# Patient Record
Sex: Female | Born: 1945
Health system: Southern US, Community
[De-identification: ages and names within clinical notes are randomized; demographics above are authoritative.]

## PROBLEM LIST (undated history)

## (undated) DIAGNOSIS — E119 Type 2 diabetes mellitus without complications: Secondary | ICD-10-CM

## (undated) DIAGNOSIS — C801 Malignant (primary) neoplasm, unspecified: Secondary | ICD-10-CM

## (undated) DIAGNOSIS — I10 Essential (primary) hypertension: Secondary | ICD-10-CM

## (undated) HISTORY — DX: Essential (primary) hypertension: I10

## (undated) HISTORY — PX: ABDOMINAL HYSTERECTOMY: SHX81

## (undated) HISTORY — PX: OOPHORECTOMY: SHX86

## (undated) HISTORY — DX: Type 2 diabetes mellitus without complications: E11.9

---

## 2001-06-07 ENCOUNTER — Ambulatory Visit (HOSPITAL_COMMUNITY): Admission: RE | Admit: 2001-06-07 | Discharge: 2001-06-07 | Payer: Self-pay | Admitting: Family Medicine

## 2001-06-07 ENCOUNTER — Encounter: Payer: Self-pay | Admitting: Family Medicine

## 2002-03-15 ENCOUNTER — Ambulatory Visit (HOSPITAL_COMMUNITY): Admission: RE | Admit: 2002-03-15 | Discharge: 2002-03-15 | Payer: Self-pay | Admitting: General Surgery

## 2003-01-21 ENCOUNTER — Ambulatory Visit (HOSPITAL_COMMUNITY): Admission: RE | Admit: 2003-01-21 | Discharge: 2003-01-21 | Payer: Self-pay | Admitting: Family Medicine

## 2003-01-21 ENCOUNTER — Encounter: Payer: Self-pay | Admitting: Family Medicine

## 2006-02-22 ENCOUNTER — Emergency Department (HOSPITAL_COMMUNITY): Admission: EM | Admit: 2006-02-22 | Discharge: 2006-02-23 | Payer: Self-pay | Admitting: Emergency Medicine

## 2006-11-02 ENCOUNTER — Ambulatory Visit (HOSPITAL_COMMUNITY): Admission: RE | Admit: 2006-11-02 | Discharge: 2006-11-02 | Payer: Self-pay | Admitting: Family Medicine

## 2006-11-10 ENCOUNTER — Ambulatory Visit (HOSPITAL_COMMUNITY): Admission: RE | Admit: 2006-11-10 | Discharge: 2006-11-10 | Payer: Self-pay | Admitting: Family Medicine

## 2006-12-07 ENCOUNTER — Ambulatory Visit (HOSPITAL_COMMUNITY): Admission: RE | Admit: 2006-12-07 | Discharge: 2006-12-07 | Payer: Self-pay | Admitting: General Surgery

## 2010-04-08 ENCOUNTER — Ambulatory Visit (HOSPITAL_COMMUNITY): Admission: RE | Admit: 2010-04-08 | Discharge: 2010-04-08 | Payer: Self-pay | Admitting: Family Medicine

## 2010-08-17 ENCOUNTER — Other Ambulatory Visit (HOSPITAL_COMMUNITY): Payer: Self-pay | Admitting: Nephrology

## 2010-08-17 DIAGNOSIS — N289 Disorder of kidney and ureter, unspecified: Secondary | ICD-10-CM

## 2010-09-02 ENCOUNTER — Ambulatory Visit (HOSPITAL_COMMUNITY)
Admission: RE | Admit: 2010-09-02 | Discharge: 2010-09-02 | Disposition: A | Discharge status: Home / Self Care | Payer: BC Managed Care – PPO | Source: Ambulatory Visit | Attending: Nephrology | Admitting: Nephrology

## 2010-09-02 DIAGNOSIS — N289 Disorder of kidney and ureter, unspecified: Secondary | ICD-10-CM | POA: Insufficient documentation

## 2010-10-05 NOTE — H&P (Signed)
[name], [name]                 ACCOUNT NO.:  [id number]   MEDICAL RECORD NO.:  [id number]          PATIENT TYPE:  AMB   LOCATION:                                FACILITY:  [facility]   PHYSICIAN:  Jamesetta So, M.D.  DATE OF BIRTH:  1945-12-16   DATE OF ADMISSION:  12/07/2006  DATE OF DISCHARGE:  LH                              HISTORY & PHYSICAL   CHIEF COMPLAINT:  Family history of colon carcinoma.   HISTORY OF PRESENT ILLNESS:  The patient is a 66 year old white female  who is referred for endoscopic evaluation.  She needs a colonoscopy due  to family history of colon carcinoma in her mother.  Her mother has  since passed away from other causes.  No abdominal pain, weight loss,  nausea, vomiting, diarrhea, constipation, melena, hematochezia have been  noted.  She has never had a colonoscopy.   PAST MEDICAL HISTORY:  1. Hypertension.  2. Non-insulin dependent diabetes mellitus.  3. Hypothyroidism.   PAST SURGICAL HISTORY:  1. Excision of lipoma, coccyx.  2. Hysterectomy with bilateral salpingo-oophorectomy.   CURRENT MEDICATIONS:  Levoxyl, Ziac, Avandia, Glucovance, bisoprolol,  Diovan.   ALLERGIES:  No known drug allergies.   REVIEW OF SYSTEMS:  Noncontributory.   PHYSICAL EXAMINATION:  GENERAL:  The patient is a well-developed, well-  nourished white female in no acute distress.  LUNGS:  Clear to auscultation with equal breath sounds bilaterally.  HEART:  Examination reveals regular rate and rhythm without S3, S4, or  murmurs.  ABDOMEN:  Soft, nontender, nondistended.  No hepatosplenomegaly or  masses noted.  RECTAL:  Examination was deferred to procedure.   IMPRESSION:  Family history of colon carcinoma.   PLAN:  The patient is scheduled for colonoscopy on July, 17, 2008.  The  risks and benefits of the procedure including bleeding and perforation  were fully explained to the patient, gave informed consent.      Jamesetta So, M.D.  Electronically  Signed     MAJ/MEDQ  D:  11/14/2006  T:  11/14/2006  Job:  [id number]   cc:   [facility]  Fax: [phone]   Bonne Dolores, M.D.  Fax: [phone]

## 2010-10-08 NOTE — H&P (Signed)
   NAME:  Courtney [name], Courtney [name] NO.:  [id number]   MEDICAL RECORD NO.:  [id number]                  PATIENT TYPE:   LOCATION:                                       FACILITY:   PHYSICIAN:  Jamesetta So, M.D.               DATE OF BIRTH:  23-Jan-1946   DATE OF ADMISSION:  03/15/2002  DATE OF DISCHARGE:                                HISTORY & PHYSICAL   CHIEF COMPLAINT:  Recurrent lower back mass.   HISTORY OF PRESENT ILLNESS:  The patient is a 65 year old white female who  is referred for evaluation and treatment of a recurrent mass along the lower  portion of her back.  This is over the coccyx region.  She has had a lipoma  removed in the remote past, but the mass has recurred and is larger.  Occasional discomfort is noted.  No drainage has been noted.   PAST MEDICAL HISTORY:  Hypertension and non-insulin-dependent diabetes  mellitus.   PAST SURGICAL HISTORY:  Lipoma excision in the early 1990s, total abdominal  hysterectomy with bilateral salpingo-oophorectomy in the past.   CURRENT MEDICATIONS:  1. Ziac 5/6.25 mg p.o. q.d.  2. Diovan 325 mg p.o. q.d.  3. Glucovance 5/500 two tablets p.o. q.d.  4. Lipitor one tablet p.o. q.d.   ALLERGIES:  No known drug allergies.   REVIEW OF SYSTEMS:  The patient denies drinking or smoking.  She denies any  recent cardiopulmonary difficulties or bleeding disorders.   PHYSICAL EXAMINATION:  GENERAL:  The patient is a morbidly obese white  female in no acute distress.  VITAL SIGNS:  She is afebrile; vital signs are stable.  LUNGS:  Clear to auscultation with equal breath sounds bilaterally.  HEART:  Reveals a regular rate and rhythm without S3, S4, or murmurs.  BACK:  Reveals a large, ovoid, subcutaneous mass with several smaller masses  along the coccyx region.  The large mass is just superior and to the right  of the coccyx.   IMPRESSION:  Recurrent lipoma, back and coccyx region, enlarging.    PLAN:  The  patient is scheduled for excision of the mass, lower back, on  March 15, 2002.  The risks and benefits of the procedure including  bleeding, infection, recurrence of the mass were fully explained to the  patient, who gave informed consent.                                               Jamesetta So, M.D.    MAJ/MEDQ  D:  03/05/2002  T:  03/05/2002  Job:  [id number]   cc:   Leonides Grills, M.D.  [address] [place] [address]  Fax: [phone]

## 2010-10-08 NOTE — Op Note (Signed)
   NAME:  Courtney [name], Courtney [name]                           ACCOUNT NO.:  [id number]   MEDICAL RECORD NO.:  [id number]                   PATIENT TYPE:  AMB   LOCATION:  DAY                                  FACILITY:  [facility]   PHYSICIAN:  Jamesetta So, M.D.               DATE OF BIRTH:  [birth year]   DATE OF PROCEDURE:  03/15/2002  DATE OF DISCHARGE:                                 OPERATIVE REPORT   PREOPERATIVE DIAGNOSIS:  Recurrent lipoma, coccyx, enlarging.   POSTOPERATIVE DIAGNOSIS:  Recurrent lipoma, coccyx, enlarging.   PROCEDURE:  Excision of 8 cm recurrent lipoma, coccyx.   SURGEON:  Jamesetta So, M.D.   ANESTHESIA:  General endotracheal   INDICATIONS:  The patient is a 65 year old white female who is referred for  evaluation and treatment of a recurrent lipoma along the lower portion of  her back.  It appears to be septated in nature.  The patient now presents  for removal as the mass is enlarging.  The risks and benefits of the  procedure including bleeding, infection, and recurrent of the mass were  fully explained to the patient, who gave informed consent.   DESCRIPTION OF PROCEDURE:  The patient was placed in the right lateral  decubitus position after induction of general endotracheal anesthesia.  The  coccyx area was prepped and draped using the usual sterile technique with  Betadine.  Surgical site confirmation was performed.   An elliptical incision was made from the upper portion of the buttock crease  and over the coccyx region.  The extent was approximately 8 cm in size.  Multiple areas of lipomatous tissue were then excised without difficulty.  Any bleeding was controlled using Bovie electrocautery.  The wound was  irrigated with normal saline.  The subcutaneous layer was reapproximated  using a 3-0 Vicryl interrupted suture.  The skin was closed using 3-0 nylon  vertical mattress sutures.  Sensorcaine 0.5% was instilled into the  surrounding wound.  Betadine  ointment and dry sterile dressings were  applied.  All tape and needle counts were correct at the end of the  procedure.  The patient was extubated in the operating room and went back to  recovery room in awake and stable condition.   COMPLICATIONS:  None    SPECIMEN:  Lipoma, coccyx   BLOOD LOSS:  Minimal                                                Jamesetta So, M.D.    MAJ/MEDQ  D:  03/15/2002  T:  03/15/2002  Job:  [id number]   cc:   Leonides Grills, M.D.  [address] [place] [address]  Fax: [phone]

## 2011-04-19 ENCOUNTER — Other Ambulatory Visit (HOSPITAL_COMMUNITY): Payer: Self-pay | Admitting: Internal Medicine

## 2011-04-19 DIAGNOSIS — Z139 Encounter for screening, unspecified: Secondary | ICD-10-CM

## 2011-04-19 DIAGNOSIS — E119 Type 2 diabetes mellitus without complications: Secondary | ICD-10-CM

## 2011-04-22 ENCOUNTER — Ambulatory Visit (HOSPITAL_COMMUNITY)
Admission: RE | Admit: 2011-04-22 | Discharge: 2011-04-22 | Disposition: A | Discharge status: Home / Self Care | Payer: BC Managed Care – PPO | Source: Ambulatory Visit | Attending: Internal Medicine | Admitting: Internal Medicine

## 2011-04-22 DIAGNOSIS — E119 Type 2 diabetes mellitus without complications: Secondary | ICD-10-CM | POA: Insufficient documentation

## 2011-04-22 DIAGNOSIS — Z13820 Encounter for screening for osteoporosis: Secondary | ICD-10-CM | POA: Insufficient documentation

## 2011-04-22 DIAGNOSIS — Z139 Encounter for screening, unspecified: Secondary | ICD-10-CM

## 2011-04-22 DIAGNOSIS — Z780 Asymptomatic menopausal state: Secondary | ICD-10-CM | POA: Insufficient documentation

## 2011-05-10 ENCOUNTER — Other Ambulatory Visit (HOSPITAL_COMMUNITY): Payer: Self-pay | Admitting: Internal Medicine

## 2011-05-10 DIAGNOSIS — Z139 Encounter for screening, unspecified: Secondary | ICD-10-CM

## 2011-05-13 ENCOUNTER — Ambulatory Visit (HOSPITAL_COMMUNITY)
Admission: RE | Admit: 2011-05-13 | Discharge: 2011-05-13 | Disposition: A | Discharge status: Home / Self Care | Payer: BC Managed Care – PPO | Source: Ambulatory Visit | Attending: Internal Medicine | Admitting: Internal Medicine

## 2011-05-13 DIAGNOSIS — Z139 Encounter for screening, unspecified: Secondary | ICD-10-CM

## 2011-05-13 DIAGNOSIS — Z1231 Encounter for screening mammogram for malignant neoplasm of breast: Secondary | ICD-10-CM | POA: Insufficient documentation

## 2013-01-10 ENCOUNTER — Other Ambulatory Visit (HOSPITAL_COMMUNITY): Payer: Self-pay | Admitting: Family Medicine

## 2013-01-10 DIAGNOSIS — Z139 Encounter for screening, unspecified: Secondary | ICD-10-CM

## 2013-01-11 ENCOUNTER — Ambulatory Visit (HOSPITAL_COMMUNITY)
Admission: RE | Admit: 2013-01-11 | Discharge: 2013-01-11 | Disposition: A | Discharge status: Home / Self Care | Payer: Managed Care, Other (non HMO) | Source: Ambulatory Visit | Attending: Family Medicine | Admitting: Family Medicine

## 2013-01-11 DIAGNOSIS — Z1231 Encounter for screening mammogram for malignant neoplasm of breast: Secondary | ICD-10-CM | POA: Insufficient documentation

## 2013-01-11 DIAGNOSIS — Z139 Encounter for screening, unspecified: Secondary | ICD-10-CM

## 2013-01-23 ENCOUNTER — Ambulatory Visit (INDEPENDENT_AMBULATORY_CARE_PROVIDER_SITE_OTHER): Payer: Managed Care, Other (non HMO)

## 2013-01-23 ENCOUNTER — Ambulatory Visit (INDEPENDENT_AMBULATORY_CARE_PROVIDER_SITE_OTHER): Payer: Managed Care, Other (non HMO) | Admitting: Orthopedic Surgery

## 2013-01-23 ENCOUNTER — Encounter: Payer: Self-pay | Admitting: Orthopedic Surgery

## 2013-01-23 VITALS — BP 139/78 | Ht 61.0 in | Wt 212.0 lb

## 2013-01-23 DIAGNOSIS — M25562 Pain in left knee: Secondary | ICD-10-CM

## 2013-01-23 DIAGNOSIS — M1710 Unilateral primary osteoarthritis, unspecified knee: Secondary | ICD-10-CM

## 2013-01-23 DIAGNOSIS — M25569 Pain in unspecified knee: Secondary | ICD-10-CM

## 2013-01-23 DIAGNOSIS — M1712 Unilateral primary osteoarthritis, left knee: Secondary | ICD-10-CM | POA: Insufficient documentation

## 2013-01-23 MED ORDER — DICLOFENAC POTASSIUM 50 MG PO TABS: 50.0000 mg | ORAL_TABLET | Freq: Two times a day (BID) | ORAL | Status: DC

## 2013-01-23 MED ORDER — ACETAMINOPHEN-CODEINE #3 300-30 MG PO TABS: 1.0000 | ORAL_TABLET | Freq: Four times a day (QID) | ORAL | Status: DC | PRN

## 2013-01-23 NOTE — Progress Notes (Signed)
Patient ID: Courtney [name], female   DOB: [birth year], 67 y.o.   MRN: [id number] Chief Complaint  Patient presents with  . Knee Pain    Left knee pain   HISTORY: This is a 67 year old female who had a knee arthroscopy in the 90s on her left knee now complains of ongoing left knee pain for the last 14 years. She complains of dull throbbing 3-4/10 constant pain somewhat improved with Aleve. She has difficulty walking and difficulty going up and down the stairs the knees with swelling times. She notices popping catching anterior and posterior joint pain with decreased walking speed and difficulty getting out of a chair. Her review of systems seasonal allergies all other systems were reviewed were negative.  Past Medical History  Diagnosis Date  . Diabetes   . Hypertension    Past Surgical History  Procedure Laterality Date  . Abdominal hysterectomy    . Oophorectomy     The past, family history and social history have been reviewed and are recorded in the corresponding sections of epic   VS BP 139/78  Ht 5\' 1"  (1.549 m)  Wt 212 lb (96.163 kg)  BMI 40.08 kg/m2  General and hygiene are normal. Development and nutrition are normal. Body habitus obesity Mood Affect are normal The patient is alert and oriented x3 Ambulatory status no assistive devices but she does have an antalgic gait  RUE (include skin) Inspection reveals no tenderness or swelling,  range of motion is normal. The joints are located without subluxation or laxity. Motor exam reveals grade 5 strength and the skin is without rash lesion or ulcer  LUE Inspection reveals no tenderness or swelling,  range of motion is normal. The joints are located without subluxation or laxity. Motor exam reveals grade 5 strength;  skin is without rash lesion or ulcer  RLE Inspection reveals mild lateral joint line tenderness with continued functional range of motion. No subluxation or laxity strength normal skin without rash she does have  some lower extremity tenderness and ankle swelling   LLE Inspection reveals medial and lateral joint line tenderness no effusion functional range of motion no laxity skin normal without rash although there is some tenderness, strength normal   CDV peripheral pulses are intact without swelling or varicose veins  LYMPH are normal in all 4 extremities with no palpable nodes  DTR are equal and symmetric Balance  is normal  X-ray of the left knee show severe arthritis medial compartment  Osteoarthritis left knee  Inject left knee Start arthritis medication Use Tylenol with codeine for pain Patient education  Return 6 weeks  Knee  Injection Procedure Note  Pre-operative Diagnosis: left knee oa  Post-operative Diagnosis: same  Indications: pain  Anesthesia: ethyl chloride   Procedure Details   Verbal consent was obtained for the procedure. Time out was completed.The joint was prepped with alcohol, followed by  Ethyl chloride spray and A 20 gauge needle was inserted into the knee via lateral approach; 62ml 1% lidocaine and 1 ml of depomedrol  was then injected into the joint . The needle was removed and the area cleansed and dressed.  Complications:  None; patient tolerated the procedure well.

## 2013-01-23 NOTE — Patient Instructions (Addendum)
You have received a steroid shot. 15% of patients experience increased pain at the injection site with in the next 24 hours. This is best treated with ice and tylenol extra strength 2 tabs every 8 hours. If you are still having pain please call the office.   Wear and Tear Disorders of the Knee (Arthritis, Osteoarthritis) Everyone will experience wear and tear injuries (arthritis, osteoarthritis) of the knee. These are the changes we all get as we age. They come from the joint stress of daily living. The amount of cartilage damage in your knee and your symptoms determine if you need surgery. Mild problems require approximately two months recovery time. More severe problems take several months to recover. With mild problems, your surgeon may find worn and rough cartilage surfaces. With severe changes, your surgeon may find cartilage that has completely worn away and exposed the bone. Loose bodies of bone and cartilage, bone spurs (excess bone growth), and injuries to the menisci (cushions between the large bones of your leg) are also common. All of these problems can cause pain. For a mild wear and tear problem, rough cartilage may simply need to be shaved and smoothed. For more severe problems with areas of exposed bone, your surgeon may use an instrument for roughing up the bone surfaces to stimulate new cartilage growth. Loose bodies are usually removed. Torn menisci may be trimmed or repaired. ABOUT THE ARTHROSCOPIC PROCEDURE Arthroscopy is a surgical technique. It allows your orthopedic surgeon to diagnose and treat your knee injury with accuracy. The surgeon looks into your knee through a small scope. The scope is like a small (pencil-sized) telescope. Arthroscopy is less invasive than open knee surgery. You can expect a more rapid recovery. After the procedure, you will be moved to a recovery area until most of the effects of the medication have worn off. Your caregiver will discuss the test results with  you. RECOVERY The severity of the arthritis and the type of procedure performed will determine recovery time. Other important factors include age, physical condition, medical conditions, and the type of rehabilitation program. Strengthening your muscles after arthroscopy helps guarantee a better recovery. Follow your caregiver's instructions. Use crutches, rest, elevate, ice, and do knee exercises as instructed. Your caregivers will help you and instruct you with exercises and other physical therapy required to regain your mobility, muscle strength, and functioning following surgery. Only take over-the-counter or prescription medicines for pain, discomfort, or fever as directed by your caregiver.  SEEK MEDICAL CARE IF:   There is increased bleeding (more than a small spot) from the wound.  You notice redness, swelling, or increasing pain in the wound.  Pus is coming from wound.  You develop an unexplained oral temperature above 102 F (38.9 C) , or as your caregiver suggests.  You notice a foul smell coming from the wound or dressing.  You have severe pain with motion of the knee. SEEK IMMEDIATE MEDICAL CARE IF:   You develop a rash.  You have difficulty breathing.  You have any allergic problems. MAKE SURE YOU:   Understand these instructions.  Will watch your condition.  Will get help right away if you are not doing well or get worse. Document Released: 05/06/2000 Document Revised: 08/01/2011 Document Reviewed: 10/03/2007 [facility] Patient Information 2014 [place], [place].

## 2013-03-07 ENCOUNTER — Ambulatory Visit: Payer: Managed Care, Other (non HMO) | Admitting: Orthopedic Surgery

## 2013-03-14 ENCOUNTER — Ambulatory Visit (INDEPENDENT_AMBULATORY_CARE_PROVIDER_SITE_OTHER): Payer: Managed Care, Other (non HMO) | Admitting: Orthopedic Surgery

## 2013-03-14 ENCOUNTER — Encounter: Payer: Self-pay | Admitting: Orthopedic Surgery

## 2013-03-14 VITALS — BP 130/77 | Ht 61.0 in | Wt 212.0 lb

## 2013-03-14 DIAGNOSIS — M1710 Unilateral primary osteoarthritis, unspecified knee: Secondary | ICD-10-CM

## 2013-03-14 DIAGNOSIS — M25569 Pain in unspecified knee: Secondary | ICD-10-CM

## 2013-03-14 DIAGNOSIS — M25562 Pain in left knee: Secondary | ICD-10-CM

## 2013-03-14 DIAGNOSIS — M1712 Unilateral primary osteoarthritis, left knee: Secondary | ICD-10-CM

## 2013-03-14 MED ORDER — ACETAMINOPHEN-CODEINE #3 300-30 MG PO TABS: 1.0000 | ORAL_TABLET | Freq: Four times a day (QID) | ORAL | Status: DC | PRN

## 2013-03-14 NOTE — Progress Notes (Signed)
Patient ID: Courtney [name], female   DOB: 1946/04/03, 67 y.o.   MRN: [id number]  Chief Complaint  Patient presents with  . Follow-up    6 week recheck left knee s/p injection    This patient had an injection in her left knee she reports significant improvement although not complete resolution of pain.  Review of systems is negative for any knee related complaints  Blood pressure 130/77, height 5\' 1"  (1.549 m), weight 212 lb (96.163 kg). General appearance is normal, the patient is alert and oriented x3 with normal mood and affect. The left knee is nontender today her range of motion is approximately 115, her thigh circumference blocks flexion. Her knee is stable.  The distal swelling is still present but without redness  She will continue on diclofenac and Tylenol #3 followup as needed for injections as needed

## 2013-05-23 DIAGNOSIS — F0280 Dementia in other diseases classified elsewhere without behavioral disturbance: Secondary | ICD-10-CM

## 2013-05-23 HISTORY — DX: Dementia in other diseases classified elsewhere, unspecified severity, without behavioral disturbance, psychotic disturbance, mood disturbance, and anxiety: F02.80

## 2013-06-24 ENCOUNTER — Other Ambulatory Visit: Payer: Self-pay | Admitting: *Deleted

## 2013-06-24 DIAGNOSIS — M25562 Pain in left knee: Secondary | ICD-10-CM

## 2013-06-24 MED ORDER — DICLOFENAC POTASSIUM 50 MG PO TABS: 50.0000 mg | ORAL_TABLET | Freq: Two times a day (BID) | ORAL | Status: DC

## 2014-04-24 ENCOUNTER — Emergency Department (HOSPITAL_COMMUNITY)
Admission: EM | Admit: 2014-04-24 | Discharge: 2014-04-24 | Disposition: A | Discharge status: Home / Self Care | Payer: Managed Care, Other (non HMO) | Attending: Emergency Medicine | Admitting: Emergency Medicine

## 2014-04-24 ENCOUNTER — Emergency Department (HOSPITAL_COMMUNITY): Payer: Managed Care, Other (non HMO)

## 2014-04-24 ENCOUNTER — Encounter (HOSPITAL_COMMUNITY): Payer: Self-pay | Admitting: Cardiology

## 2014-04-24 DIAGNOSIS — R4182 Altered mental status, unspecified: Secondary | ICD-10-CM | POA: Diagnosis present

## 2014-04-24 DIAGNOSIS — Z791 Long term (current) use of non-steroidal anti-inflammatories (NSAID): Secondary | ICD-10-CM | POA: Insufficient documentation

## 2014-04-24 DIAGNOSIS — Z79899 Other long term (current) drug therapy: Secondary | ICD-10-CM | POA: Insufficient documentation

## 2014-04-24 DIAGNOSIS — E119 Type 2 diabetes mellitus without complications: Secondary | ICD-10-CM | POA: Diagnosis not present

## 2014-04-24 DIAGNOSIS — R413 Other amnesia: Secondary | ICD-10-CM | POA: Diagnosis not present

## 2014-04-24 DIAGNOSIS — Z794 Long term (current) use of insulin: Secondary | ICD-10-CM | POA: Insufficient documentation

## 2014-04-24 DIAGNOSIS — I10 Essential (primary) hypertension: Secondary | ICD-10-CM | POA: Diagnosis not present

## 2014-04-24 DIAGNOSIS — D649 Anemia, unspecified: Secondary | ICD-10-CM | POA: Diagnosis not present

## 2014-04-24 LAB — CBC WITH DIFFERENTIAL/PLATELET
Basophils Absolute: 0.1 10*3/uL (ref 0.0–0.1)
Basophils Relative: 1 % (ref 0–1)
EOS PCT: 2 % (ref 0–5)
Eosinophils Absolute: 0.2 10*3/uL (ref 0.0–0.7)
HEMATOCRIT: 30.3 % — AB (ref 36.0–46.0)
Hemoglobin: 9.6 g/dL — ABNORMAL LOW (ref 12.0–15.0)
LYMPHS ABS: 1.7 10*3/uL (ref 0.7–4.0)
LYMPHS PCT: 20 % (ref 12–46)
MCH: 26.2 pg (ref 26.0–34.0)
MCHC: 31.7 g/dL (ref 30.0–36.0)
MCV: 82.8 fL (ref 78.0–100.0)
MONO ABS: 0.4 10*3/uL (ref 0.1–1.0)
Monocytes Relative: 5 % (ref 3–12)
Neutro Abs: 6.1 10*3/uL (ref 1.7–7.7)
Neutrophils Relative %: 72 % (ref 43–77)
PLATELETS: 267 10*3/uL (ref 150–400)
RBC: 3.66 MIL/uL — AB (ref 3.87–5.11)
RDW: 15.7 % — ABNORMAL HIGH (ref 11.5–15.5)
WBC: 8.5 10*3/uL (ref 4.0–10.5)

## 2014-04-24 LAB — COMPREHENSIVE METABOLIC PANEL
ALT: 19 U/L (ref 0–35)
ANION GAP: 12 (ref 5–15)
AST: 24 U/L (ref 0–37)
Albumin: 3.8 g/dL (ref 3.5–5.2)
Alkaline Phosphatase: 62 U/L (ref 39–117)
BILIRUBIN TOTAL: 0.2 mg/dL — AB (ref 0.3–1.2)
BUN: 25 mg/dL — ABNORMAL HIGH (ref 6–23)
CALCIUM: 8.9 mg/dL (ref 8.4–10.5)
CHLORIDE: 102 meq/L (ref 96–112)
CO2: 25 meq/L (ref 19–32)
Creatinine, Ser: 1.18 mg/dL — ABNORMAL HIGH (ref 0.50–1.10)
GFR, EST AFRICAN AMERICAN: 54 mL/min — AB (ref >90)
GFR, EST NON AFRICAN AMERICAN: 46 mL/min — AB (ref >90)
GLUCOSE: 179 mg/dL — AB (ref 70–99)
Potassium: 4.7 mEq/L (ref 3.7–5.3)
SODIUM: 139 meq/L (ref 137–147)
Total Protein: 7.2 g/dL (ref 6.0–8.3)

## 2014-04-24 LAB — AMMONIA: AMMONIA: 28 umol/L (ref 11–60)

## 2014-04-24 LAB — URINE MICROSCOPIC-ADD ON

## 2014-04-24 LAB — URINALYSIS, ROUTINE W REFLEX MICROSCOPIC
Bilirubin Urine: NEGATIVE
GLUCOSE, UA: NEGATIVE mg/dL
Ketones, ur: NEGATIVE mg/dL
NITRITE: NEGATIVE
PH: 7 (ref 5.0–8.0)
Protein, ur: NEGATIVE mg/dL
SPECIFIC GRAVITY, URINE: 1.02 (ref 1.005–1.030)
Urobilinogen, UA: 0.2 mg/dL (ref 0.0–1.0)

## 2014-04-24 LAB — CBG MONITORING, ED: Glucose-Capillary: 188 mg/dL — ABNORMAL HIGH (ref 70–99)

## 2014-04-24 LAB — TROPONIN I

## 2014-04-24 NOTE — ED Notes (Signed)
Neuro exam normal.  Alert and oriented.   When speaking with pt ,  She says random things.

## 2014-04-24 NOTE — Discharge Instructions (Signed)
Please call your doctor for a followup appointment within 24-48 hours. When you talk to your doctor please let them know that you were seen in the emergency department and have them acquire all of your records so that they can discuss the findings with you and formulate a treatment plan to fully care for your new and ongoing problems. ° °

## 2014-04-24 NOTE — ED Notes (Addendum)
Pt has been disoriented since last night.   Seen at [facility] Tuesday for a regular check up and was told her thyroid function was really high.   Received flu and pneumonia  Vaccine  Tuesday.

## 2014-04-24 NOTE — ED Provider Notes (Signed)
CSN: [id number]     Arrival date & time 04/24/14  1223 History  This chart was scribed for Courtney Acosta, MD by Courtney [name], ED Scribe. This patient was seen in room APA09/APA09 and the patient's care was started at 1:05 PM.    Chief Complaint  Patient presents with  . Altered Mental Status   The history is provided by the patient, the spouse and a relative. No language interpreter was used.     HPI Comments: Courtney [name] is a 68 y.o. female who presents to the Emergency Department complaining of altered mental status; confusion and memory loss, seemingly worsening over the past few days. Patient reports tingling and itching in her hands.  Ambulating without issue. She denies fevers, chills, nausea, vomiting, rashes.   Relative explains that patient had prior thyroid complications and was placed on meds for this issue 1 year PTA. Her memory loss and confusion has increased over the past year; PCP upped thyroid med dosage recently. Her memory and confusion has worsened acutely over the past few days. Husband reports an incident of abnormal behavior this morning: the patient was urinating in the floor and around the toilet, looking at her husband as though she didn't recognize him, refused to dress, unable to recognize the christmas tree or other normal household items.   She is currently taking Synthroid 125mg ; she does not take this every day. Last filled mid November.   PCP is Courtney Morel, MD  Past Medical History  Diagnosis Date  . Diabetes   . Hypertension    Past Surgical History  Procedure Laterality Date  . Abdominal hysterectomy    . Oophorectomy     History reviewed. No pertinent family history. History  Substance Use Topics  . Smoking status: Unknown If Ever Smoked  . Smokeless tobacco: Not on file  . Alcohol Use: No   OB History    No data available     Review of Systems  Neurological:       Memory loss and abnormal behavior   All other systems  reviewed and are negative.   Allergies  Review of patient's allergies indicates no known allergies.  Home Medications   Prior to Admission medications   Medication Sig Start Date End Date Taking? Authorizing Provider  amLODipine (NORVASC) 10 MG tablet Take 5 mg by mouth daily.    Yes Historical Provider, MD  Aspirin-Salicylamide-Caffeine (BC HEADACHE POWDER PO) Take 2 Packages by mouth daily as needed (headache).   Yes Historical Provider, MD  baclofen (LIORESAL) 20 MG tablet Take 20 mg by mouth at bedtime as needed for muscle spasms.   Yes Historical Provider, MD  DiphenhydrAMINE HCl [facility] RELIEF EX) Apply 1 application topically daily as needed (itching).   Yes Historical Provider, MD  furosemide (LASIX) 20 MG tablet Take 20 mg by mouth.   Yes Historical Provider, MD  insulin glargine (LANTUS) 100 UNIT/ML injection Inject 60 Units into the skin at bedtime.    Yes Historical Provider, MD  levothyroxine (SYNTHROID, LEVOTHROID) 125 MCG tablet Take 125 mcg by mouth daily before breakfast.   Yes Historical Provider, MD  naproxen sodium (ANAPROX) 220 MG tablet Take 440 mg by mouth daily as needed (headache).   Yes Historical Provider, MD  simvastatin (ZOCOR) 10 MG tablet Take 10 mg by mouth at bedtime.   Yes Historical Provider, MD  sitaGLIPtin-metformin (JANUMET) 50-1000 MG per tablet Take 1 tablet by mouth 2 (two) times daily with a meal.  Yes Historical Provider, MD  valsartan (DIOVAN) 320 MG tablet Take 320 mg by mouth daily.   Yes Historical Provider, MD  acetaminophen-codeine (TYLENOL #3) 300-30 MG per tablet Take 1 tablet by mouth every 6 (six) hours as needed for pain. Patient not taking: Reported on 04/24/2014 03/14/13   Carole Civil, MD  bisoprolol-hydrochlorothiazide [facility]) 2.5-6.25 MG per tablet Take 1 tablet by mouth daily.    Historical Provider, MD  diclofenac (CATAFLAM) 50 MG tablet Take 1 tablet (50 mg total) by mouth 2 (two) times daily. Patient not taking: Reported on  04/24/2014 06/24/13   Carole Civil, MD   BP 149/73 mmHg  Pulse 90  Temp(Src) 98.6 F (37 C) (Oral)  Resp 20  Ht 5\' 2"  (1.575 m)  Wt 230 lb (104.327 kg)  BMI 42.06 kg/m2  SpO2 97% Physical Exam  Constitutional: She is oriented to person, place, and time. She appears well-developed and well-nourished. No distress.  Tearful  HENT:  Head: Normocephalic and atraumatic.  Mouth/Throat: Oropharynx is clear and moist. No oropharyngeal exudate.  Eyes: Conjunctivae and EOM are normal. Pupils are equal, round, and reactive to light. Right eye exhibits no discharge. Left eye exhibits no discharge. No scleral icterus.  Neck: Normal range of motion. Neck supple. No JVD present. No tracheal deviation present. No thyromegaly present.  Cardiovascular: Normal rate, regular rhythm, normal heart sounds and intact distal pulses.  Exam reveals no gallop and no friction rub.   No murmur heard. Pulmonary/Chest: Effort normal and breath sounds normal. No respiratory distress. She has no wheezes. She has no rales.  Abdominal: Soft. Bowel sounds are normal. She exhibits no distension and no mass. There is no tenderness.  Musculoskeletal: Normal range of motion. She exhibits no edema or tenderness.  Lymphadenopathy:    She has no cervical adenopathy.  Neurological: She is alert and oriented to person, place, and time. Coordination normal.  Has difficulty answering basic questions but follows commands perfectly.  She has normal coordination, she has normal CN 3-12 and normal strength in all 4 extremities without tremor - she is able to tell me her name, the month and location and birthday.  Skin: Skin is warm and dry. No rash noted. No erythema.  Psychiatric: She has a normal mood and affect. Her behavior is normal.  Nursing note and vitals reviewed.   ED Course  Procedures   DIAGNOSTIC STUDIES: Oxygen Saturation is 97% on RA, adequate by my interpretation.    COORDINATION OF CARE: 1:15 PM Discussed  treatment plan with pt at bedside and pt agreed to plan.   Labs Review Labs Reviewed  CBC WITH DIFFERENTIAL - Abnormal; Notable for the following:    RBC 3.66 (*)    Hemoglobin 9.6 (*)    HCT 30.3 (*)    RDW 15.7 (*)    All other components within normal limits  COMPREHENSIVE METABOLIC PANEL - Abnormal; Notable for the following:    Glucose, Bld 179 (*)    BUN 25 (*)    Creatinine, Ser 1.18 (*)    Total Bilirubin 0.2 (*)    GFR calc non Af Amer 46 (*)    GFR calc Af Amer 54 (*)    All other components within normal limits  URINALYSIS, ROUTINE W REFLEX MICROSCOPIC - Abnormal; Notable for the following:    Hgb urine dipstick TRACE (*)    Leukocytes, UA TRACE (*)    All other components within normal limits  URINE MICROSCOPIC-ADD ON - Abnormal; Notable  for the following:    Squamous Epithelial / LPF FEW (*)    All other components within normal limits  CBG MONITORING, ED - Abnormal; Notable for the following:    Glucose-Capillary 188 (*)    All other components within normal limits  AMMONIA  TROPONIN I  TSH  CBG MONITORING, ED    Imaging Review Ct Head Wo Contrast  04/24/2014   CLINICAL DATA:  Altered mental status since last night.  EXAM: CT HEAD WITHOUT CONTRAST  TECHNIQUE: Contiguous axial images were obtained from the base of the skull through the vertex without intravenous contrast.  COMPARISON:  None.  FINDINGS: No mass lesion. No midline shift. No acute hemorrhage or hematoma. No extra-axial fluid collections. No evidence of acute infarction. Brain parenchyma is normal. Osseous structures are normal.  IMPRESSION: Normal exam.   Electronically Signed   By: Rozetta Nunnery M.D.   On: 04/24/2014 14:21     EKG Interpretation   Date/Time:  Thursday April 24 2014 13:24:29 EST Ventricular Rate:  83 PR Interval:  137 QRS Duration: 78 QT Interval:  386 QTC Calculation: 453 R Axis:   69 Text Interpretation:  Sinus rhythm Low voltage, precordial leads ECG  OTHERWISE WITHIN  NORMAL LIMITS No old tracing to compare Confirmed by  Hammad Finkler  MD, Earnie Rockhold ([id number]) on 04/24/2014 2:04:46 PM      MDM   Final diagnoses:  Memory loss  Anemia, unspecified anemia type    Labs have been very reassuring, she has a normal head CT and ECG without acute findings - she has had a stable exam while here without any findings to suggest need for emergent w/u or admission - I have discussed the pt's care with her PMD - Dr. Ethlyn Gallery, Kassie Mends, MD Who has agreed to make a neurology referral and to follow up with her in the office - she reports the TSH at 9 which was drawn this week.  Her condition is not related to her thyroid.  familiy and pt agreeable to plan.  Meds given in ED:  I personally performed the services described in this documentation, which was scribed in my presence. The recorded information has been reviewed and is accurate.        Courtney Acosta, MD 04/24/14 [phone]

## 2014-04-25 LAB — TSH: TSH: 6.24 u[IU]/mL — ABNORMAL HIGH (ref 0.350–4.500)

## 2014-04-29 ENCOUNTER — Encounter: Payer: Self-pay | Admitting: Neurology

## 2014-04-29 ENCOUNTER — Ambulatory Visit (INDEPENDENT_AMBULATORY_CARE_PROVIDER_SITE_OTHER): Payer: Managed Care, Other (non HMO) | Admitting: Neurology

## 2014-04-29 VITALS — BP 160/96 | HR 84 | Temp 98.4°F | Resp 20 | Ht 60.0 in | Wt 226.1 lb

## 2014-04-29 DIAGNOSIS — R4189 Other symptoms and signs involving cognitive functions and awareness: Secondary | ICD-10-CM

## 2014-04-29 DIAGNOSIS — F482 Pseudobulbar affect: Secondary | ICD-10-CM

## 2014-04-29 DIAGNOSIS — R4182 Altered mental status, unspecified: Secondary | ICD-10-CM

## 2014-04-29 LAB — VITAMIN B12: VITAMIN B 12: 334 pg/mL (ref 211–911)

## 2014-04-29 NOTE — Patient Instructions (Addendum)
1.  We will check an MRI of the brain [place]  9:10 am  2.  We will check B12 and methylmalonic acid level 3.  We will check EEG 4.  If tests unremarkable, will likely get neuropsychological testing 5.  Follow up after neuropsychological testing or in 2 months if we don't pursue testing.

## 2014-04-29 NOTE — Progress Notes (Signed)
NEUROLOGY CONSULTATION NOTE  Courtney [name] MRN: [id number] DOB: [birth year]  Referring provider: Dr. Ethlyn Gallery Primary care provider: Dr. Ethlyn Gallery  Reason for consult:  Cognitive dysfunction  HISTORY OF PRESENT ILLNESS: Courtney [name] is a 68 year old right-handed woman with hypothyroidism, diabetes and hypertension who presents for cognitive impairment.  Records, labs and CT were reviewed.  She is accompanied by her husband who also provides some history.  She presented to the ED at [facility] on 04/24/14 for bizarre behavior.  She woke up that morning, later than usual.  She and her husband sleep in different bedrooms.  She went to her husband's bathroom to use the toilet, which was unusual.  The night before, they but up the Christmas tree and left the paper which wrapped the ornaments on the floor.  That morning, she asked him where the paper came from.  After she went back to her room, her husband later found her on the toilet but noticed she had urinated on the bedroom floor.  When he asked her what was wrong, she looked up at him confused and didn't know what he was taking about.   She does recall the events of this morning, but it is a little fuzzy.  She did not bite her tongue and there is no indication that she experienced convulsions.  Labs, including CBC, CMP, UA, troponins, and ammonia were unremarkable.  TSH was 6.240.  CT of the head was normal.    She has had a change in behavior over the past year.  She started feeling cold all the time.  This past spring, she ran out of Synthroid and didn't have it refilled.  She was found to have a very elevated TSH and her Synthroid was restarted with dose adjustment.  She also easily will start crying at any little thing.  She doesn't feel profoundly sad when it occurs.  She can't help it from happening.  Her husband was undergoing daily radiation treatments for prostate cancer.  One day, when he came home from a routine treatment, she asked him  where he was.  However, she maintains that she asked him only because it seemed like he got home later from the treatment than usual.  She does not have trouble recognizing faces but she sometimes has trouble recalling names.  She does not have hallucinations or delusions.  She is able to perform all of her ADLs.  She does report some depression, related to her son who is an alcoholic, but this is a longstanding problem.  She hasn't driven for several years due to anxiety.  Her granddaughter is irritated with her because she is unwilling to drive and pick up the great-granddaughter from daycare due to her anxiety.  She is currently at baseline.  She has no history of seizure or head injury.  She has no known family history of dementia.    PAST MEDICAL HISTORY: Past Medical History  Diagnosis Date  . Diabetes   . Hypertension     PAST SURGICAL HISTORY: Past Surgical History  Procedure Laterality Date  . Abdominal hysterectomy    . Oophorectomy      MEDICATIONS: Current Outpatient Prescriptions on File Prior to Visit  Medication Sig Dispense Refill  . acetaminophen-codeine (TYLENOL #3) 300-30 MG per tablet Take 1 tablet by mouth every 6 (six) hours as needed for pain. 60 tablet 0  . amLODipine (NORVASC) 10 MG tablet Take 5 mg by mouth daily.     . Aspirin-Salicylamide-Caffeine (  BC HEADACHE POWDER PO) Take 2 Packages by mouth daily as needed (headache).    . baclofen (LIORESAL) 20 MG tablet Take 20 mg by mouth at bedtime as needed for muscle spasms.    . bisoprolol-hydrochlorothiazide (ZIAC) 2.5-6.25 MG per tablet Take 1 tablet by mouth daily.    . diclofenac (CATAFLAM) 50 MG tablet Take 1 tablet (50 mg total) by mouth 2 (two) times daily. 90 tablet 5  . DiphenhydrAMINE HCl [facility] RELIEF EX) Apply 1 application topically daily as needed (itching).    . furosemide (LASIX) 20 MG tablet Take 20 mg by mouth.    . insulin glargine (LANTUS) 100 UNIT/ML injection Inject 60 Units into the skin at  bedtime.     Marland Kitchen levothyroxine (SYNTHROID, LEVOTHROID) 125 MCG tablet Take 125 mcg by mouth daily before breakfast.    . naproxen sodium (ANAPROX) 220 MG tablet Take 440 mg by mouth daily as needed (headache).    . simvastatin (ZOCOR) 10 MG tablet Take 10 mg by mouth at bedtime.    . sitaGLIPtin-metformin (JANUMET) 50-1000 MG per tablet Take 1 tablet by mouth 2 (two) times daily with a meal.    . valsartan (DIOVAN) 320 MG tablet Take 320 mg by mouth daily.     No current facility-administered medications on file prior to visit.    ALLERGIES: No Known Allergies  FAMILY HISTORY: Family History  Problem Relation Age of Onset  . Rheum arthritis Mother   . Heart failure Mother   . Hypertension Mother   . Stroke Father   . Diabetes Brother   . Diabetes Sister   . Hypertension Maternal Grandmother     SOCIAL HISTORY: History   Social History  . Marital Status: Married    Spouse Name: N/A    Number of Children: N/A  . Years of Education: N/A   Occupational History  . Not on file.   Social History Main Topics  . Smoking status: Former Research scientist (life sciences)  . Smokeless tobacco: Never Used  . Alcohol Use: 0.0 oz/week    0 Not specified per week     Comment: social  . Drug Use: No  . Sexual Activity: No   Other Topics Concern  . Not on file   Social History Narrative    REVIEW OF SYSTEMS: Constitutional: No fevers, chills, or sweats, no generalized fatigue, change in appetite Eyes: No visual changes, double vision, eye pain Ear, nose and throat: No hearing loss, ear pain, nasal congestion, sore throat Cardiovascular: No chest pain, palpitations Respiratory:  No shortness of breath at rest or with exertion, wheezes GastrointestinaI: No nausea, vomiting, diarrhea, abdominal pain, fecal incontinence Genitourinary:  No dysuria, urinary retention or frequency Musculoskeletal:  No neck pain, back pain Integumentary: No rash, pruritus, skin lesions Neurological: as above Psychiatric: No  depression, insomnia, anxiety Endocrine: No palpitations, fatigue, diaphoresis, mood swings, change in appetite, change in weight, increased thirst Hematologic/Lymphatic:  No anemia, purpura, petechiae. Allergic/Immunologic: no itchy/runny eyes, nasal congestion, recent allergic reactions, rashes  PHYSICAL EXAM: Filed Vitals:   04/29/14 0758  BP: 160/96  Pulse: 84  Temp: 98.4 F (36.9 C)  Resp: 20   General: No acute distress Head:  Normocephalic/atraumatic Eyes:  fundi unremarkable, without vessel changes, exudates, hemorrhages or papilledema. Neck: supple, no paraspinal tenderness, full range of motion Back: No paraspinal tenderness Heart: regular rate and rhythm Lungs: Clear to auscultation bilaterally. Vascular: No carotid bruits. Neurological Exam: Mental status: alert and oriented to person, place, and time, delayed recall poor,  remote memory intact, fund of knowledge intact, attention and concentration intact but could not perform serial 7 subtraction, unable to perform trail making test or copy a cube, able to draw a clock, speech fluent and not dysarthric, language intact although did not repeat exactly word-for-word. Montreal Cognitive Assessment  04/29/2014  Visuospatial/ Executive (0/5) 2  Naming (0/3) 3  Attention: Read list of digits (0/2) 2  Attention: Read list of letters (0/1) 1  Attention: Serial 7 subtraction starting at 100 (0/3) 1  Language: Repeat phrase (0/2) 0  Language : Fluency (0/1) 0  Abstraction (0/2) 1  Delayed Recall (0/5) 0  Orientation (0/6) 6  Total 16  Adjusted Score (based on education) 17   Cranial nerves: CN I: not tested CN II: pupils equal, round and reactive to light, visual fields intact, fundi unremarkable, without vessel changes, exudates, hemorrhages or papilledema. CN III, IV, VI:  full range of motion, no nystagmus, no ptosis CN V: facial sensation intact CN VII: upper and lower face symmetric CN VIII: hearing intact CN IX, X:  gag intact, uvula midline CN XI: sternocleidomastoid and trapezius muscles intact CN XII: tongue midline Bulk & Tone: normal, no fasciculations. Motor:  5/5 throughout Sensation:  Reduced temperature sensation in the feet.  Mildly reduced vibration sensation in the toes. Deep Tendon Reflexes:  2+ throughout except absent in the ankles.  Toes downgoing. Finger to nose testing:  No dysmetria Gait:  Normal station and stride.  Able to turn and walk in tandem although a little unsteady. Romberg negative.  IMPRESSION: Transient altered mental status.   Cognitive impairment Pseudobulbar affect  PLAN: 1.  MRI of brain  2.  B12 3.  EEG 4.  Will likely refer for formal neuropsychological testing following above tests  Thank you for allowing me to take part in the care of this patient.  Metta Clines, DO  CC: Micheline Rough, MD

## 2014-05-01 ENCOUNTER — Ambulatory Visit
Admission: RE | Admit: 2014-05-01 | Discharge: 2014-05-01 | Disposition: A | Discharge status: Home / Self Care | Payer: Medicare Other | Source: Ambulatory Visit | Attending: Neurology | Admitting: Neurology

## 2014-05-01 DIAGNOSIS — R4182 Altered mental status, unspecified: Secondary | ICD-10-CM

## 2014-05-01 DIAGNOSIS — R4189 Other symptoms and signs involving cognitive functions and awareness: Secondary | ICD-10-CM

## 2014-05-01 DIAGNOSIS — F482 Pseudobulbar affect: Secondary | ICD-10-CM

## 2014-05-02 LAB — METHYLMALONIC ACID, SERUM: Methylmalonic Acid, Quant: 204 nmol/L (ref 87–318)

## 2014-05-08 ENCOUNTER — Ambulatory Visit (INDEPENDENT_AMBULATORY_CARE_PROVIDER_SITE_OTHER): Payer: Managed Care, Other (non HMO) | Admitting: Neurology

## 2014-05-08 ENCOUNTER — Telehealth: Payer: Self-pay | Admitting: *Deleted

## 2014-05-08 DIAGNOSIS — F482 Pseudobulbar affect: Secondary | ICD-10-CM

## 2014-05-08 DIAGNOSIS — R4182 Altered mental status, unspecified: Secondary | ICD-10-CM

## 2014-05-08 DIAGNOSIS — R4189 Other symptoms and signs involving cognitive functions and awareness: Secondary | ICD-10-CM

## 2014-05-08 NOTE — Telephone Encounter (Signed)
Patient is aware of  normal EEG  she does not wish to do any futher testing at this time

## 2014-05-08 NOTE — Procedures (Signed)
ELECTROENCEPHALOGRAM REPORT  Date of Study: 05/07/2014  Patient's Name: Courtney [name] MRN: [id number] Date of Birth: 11/09/1945  Indication: Altered mental status.  Cognitive impairment.  Medications: Tylenol #3, baclofen, diclofenac, levothyroxine, Janumet, Diovan, Lasix, Ziac, Zocor  Technical Summary: This is a multichannel digital EEG recording, using the international 10-20 placement system.  Spike detection software was employed.  Description: The EEG background is symmetric, with a well-developed posterior dominant rhythm of 9 Hz, which is reactive to eye opening and closing.  Diffuse beta activity is seen, with a bilateral frontal preponderance.  No focal or generalized abnormalities are seen.  No focal or generalized epileptiform discharges are seen.  Stage II sleep is not seen.  Hyperventilation and photic stimulation were performed, and produced no abnormalities.  ECG revealed normal cardiac rate and rhythm.  Impression: This is a normal routine EEG of the awake and drowsy states, with activating procedures.  A normal study does not rule out the possibility of a seizure disorder in this patient.  Houda Brau R. Tomi Likens, DO

## 2014-05-08 NOTE — Telephone Encounter (Signed)
-----   Message from Dudley Major, DO sent at 05/08/2014  1:15 PM EST ----- The EEG is normal.  Since the tests are normal, as discussed, the next step would be neuropsychological testing.   ----- Message -----    From: Dudley Major, DO    Sent: 05/08/2014   1:10 PM      To: Dudley Major, DO

## 2015-02-17 ENCOUNTER — Encounter (INDEPENDENT_AMBULATORY_CARE_PROVIDER_SITE_OTHER): Payer: Self-pay | Admitting: *Deleted

## 2015-03-05 ENCOUNTER — Other Ambulatory Visit (INDEPENDENT_AMBULATORY_CARE_PROVIDER_SITE_OTHER): Payer: Self-pay | Admitting: *Deleted

## 2015-03-05 ENCOUNTER — Encounter (INDEPENDENT_AMBULATORY_CARE_PROVIDER_SITE_OTHER): Payer: Self-pay | Admitting: *Deleted

## 2015-03-05 DIAGNOSIS — Z1211 Encounter for screening for malignant neoplasm of colon: Secondary | ICD-10-CM

## 2015-03-24 ENCOUNTER — Telehealth (INDEPENDENT_AMBULATORY_CARE_PROVIDER_SITE_OTHER): Payer: Self-pay | Admitting: *Deleted

## 2015-03-24 DIAGNOSIS — Z1211 Encounter for screening for malignant neoplasm of colon: Secondary | ICD-10-CM

## 2015-03-24 NOTE — Telephone Encounter (Signed)
Patient needs suprep 

## 2015-03-30 MED ORDER — SUPREP BOWEL PREP KIT 17.5-3.13-1.6 GM/177ML PO SOLN: 1.0000 | Freq: Once | ORAL | Status: DC

## 2015-04-22 ENCOUNTER — Telehealth (INDEPENDENT_AMBULATORY_CARE_PROVIDER_SITE_OTHER): Payer: Self-pay | Admitting: *Deleted

## 2015-04-22 NOTE — Telephone Encounter (Signed)
Referring MD/PCP: koberlein   Procedure: tcs  Reason/Indication:  screening  Has patient had this procedure before?  no  If so, when, by whom and where?    Is there a family history of colon cancer?  no  Who?  What age when diagnosed?    Is patient diabetic?   yes      Does patient have prosthetic heart valve or mechanical valve?  no  Do you have a pacemaker?  no  Has patient ever had endocarditis? no  Has patient had joint replacement within last 12 months?  no  Does patient tend to be constipated or take laxatives? no  Does patient have a history of alcohol/drug use?  no  Is patient on Coumadin, Plavix and/or Aspirin? no  Medications: see epic  Allergies: nkda  Medication Adjustment: hold jardiance day before, decrease lantus to 40 units 2 days before and 30 units day before  Procedure date & time: 05/14/15 at 730

## 2015-04-27 NOTE — Telephone Encounter (Signed)
agree

## 2015-05-14 ENCOUNTER — Encounter (HOSPITAL_COMMUNITY): Payer: Self-pay | Admitting: *Deleted

## 2015-05-14 ENCOUNTER — Encounter (HOSPITAL_COMMUNITY)
Admission: RE | Disposition: A | Discharge status: Home / Self Care | Payer: Self-pay | Source: Ambulatory Visit | Attending: Internal Medicine

## 2015-05-14 ENCOUNTER — Ambulatory Visit (HOSPITAL_COMMUNITY)
Admission: RE | Admit: 2015-05-14 | Discharge: 2015-05-14 | Disposition: A | Discharge status: Home / Self Care | Payer: PPO | Source: Ambulatory Visit | Attending: Internal Medicine | Admitting: Internal Medicine

## 2015-05-14 DIAGNOSIS — I10 Essential (primary) hypertension: Secondary | ICD-10-CM | POA: Diagnosis not present

## 2015-05-14 DIAGNOSIS — Z1211 Encounter for screening for malignant neoplasm of colon: Secondary | ICD-10-CM

## 2015-05-14 DIAGNOSIS — E119 Type 2 diabetes mellitus without complications: Secondary | ICD-10-CM | POA: Insufficient documentation

## 2015-05-14 DIAGNOSIS — Z794 Long term (current) use of insulin: Secondary | ICD-10-CM | POA: Diagnosis not present

## 2015-05-14 DIAGNOSIS — Z79899 Other long term (current) drug therapy: Secondary | ICD-10-CM | POA: Insufficient documentation

## 2015-05-14 DIAGNOSIS — K644 Residual hemorrhoidal skin tags: Secondary | ICD-10-CM | POA: Diagnosis not present

## 2015-05-14 DIAGNOSIS — D120 Benign neoplasm of cecum: Secondary | ICD-10-CM | POA: Diagnosis not present

## 2015-05-14 DIAGNOSIS — D1779 Benign lipomatous neoplasm of other sites: Secondary | ICD-10-CM | POA: Insufficient documentation

## 2015-05-14 DIAGNOSIS — K5521 Angiodysplasia of colon with hemorrhage: Secondary | ICD-10-CM | POA: Diagnosis not present

## 2015-05-14 DIAGNOSIS — D175 Benign lipomatous neoplasm of intra-abdominal organs: Secondary | ICD-10-CM | POA: Diagnosis not present

## 2015-05-14 DIAGNOSIS — K5730 Diverticulosis of large intestine without perforation or abscess without bleeding: Secondary | ICD-10-CM | POA: Insufficient documentation

## 2015-05-14 HISTORY — PX: COLONOSCOPY: SHX5424

## 2015-05-14 LAB — GLUCOSE, CAPILLARY: GLUCOSE-CAPILLARY: 178 mg/dL — AB (ref 65–99)

## 2015-05-14 SURGERY — COLONOSCOPY
Anesthesia: Moderate Sedation

## 2015-05-14 MED ORDER — MEPERIDINE HCL 50 MG/ML IJ SOLN
INTRAMUSCULAR | Status: DC | PRN
  Administered 2015-05-14 (×2): 25 mg via INTRAVENOUS

## 2015-05-14 MED ORDER — MIDAZOLAM HCL 5 MG/5ML IJ SOLN
INTRAMUSCULAR | Status: DC | PRN
  Administered 2015-05-14 (×3): 2 mg via INTRAVENOUS

## 2015-05-14 MED ORDER — SODIUM CHLORIDE 0.9 % IV SOLN
INTRAVENOUS | Status: DC
  Administered 2015-05-14: 1000 mL via INTRAVENOUS

## 2015-05-14 MED ORDER — MEPERIDINE HCL 50 MG/ML IJ SOLN
INTRAMUSCULAR | Status: AC
  Filled 2015-05-14: qty 1

## 2015-05-14 MED ORDER — STERILE WATER FOR IRRIGATION IR SOLN
Status: DC | PRN
  Administered 2015-05-14: 08:00:00

## 2015-05-14 MED ORDER — MIDAZOLAM HCL 5 MG/5ML IJ SOLN
INTRAMUSCULAR | Status: AC
  Filled 2015-05-14: qty 10

## 2015-05-14 NOTE — Op Note (Signed)
COLONOSCOPY PROCEDURE REPORT  PATIENT:  Courtney [name]  MR#:  [id number] Birthdate:  09-25-1945, 69 y.o., female Endoscopist:  Dr. Rogene Houston, MD Referred By:  Dr. Rocky Morel, MD Procedure Date: 05/14/2015  Procedure:   Colonoscopy  Indications:  Average risk screening colonoscopy.  Informed Consent:  The procedure and risks were reviewed with the patient and informed consent was obtained.  Medications:  Demerol 50 mg IV Versed 6 mg IV  Description of procedure:  After a digital rectal exam was performed, that colonoscope was advanced from the anus through the rectum and colon to the area of the cecum, ileocecal valve and appendiceal orifice. The cecum was deeply intubated. These structures were well-seen and photographed for the record. From the level of the cecum and ileocecal valve, the scope was slowly and cautiously withdrawn. The mucosal surfaces were carefully surveyed utilizing scope tip to flexion to facilitate fold flattening as needed. The scope was pulled down into the rectum where a thorough exam including retroflexion was performed.  Findings:   Prep excellent. 12 mm slender worm like polyp at cecum. Polyp was biopsied for histology and rest of the polyp was: Ablated with argon plasma coagulator. Small cecal AV malformation. 15 mm submucosal lipoma at proximal transverse colon. Single small diverticulum at sigmoid colon. Normal rectal mucosa. I'll hemorrhoids below the dentate line.   Therapeutic/Diagnostic Maneuvers Performed:  See above  Complications:  None  EBL: Minimal  Cecal Withdrawal Time:  14 minutes  Impression:  Examination performed to cecum. 12 mm slender wormlike polyp at cecum. This polyp was biopsied for histology and rest of the polyp coagulated with argon plasma coagulator. Small cecal AV malformation. Submucosal lipoma at proximal transverse colon. Single diverticulum at sigmoid colon and small external  hemorrhoids.    Recommendations:  Standard instructions given. No aspirin or NSAIDs for 1 week. I will contact patient with biopsy results and further recommendations.  Markeia Harkless U  05/14/2015 8:15 AM  CC: Dr. Ethlyn Gallery, Kassie Mends, MD & Dr. Rayne Du ref. provider found

## 2015-05-14 NOTE — Discharge Instructions (Addendum)
No aspirin or NSAIDs for 1 week. Can take Tylenol up to 2 g per day as needed. Resume other medications as before. Resume usual diet. No driving for 24 hours. Physician will call with biopsy results.  Colon Polyps Polyps are lumps of extra tissue growing inside the body. Polyps can grow in the large intestine (colon). Most colon polyps are noncancerous (benign). However, some colon polyps can become cancerous over time. Polyps that are larger than a pea may be harmful. To be safe, caregivers remove and test all polyps. CAUSES  Polyps form when mutations in the genes cause your cells to grow and divide even though no more tissue is needed. RISK FACTORS There are a number of risk factors that can increase your chances of getting colon polyps. They include:  Being older than 50 years.  Family history of colon polyps or colon cancer.  Long-term colon diseases, such as colitis or Crohn disease.  Being overweight.  Smoking.  Being inactive.  Drinking too much alcohol. SYMPTOMS  Most small polyps do not cause symptoms. If symptoms are present, they may include:  Blood in the stool. The stool may look dark red or black.  Constipation or diarrhea that lasts longer than 1 week. DIAGNOSIS People often do not know they have polyps until their caregiver finds them during a regular checkup. Your caregiver can use 4 tests to check for polyps:  Digital rectal exam. The caregiver wears gloves and feels inside the rectum. This test would find polyps only in the rectum.  Barium enema. The caregiver puts a liquid called barium into your rectum before taking X-rays of your colon. Barium makes your colon look white. Polyps are dark, so they are easy to see in the X-ray pictures.  Sigmoidoscopy. A thin, flexible tube (sigmoidoscope) is placed into your rectum. The sigmoidoscope has a light and tiny camera in it. The caregiver uses the sigmoidoscope to look at the last third of your  colon.  Colonoscopy. This test is like sigmoidoscopy, but the caregiver looks at the entire colon. This is the most common method for finding and removing polyps. TREATMENT  Any polyps will be removed during a sigmoidoscopy or colonoscopy. The polyps are then tested for cancer. PREVENTION  To help lower your risk of getting more colon polyps:  Eat plenty of fruits and vegetables. Avoid eating fatty foods.  Do not smoke.  Avoid drinking alcohol.  Exercise every day.  Lose weight if recommended by your caregiver.  Eat plenty of calcium and folate. Foods that are rich in calcium include milk, cheese, and broccoli. Foods that are rich in folate include chickpeas, kidney beans, and spinach. HOME CARE INSTRUCTIONS Keep all follow-up appointments as directed by your caregiver. You may need periodic exams to check for polyps. SEEK MEDICAL CARE IF: You notice bleeding during a bowel movement.   This information is not intended to replace advice given to you by your health care provider. Make sure you discuss any questions you have with your health care provider.   Document Released: 02/03/2004 Document Revised: 05/30/2014 Document Reviewed: 07/19/2011 Elsevier Interactive Patient Education 2016 Elsevier Inc. Colonoscopy, Care After Refer to this sheet in the next few weeks. These instructions provide you with information on caring for yourself after your procedure. Your health care provider may also give you more specific instructions. Your treatment has been planned according to current medical practices, but problems sometimes occur. Call your health care provider if you have any problems or questions after your  procedure. WHAT TO EXPECT AFTER THE PROCEDURE  After your procedure, it is typical to have the following:  A small amount of blood in your stool.  Moderate amounts of gas and mild abdominal cramping or bloating. HOME CARE INSTRUCTIONS  Do not drive, operate machinery, or sign  important documents for 24 hours.  You may shower and resume your regular physical activities, but move at a slower pace for the first 24 hours.  Take frequent rest periods for the first 24 hours.  Walk around or put a warm pack on your abdomen to help reduce abdominal cramping and bloating.  Drink enough fluids to keep your urine clear or pale yellow.  You may resume your normal diet as instructed by your health care provider. Avoid heavy or fried foods that are hard to digest.  Avoid drinking alcohol for 24 hours or as instructed by your health care provider.  Only take over-the-counter or prescription medicines as directed by your health care provider.  If a tissue sample (biopsy) was taken during your procedure:  Do not take aspirin or blood thinners for 7 days, or as instructed by your health care provider.  Do not drink alcohol for 7 days, or as instructed by your health care provider.  Eat soft foods for the first 24 hours. SEEK MEDICAL CARE IF: You have persistent spotting of blood in your stool 2-3 days after the procedure. SEEK IMMEDIATE MEDICAL CARE IF:  You have more than a small spotting of blood in your stool.  You pass large blood clots in your stool.  Your abdomen is swollen (distended).  You have nausea or vomiting.  You have a fever.  You have increasing abdominal pain that is not relieved with medicine.   This information is not intended to replace advice given to you by your health care provider. Make sure you discuss any questions you have with your health care provider.   Document Released: 12/22/2003 Document Revised: 02/27/2013 Document Reviewed: 01/14/2013 Elsevier Interactive Patient Education [employer].

## 2015-05-14 NOTE — H&P (Signed)
Courtney [name] is an 69 y.o. female.   Chief Complaint: Patient is here for colonoscopy. [name] is 69 year old Caucasian female who is in for screening colonoscopy. She denies abdominal pain change in bowel habits or rectal bleeding. Last exam was more than 10 years ago. Family history is negative for CRC.  Past Medical History  Diagnosis Date  . Diabetes ([place])   . Hypertension     Past Surgical History  Procedure Laterality Date  . Abdominal hysterectomy    . Oophorectomy      Family History  Problem Relation Age of Onset  . Rheum arthritis Mother   . Heart failure Mother   . Hypertension Mother   . Stroke Father   . Diabetes Brother   . Diabetes Sister   . Hypertension Maternal Grandmother    Social History:  reports that she quit smoking about 10 years ago. Her smoking use included Cigarettes. She has a 5 pack-year smoking history. She has never used smokeless tobacco. She reports that she drinks alcohol. She reports that she does not use illicit drugs.  Allergies: No Known Allergies  Medications Prior to Admission  Medication Sig Dispense Refill  . amLODipine (NORVASC) 10 MG tablet Take 5 mg by mouth daily.     . Aspirin-Salicylamide-Caffeine (BC HEADACHE POWDER PO) Take 2 Packages by mouth daily as needed (headache).    . bisoprolol-hydrochlorothiazide (ZIAC) 2.5-6.25 MG per tablet Take 1 tablet by mouth daily.    . diclofenac (CATAFLAM) 50 MG tablet Take 1 tablet (50 mg total) by mouth 2 (two) times daily. 90 tablet 5  . furosemide (LASIX) 20 MG tablet Take 20 mg by mouth.    . insulin glargine (LANTUS) 100 UNIT/ML injection Inject 60 Units into the skin at bedtime.     Marland Kitchen levothyroxine (SYNTHROID, LEVOTHROID) 125 MCG tablet Take 125 mcg by mouth daily before breakfast.    . naproxen sodium (ANAPROX) 220 MG tablet Take 440 mg by mouth daily as needed (headache).    . simvastatin (ZOCOR) 10 MG tablet Take 10 mg by mouth at bedtime.    . sitaGLIPtin-metformin (JANUMET)  50-1000 MG per tablet Take 1 tablet by mouth 2 (two) times daily with a meal.    . SUPREP BOWEL PREP SOLN Take 1 kit by mouth once. 1 Bottle 0  . valsartan (DIOVAN) 320 MG tablet Take 320 mg by mouth daily.    Marland Kitchen acetaminophen-codeine (TYLENOL #3) 300-30 MG per tablet Take 1 tablet by mouth every 6 (six) hours as needed for pain. 60 tablet 0  . baclofen (LIORESAL) 20 MG tablet Take 20 mg by mouth at bedtime as needed for muscle spasms.    . DiphenhydrAMINE HCl [facility] RELIEF EX) Apply 1 application topically daily as needed (itching).      Results for orders placed or performed during the hospital encounter of 05/14/15 (from the past 48 hour(s))  Glucose, capillary     Status: Abnormal   Collection Time: 05/14/15  7:03 AM  Result Value Ref Range   Glucose-Capillary 178 (H) 65 - 99 mg/dL   No results found.  ROS  Blood pressure 145/74, pulse 74, temperature 98 F (36.7 C), temperature source Oral, resp. rate 19, height _0  (1.549 m), weight 225 lb (102.059 kg), SpO2 100 %. Physical Exam  Constitutional: She appears well-developed and well-nourished.  HENT:  Mouth/Throat: Oropharynx is clear and moist.  Eyes: Conjunctivae are normal.  Neck: No thyromegaly present.  Cardiovascular: Normal rate, regular rhythm and normal  heart sounds.   No murmur heard. Respiratory: Effort normal and breath sounds normal.  GI: Soft. She exhibits no distension and no mass. There is no tenderness.  Musculoskeletal: She exhibits no edema.  Lymphadenopathy:    She has no cervical adenopathy.  Neurological: She is alert.  Skin: Skin is warm and dry.     Assessment/Plan Average risk screening colonoscopy.  Courtney [name] U 05/14/2015, 7:34 AM

## 2015-05-27 ENCOUNTER — Encounter (HOSPITAL_COMMUNITY): Payer: Self-pay | Admitting: Internal Medicine

## 2015-06-05 DIAGNOSIS — I10 Essential (primary) hypertension: Secondary | ICD-10-CM | POA: Diagnosis not present

## 2015-06-05 DIAGNOSIS — D649 Anemia, unspecified: Secondary | ICD-10-CM | POA: Diagnosis not present

## 2015-06-05 DIAGNOSIS — E039 Hypothyroidism, unspecified: Secondary | ICD-10-CM | POA: Diagnosis not present

## 2015-06-05 DIAGNOSIS — M2550 Pain in unspecified joint: Secondary | ICD-10-CM | POA: Diagnosis not present

## 2015-06-05 DIAGNOSIS — Z1389 Encounter for screening for other disorder: Secondary | ICD-10-CM | POA: Diagnosis not present

## 2015-06-05 DIAGNOSIS — Z6841 Body Mass Index (BMI) 40.0 and over, adult: Secondary | ICD-10-CM | POA: Diagnosis not present

## 2015-06-05 DIAGNOSIS — M2540 Effusion, unspecified joint: Secondary | ICD-10-CM | POA: Diagnosis not present

## 2015-06-05 DIAGNOSIS — E1129 Type 2 diabetes mellitus with other diabetic kidney complication: Secondary | ICD-10-CM | POA: Diagnosis not present

## 2015-06-09 DIAGNOSIS — Z1389 Encounter for screening for other disorder: Secondary | ICD-10-CM | POA: Diagnosis not present

## 2015-06-09 DIAGNOSIS — E1129 Type 2 diabetes mellitus with other diabetic kidney complication: Secondary | ICD-10-CM | POA: Diagnosis not present

## 2015-06-09 DIAGNOSIS — E039 Hypothyroidism, unspecified: Secondary | ICD-10-CM | POA: Diagnosis not present

## 2015-06-09 DIAGNOSIS — Z6841 Body Mass Index (BMI) 40.0 and over, adult: Secondary | ICD-10-CM | POA: Diagnosis not present

## 2015-06-09 DIAGNOSIS — F329 Major depressive disorder, single episode, unspecified: Secondary | ICD-10-CM | POA: Diagnosis not present

## 2015-09-22 ENCOUNTER — Other Ambulatory Visit: Payer: Self-pay

## 2015-09-22 DIAGNOSIS — Z1389 Encounter for screening for other disorder: Secondary | ICD-10-CM | POA: Diagnosis not present

## 2015-09-22 DIAGNOSIS — I10 Essential (primary) hypertension: Secondary | ICD-10-CM | POA: Diagnosis not present

## 2015-09-22 DIAGNOSIS — Z1231 Encounter for screening mammogram for malignant neoplasm of breast: Secondary | ICD-10-CM

## 2015-09-22 DIAGNOSIS — E782 Mixed hyperlipidemia: Secondary | ICD-10-CM | POA: Diagnosis not present

## 2015-09-22 DIAGNOSIS — E1165 Type 2 diabetes mellitus with hyperglycemia: Secondary | ICD-10-CM | POA: Diagnosis not present

## 2015-09-22 DIAGNOSIS — Z23 Encounter for immunization: Secondary | ICD-10-CM | POA: Diagnosis not present

## 2015-09-22 DIAGNOSIS — Z6841 Body Mass Index (BMI) 40.0 and over, adult: Secondary | ICD-10-CM | POA: Diagnosis not present

## 2015-10-21 ENCOUNTER — Other Ambulatory Visit (HOSPITAL_COMMUNITY): Payer: Self-pay | Admitting: Physician Assistant

## 2015-10-21 ENCOUNTER — Ambulatory Visit (HOSPITAL_COMMUNITY)
Admission: RE | Admit: 2015-10-21 | Discharge: 2015-10-21 | Disposition: A | Discharge status: Home / Self Care | Payer: PPO | Source: Ambulatory Visit | Attending: Physician Assistant | Admitting: Physician Assistant

## 2015-10-21 ENCOUNTER — Ambulatory Visit (HOSPITAL_COMMUNITY): Payer: PPO

## 2015-10-21 DIAGNOSIS — Z1231 Encounter for screening mammogram for malignant neoplasm of breast: Secondary | ICD-10-CM

## 2015-10-23 DIAGNOSIS — M25561 Pain in right knee: Secondary | ICD-10-CM | POA: Diagnosis not present

## 2015-10-23 DIAGNOSIS — M25562 Pain in left knee: Secondary | ICD-10-CM | POA: Diagnosis not present

## 2016-07-13 DIAGNOSIS — M1991 Primary osteoarthritis, unspecified site: Secondary | ICD-10-CM | POA: Diagnosis not present

## 2016-07-13 DIAGNOSIS — Z1389 Encounter for screening for other disorder: Secondary | ICD-10-CM | POA: Diagnosis not present

## 2016-07-13 DIAGNOSIS — Z6841 Body Mass Index (BMI) 40.0 and over, adult: Secondary | ICD-10-CM | POA: Diagnosis not present

## 2016-07-13 DIAGNOSIS — E782 Mixed hyperlipidemia: Secondary | ICD-10-CM | POA: Diagnosis not present

## 2016-07-13 DIAGNOSIS — Z23 Encounter for immunization: Secondary | ICD-10-CM | POA: Diagnosis not present

## 2016-07-13 DIAGNOSIS — E063 Autoimmune thyroiditis: Secondary | ICD-10-CM | POA: Diagnosis not present

## 2016-07-13 DIAGNOSIS — I10 Essential (primary) hypertension: Secondary | ICD-10-CM | POA: Diagnosis not present

## 2016-07-13 DIAGNOSIS — E1165 Type 2 diabetes mellitus with hyperglycemia: Secondary | ICD-10-CM | POA: Diagnosis not present

## 2016-09-01 DIAGNOSIS — M25561 Pain in right knee: Secondary | ICD-10-CM | POA: Diagnosis not present

## 2016-09-01 DIAGNOSIS — M170 Bilateral primary osteoarthritis of knee: Secondary | ICD-10-CM | POA: Diagnosis not present

## 2016-09-01 DIAGNOSIS — M25462 Effusion, left knee: Secondary | ICD-10-CM | POA: Diagnosis not present

## 2016-09-01 DIAGNOSIS — M25562 Pain in left knee: Secondary | ICD-10-CM | POA: Diagnosis not present

## 2016-09-01 DIAGNOSIS — M1712 Unilateral primary osteoarthritis, left knee: Secondary | ICD-10-CM | POA: Diagnosis not present

## 2016-09-08 DIAGNOSIS — M25562 Pain in left knee: Secondary | ICD-10-CM | POA: Diagnosis not present

## 2016-09-08 DIAGNOSIS — M1712 Unilateral primary osteoarthritis, left knee: Secondary | ICD-10-CM | POA: Diagnosis not present

## 2016-09-14 DIAGNOSIS — M25562 Pain in left knee: Secondary | ICD-10-CM | POA: Diagnosis not present

## 2016-09-14 DIAGNOSIS — M1712 Unilateral primary osteoarthritis, left knee: Secondary | ICD-10-CM | POA: Diagnosis not present

## 2016-09-21 DIAGNOSIS — M25562 Pain in left knee: Secondary | ICD-10-CM | POA: Diagnosis not present

## 2016-09-21 DIAGNOSIS — M1712 Unilateral primary osteoarthritis, left knee: Secondary | ICD-10-CM | POA: Diagnosis not present

## 2016-09-22 DIAGNOSIS — Z0001 Encounter for general adult medical examination with abnormal findings: Secondary | ICD-10-CM | POA: Diagnosis not present

## 2016-09-22 DIAGNOSIS — F330 Major depressive disorder, recurrent, mild: Secondary | ICD-10-CM | POA: Diagnosis not present

## 2016-09-22 DIAGNOSIS — Z1389 Encounter for screening for other disorder: Secondary | ICD-10-CM | POA: Diagnosis not present

## 2016-09-22 DIAGNOSIS — E1129 Type 2 diabetes mellitus with other diabetic kidney complication: Secondary | ICD-10-CM | POA: Diagnosis not present

## 2016-09-22 DIAGNOSIS — I10 Essential (primary) hypertension: Secondary | ICD-10-CM | POA: Diagnosis not present

## 2016-09-22 DIAGNOSIS — N183 Chronic kidney disease, stage 3 (moderate): Secondary | ICD-10-CM | POA: Diagnosis not present

## 2016-09-22 DIAGNOSIS — E782 Mixed hyperlipidemia: Secondary | ICD-10-CM | POA: Diagnosis not present

## 2016-09-22 DIAGNOSIS — Z6841 Body Mass Index (BMI) 40.0 and over, adult: Secondary | ICD-10-CM | POA: Diagnosis not present

## 2016-10-04 DIAGNOSIS — M25562 Pain in left knee: Secondary | ICD-10-CM | POA: Diagnosis not present

## 2016-10-04 DIAGNOSIS — M1712 Unilateral primary osteoarthritis, left knee: Secondary | ICD-10-CM | POA: Diagnosis not present

## 2016-10-12 DIAGNOSIS — E11319 Type 2 diabetes mellitus with unspecified diabetic retinopathy without macular edema: Secondary | ICD-10-CM | POA: Diagnosis not present

## 2016-11-29 ENCOUNTER — Other Ambulatory Visit (HOSPITAL_COMMUNITY): Payer: Self-pay | Admitting: Family Medicine

## 2016-11-29 DIAGNOSIS — E2839 Other primary ovarian failure: Secondary | ICD-10-CM

## 2016-12-14 ENCOUNTER — Ambulatory Visit (HOSPITAL_COMMUNITY)
Admission: RE | Admit: 2016-12-14 | Discharge: 2016-12-14 | Disposition: A | Discharge status: Home / Self Care | Payer: PPO | Source: Ambulatory Visit | Attending: Family Medicine | Admitting: Family Medicine

## 2016-12-14 DIAGNOSIS — Z13820 Encounter for screening for osteoporosis: Secondary | ICD-10-CM | POA: Diagnosis not present

## 2016-12-14 DIAGNOSIS — E2839 Other primary ovarian failure: Secondary | ICD-10-CM | POA: Insufficient documentation

## 2016-12-14 DIAGNOSIS — Z780 Asymptomatic menopausal state: Secondary | ICD-10-CM | POA: Diagnosis not present

## 2017-01-19 DIAGNOSIS — E782 Mixed hyperlipidemia: Secondary | ICD-10-CM | POA: Diagnosis not present

## 2017-01-19 DIAGNOSIS — F0390 Unspecified dementia without behavioral disturbance: Secondary | ICD-10-CM | POA: Diagnosis not present

## 2017-01-19 DIAGNOSIS — Z6839 Body mass index (BMI) 39.0-39.9, adult: Secondary | ICD-10-CM | POA: Diagnosis not present

## 2017-01-19 DIAGNOSIS — I10 Essential (primary) hypertension: Secondary | ICD-10-CM | POA: Diagnosis not present

## 2017-01-19 DIAGNOSIS — Z1389 Encounter for screening for other disorder: Secondary | ICD-10-CM | POA: Diagnosis not present

## 2017-01-19 DIAGNOSIS — E1165 Type 2 diabetes mellitus with hyperglycemia: Secondary | ICD-10-CM | POA: Diagnosis not present

## 2017-04-28 ENCOUNTER — Ambulatory Visit (INDEPENDENT_AMBULATORY_CARE_PROVIDER_SITE_OTHER): Payer: PPO | Admitting: Neurology

## 2017-04-28 ENCOUNTER — Encounter: Payer: Self-pay | Admitting: Neurology

## 2017-04-28 VITALS — BP 130/86 | HR 87 | Wt 225.0 lb

## 2017-04-28 DIAGNOSIS — G309 Alzheimer's disease, unspecified: Secondary | ICD-10-CM

## 2017-04-28 DIAGNOSIS — F329 Major depressive disorder, single episode, unspecified: Secondary | ICD-10-CM

## 2017-04-28 DIAGNOSIS — F0280 Dementia in other diseases classified elsewhere without behavioral disturbance: Secondary | ICD-10-CM

## 2017-04-28 DIAGNOSIS — F32A Depression, unspecified: Secondary | ICD-10-CM

## 2017-04-28 DIAGNOSIS — M170 Bilateral primary osteoarthritis of knee: Secondary | ICD-10-CM | POA: Diagnosis not present

## 2017-04-28 NOTE — Patient Instructions (Signed)
1.  Continue Aricept (donepezil) 10mg  at bedtime. 2.  Continue sertraline. 3.  We will refer you to [facility] for counseling 4.  We will also schedule you for neurocognitive testing 5.  Please review information provided on Alzheimer's disease 6.  Follow up in 6 months.

## 2017-04-28 NOTE — Progress Notes (Signed)
NEUROLOGY FOLLOW UP OFFICE NOTE  ULIANA BRINKER [id number]  HISTORY OF PRESENT ILLNESS: Sarinity Dicicco is a 71 year old right-handed woman with hypothyroidism, diabetes and hypertension returns today for worsening memory.  She is accompanied by her daughter who supplements history.  UPDATE: I saw Ms. Juliana in December 2015 for altered mental status and cognitive impairment.  MoCA was 17/30.  B12 from 04/29/14 was 334 with a methylmalonic acid level of 204.  MRI of brain without contrast was personally reviewed and demonstrated chronic small vessel ischemic changes.  EEG from 05/08/14 was normal.  Neuropsychological testing was recommended but she declined.  She has had continued decline in memory.  She frequently repeats herself.  Her father-in-law had passed away in 2022/10/26 and when there was a recent memorial, she asked if it was open casket.  Her husband has to manage her medications.  She has not driven for several years.  Her depression has gotten worse.  Her son committed suicide in Oct 26, 2015.  She frequently fidgets.  She does not enjoy going out or getting dress.  She is taking donepezil 10mg  at bedtime.   HISTORY: She has had a change in behavior in 2015.  She started feeling cold all the time.  She once ran out of Synthroid and didn't have it refilled.  She was found to have a very elevated TSH and her Synthroid was restarted with dose adjustment.  She also easily will start crying at any little thing.  She doesn't feel profoundly sad when it occurs.  She can't help it from happening.  Her husband was undergoing daily radiation treatments for prostate cancer.  One day, when he came home from a routine treatment, she asked him where he was.  However, she maintains that she asked him only because it seemed like he got home later from the treatment than usual.  She does not have trouble recognizing faces but she sometimes has trouble recalling names.  She does not have hallucinations or delusions.   She is able to perform all of her ADLs.  She does report some depression, related to her son who is an alcoholic, but this is a longstanding problem.  She hasn't driven for several years due to anxiety.  Her granddaughter is irritated with her because she is unwilling to drive and pick up the great-granddaughter from daycare due to her anxiety.  She presented to the ED at [facility] on 04/24/14 for bizarre behavior.  She woke up that morning, later than usual.  She and her husband sleep in different bedrooms.  She went to her husband's bathroom to use the toilet, which was unusual.  The night before, they but up the Christmas tree and left the paper which wrapped the ornaments on the floor.  That morning, she asked him where the paper came from.  After she went back to her room, her husband later found her on the toilet but noticed she had urinated on the bedroom floor.  When he asked her what was wrong, she looked up at him confused and didn't know what he was taking about.   She does recall the events of this morning, but it is a little fuzzy.  She did not bite her tongue and there is no indication that she experienced convulsions.  Labs, including CBC, CMP, UA, troponins, and ammonia were unremarkable.  TSH was 6.240.  CT of the head was normal.     She has no history of seizure or  head injury.  She has no known family history of dementia.    PAST MEDICAL HISTORY: Past Medical History:  Diagnosis Date  . Diabetes ([place])   . Hypertension     MEDICATIONS: Current Outpatient Medications on File Prior to Visit  Medication Sig Dispense Refill  . cyanocobalamin 1000 MCG tablet Take 1,000 mcg by mouth daily.    . Omega 3 1000 MG CAPS Take 1 capsule by mouth daily.    Marland Kitchen acetaminophen-codeine (TYLENOL #3) 300-30 MG per tablet Take 1 tablet by mouth every 6 (six) hours as needed for pain. (Patient not taking: Reported on 04/28/2017) 60 tablet 0  . amLODipine (NORVASC) 10 MG tablet Take 5 mg by mouth daily.      . baclofen (LIORESAL) 20 MG tablet Take 20 mg by mouth at bedtime as needed for muscle spasms.    . DiphenhydrAMINE HCl [facility] RELIEF EX) Apply 1 application topically daily as needed (itching).    Marland Kitchen donepezil (ARICEPT) 10 MG tablet Take 10 mg by mouth daily.    . furosemide (LASIX) 20 MG tablet Take 20 mg by mouth.    . levothyroxine (SYNTHROID, LEVOTHROID) 125 MCG tablet Take 150 mcg by mouth daily before breakfast.     . sertraline (ZOLOFT) 100 MG tablet Take 100 mg by mouth daily.    . simvastatin (ZOCOR) 10 MG tablet Take 10 mg by mouth at bedtime.    Willeen Niece 100-33 UNT-MCG/ML SOPN Inject 50 Units into the skin as directed.    . valsartan (DIOVAN) 320 MG tablet Take 320 mg by mouth daily.     No current facility-administered medications on file prior to visit.     ALLERGIES: No Known Allergies  FAMILY HISTORY: Family History  Problem Relation Age of Onset  . Rheum arthritis Mother   . Heart failure Mother   . Hypertension Mother   . Stroke Father   . Diabetes Brother   . Diabetes Sister   . Hypertension Maternal Grandmother     SOCIAL HISTORY: Social History   Socioeconomic History  . Marital status: Married    Spouse name: Not on file  . Number of children: Not on file  . Years of education: Not on file  . Highest education level: Not on file  Social Needs  . Financial resource strain: Not on file  . Food insecurity - worry: Not on file  . Food insecurity - inability: Not on file  . Transportation needs - medical: Not on file  . Transportation needs - non-medical: Not on file  Occupational History  . Not on file  Tobacco Use  . Smoking status: Former Smoker    Packs/day: 0.50    Years: 10.00    Pack years: 5.00    Types: Cigarettes    Last attempt to quit: 05/22/2004    Years since quitting: 12.9  . Smokeless tobacco: Never Used  Substance and Sexual Activity  . Alcohol use: Yes    Alcohol/week: 0.0 oz    Comment: social  . Drug use: No  . Sexual  activity: No  Other Topics Concern  . Not on file  Social History Narrative  . Not on file    REVIEW OF SYSTEMS: Constitutional: No fevers, chills, or sweats, no generalized fatigue, change in appetite Eyes: No visual changes, double vision, eye pain Ear, nose and throat: No hearing loss, ear pain, nasal congestion, sore throat Cardiovascular: No chest pain, palpitations Respiratory:  No shortness of breath at rest or with  exertion, wheezes GastrointestinaI: No nausea, vomiting, diarrhea, abdominal pain, fecal incontinence Genitourinary:  No dysuria, urinary retention or frequency Musculoskeletal:  No neck pain, back pain Integumentary: No rash, pruritus, skin lesions Neurological: as above Psychiatric: depression, anxiety Endocrine: No palpitations, fatigue, diaphoresis, mood swings, change in appetite, change in weight, increased thirst Hematologic/Lymphatic:  No purpura, petechiae. Allergic/Immunologic: no itchy/runny eyes, nasal congestion, recent allergic reactions, rashes  PHYSICAL EXAM: Vitals:   04/28/17 0928  BP: 130/86  Pulse: 87  SpO2: 96%   General: No acute distress.  Patient appears well-groomed.   Head:  Normocephalic/atraumatic Eyes:  Fundi examined but not visualized Neck: supple, no paraspinal tenderness, full range of motion Heart:  Regular rate and rhythm Lungs:  Clear to auscultation bilaterally Back: No paraspinal tenderness Neurological Exam: alert and oriented to person and place but not time. Attention span and concentration impaired, recent memory poor, remote memory intact, fund of knowledge intact.  Speech fluent and not dysarthric, language intact.   MMSE - Mini Mental State Exam 04/28/2017  Orientation to time 0  Orientation to Place 3  Registration 3  Attention/ Calculation 0  Recall 1  Language- name 2 objects 2  Language- repeat 1  Language- follow 3 step command 3  Language- read & follow direction 1  Write a sentence 1  Copy design 1    Total score 16  CN II-XII intact. Bulk and tone normal, muscle strength 5/5 throughout.  Sensation to light touch  intact.  Deep tendon reflexes 2+ throughout.  Finger to nose testing intact.  Gait normal, Romberg negative.  IMPRESSION: Alzheimer's disease Depression  PLAN: 1.  Continue Aricept (donepezil) 10mg  at bedtime. 2.  Continue sertraline. 3.  We will refer to [facility] for counseling 4.  We will also schedule for neurocognitive testing 5.  Provided information provided on Alzheimer's disease 6.  Follow up in 6 months.  28 minutes spent face to face with patient, over 50% spent discussing diagnosis and management.  Metta Clines, DO  CC:  Collene Mares, PA-C

## 2017-06-07 ENCOUNTER — Telehealth: Payer: Self-pay | Admitting: Neurology

## 2017-06-07 NOTE — Telephone Encounter (Signed)
LM for [name] to rtrn my call

## 2017-06-07 NOTE — Telephone Encounter (Signed)
Patient's daughter [name] called regarding the referral to [facility] where Dr. Tomi Likens had recommended she go. She took her there today and they told her that she needed a neuropsychologist. She is scheduled to see Dr. Si Raider in April. She also took her to [place] and they were full. She then went to [place] to [facility] and spoke with a Counselor at that office. They mentioned that her depression meds may need to be increased. They also said that to treat the depression will not help her memory. Her daughter is very confused as to what to do. Please Call. Thanks

## 2017-06-09 NOTE — Telephone Encounter (Signed)
Called and LM for [name] to rtrn my call

## 2017-06-14 NOTE — Telephone Encounter (Signed)
[name] rtrnd my call. Basically, no one wants to start her in counseling, it is felt her dementia will prevent her remembering from session to session. Pt has become increasingly aggitated and angry, her husband is overwhelmed and is considering leaving the situation due to Pt being so mean and nasty to him and the family. [name] wants to know if there is any additional medicines they can try until she has appt w/Dr Si Raider.

## 2017-06-15 NOTE — Telephone Encounter (Signed)
In addition to donepezil, we can start memantine: 5mg  at bedtime for 1 week, then 5mg  twice daily for 1 week, then 10mg  twice daily.  It may help slow down progression of memory loss but also may treat behavioral changes  She is already on sertraline for depression.  I recommend  that she see a psychiatrist who is specialized to prescribe the most appropriate medication to treat her depression and behavioral changes  If her  husband is feeling overwhelmed, I think they need to look into getting hiring a caregiver who is experienced in working with people who have Alzheimer's.  We provided information that should include resources that they may contact

## 2017-06-15 NOTE — Telephone Encounter (Signed)
Called and spoke with [name], advsd her of addition of memanatine and how to titrate. Reminded her of the information booklet we provided them concerning Alzheimers, she will look into possibly getting someone to come into the home to help. Called [facility], spoke with [employer]. He suggested 10mg  tabs and take 1/2 until titrate's to the 10 mg BID, gave gave verbal with 3 refills.

## 2017-07-14 DIAGNOSIS — I10 Essential (primary) hypertension: Secondary | ICD-10-CM | POA: Diagnosis not present

## 2017-07-14 DIAGNOSIS — F329 Major depressive disorder, single episode, unspecified: Secondary | ICD-10-CM | POA: Diagnosis not present

## 2017-07-14 DIAGNOSIS — N183 Chronic kidney disease, stage 3 (moderate): Secondary | ICD-10-CM | POA: Diagnosis not present

## 2017-07-14 DIAGNOSIS — Z6839 Body mass index (BMI) 39.0-39.9, adult: Secondary | ICD-10-CM | POA: Diagnosis not present

## 2017-07-14 DIAGNOSIS — E039 Hypothyroidism, unspecified: Secondary | ICD-10-CM | POA: Diagnosis not present

## 2017-07-14 DIAGNOSIS — E782 Mixed hyperlipidemia: Secondary | ICD-10-CM | POA: Diagnosis not present

## 2017-07-14 DIAGNOSIS — E1129 Type 2 diabetes mellitus with other diabetic kidney complication: Secondary | ICD-10-CM | POA: Diagnosis not present

## 2017-07-14 DIAGNOSIS — D485 Neoplasm of uncertain behavior of skin: Secondary | ICD-10-CM | POA: Diagnosis not present

## 2017-07-21 DIAGNOSIS — N183 Chronic kidney disease, stage 3 (moderate): Secondary | ICD-10-CM | POA: Diagnosis not present

## 2017-07-21 DIAGNOSIS — E782 Mixed hyperlipidemia: Secondary | ICD-10-CM | POA: Diagnosis not present

## 2017-07-21 DIAGNOSIS — Z6839 Body mass index (BMI) 39.0-39.9, adult: Secondary | ICD-10-CM | POA: Diagnosis not present

## 2017-07-21 DIAGNOSIS — E063 Autoimmune thyroiditis: Secondary | ICD-10-CM | POA: Diagnosis not present

## 2017-07-21 DIAGNOSIS — E1129 Type 2 diabetes mellitus with other diabetic kidney complication: Secondary | ICD-10-CM | POA: Diagnosis not present

## 2017-07-21 DIAGNOSIS — I10 Essential (primary) hypertension: Secondary | ICD-10-CM | POA: Diagnosis not present

## 2017-07-21 DIAGNOSIS — F329 Major depressive disorder, single episode, unspecified: Secondary | ICD-10-CM | POA: Diagnosis not present

## 2017-07-21 DIAGNOSIS — R269 Unspecified abnormalities of gait and mobility: Secondary | ICD-10-CM | POA: Diagnosis not present

## 2017-07-31 DIAGNOSIS — E1129 Type 2 diabetes mellitus with other diabetic kidney complication: Secondary | ICD-10-CM | POA: Diagnosis not present

## 2017-08-11 DIAGNOSIS — E1129 Type 2 diabetes mellitus with other diabetic kidney complication: Secondary | ICD-10-CM | POA: Diagnosis not present

## 2017-08-17 DIAGNOSIS — E1129 Type 2 diabetes mellitus with other diabetic kidney complication: Secondary | ICD-10-CM | POA: Diagnosis not present

## 2017-08-21 DIAGNOSIS — R600 Localized edema: Secondary | ICD-10-CM | POA: Diagnosis not present

## 2017-08-21 DIAGNOSIS — R05 Cough: Secondary | ICD-10-CM | POA: Diagnosis not present

## 2017-08-21 DIAGNOSIS — Z6841 Body Mass Index (BMI) 40.0 and over, adult: Secondary | ICD-10-CM | POA: Diagnosis not present

## 2017-08-21 DIAGNOSIS — R0602 Shortness of breath: Secondary | ICD-10-CM | POA: Diagnosis not present

## 2017-08-21 DIAGNOSIS — D649 Anemia, unspecified: Secondary | ICD-10-CM | POA: Diagnosis not present

## 2017-08-23 ENCOUNTER — Other Ambulatory Visit (HOSPITAL_COMMUNITY): Payer: Self-pay | Admitting: Physician Assistant

## 2017-08-23 ENCOUNTER — Ambulatory Visit (HOSPITAL_COMMUNITY)
Admission: RE | Admit: 2017-08-23 | Discharge: 2017-08-23 | Disposition: A | Discharge status: Home / Self Care | Payer: PPO | Source: Ambulatory Visit | Attending: Physician Assistant | Admitting: Physician Assistant

## 2017-08-23 DIAGNOSIS — J479 Bronchiectasis, uncomplicated: Secondary | ICD-10-CM | POA: Diagnosis not present

## 2017-08-23 DIAGNOSIS — R05 Cough: Secondary | ICD-10-CM | POA: Diagnosis not present

## 2017-08-23 DIAGNOSIS — R059 Cough, unspecified: Secondary | ICD-10-CM

## 2017-08-23 DIAGNOSIS — R0602 Shortness of breath: Secondary | ICD-10-CM | POA: Diagnosis not present

## 2017-08-23 DIAGNOSIS — R918 Other nonspecific abnormal finding of lung field: Secondary | ICD-10-CM | POA: Insufficient documentation

## 2017-08-23 DIAGNOSIS — R9389 Abnormal findings on diagnostic imaging of other specified body structures: Secondary | ICD-10-CM

## 2017-08-24 ENCOUNTER — Ambulatory Visit (HOSPITAL_COMMUNITY): Payer: PPO

## 2017-08-24 ENCOUNTER — Ambulatory Visit: Payer: PPO | Admitting: Psychology

## 2017-08-24 ENCOUNTER — Encounter: Payer: Self-pay | Admitting: Psychology

## 2017-08-24 DIAGNOSIS — F0391 Unspecified dementia with behavioral disturbance: Secondary | ICD-10-CM

## 2017-08-24 NOTE — Progress Notes (Addendum)
   Neuropsychology Note  Courtney [name] completed 60 minutes of neuropsychological testing with technician, Milana Kidney, BS, under the supervision of Dr. Macarthur Critchley, Licensed Psychologist. The patient did not appear overtly distressed by the testing session, per behavioral observation or via self-report to the technician. Rest breaks were offered.   Clinical Decision Making: In considering the patient's current level of functioning, level of presumed impairment, nature of symptoms, emotional and behavioral responses during the interview, level of literacy, and observed level of motivation/effort, a battery of tests was selected and communicated to the psychometrician.  Communication between the psychologist and technician was ongoing throughout the testing session and changes were made as deemed necessary based on patient performance on testing, technician observations and additional pertinent factors such as those listed above.  Courtney [name] will return within approximately 2 weeks for an interactive feedback session with Dr. Si Raider at which time her test performances, clinical impressions and treatment recommendations will be reviewed in detail. The patient understands she can contact our office should she require our assistance before this time.  15 minutes spent performing neuropsychological evaluation services/clinical decision making (psychologist). [CPT 32761] 60 minutes spent face-to-face with patient administering standardized tests, 30 minutes spent scoring (technician). [CPT [id number], 47092]  Full report to follow.

## 2017-08-24 NOTE — Progress Notes (Signed)
NEUROBEHAVIORAL STATUS EXAM   Name: Courtney [name] Date of Birth: May 13, 1946 Date of Interview: 08/24/2017  Reason for Referral:  Courtney [name] is a 72 y.o. female who is referred for neuropsychological evaluation by Dr. Metta Clines of [facility] due to concerns about Alzheimer's dementia. This patient is accompanied in the office by her daughter, Courtney [name], who supplements the history.  History of Presenting Problem:  [name] was seen for initial neurologic consultation by Dr. Tomi Likens in December 2015 after she had an episode of behavioral change in which she woke up in the middle of the night, took off all her clothes and urinated on the bedroom floor. She was taken to the ED. Labs including UA were essentially normal, and head CT was normal. When she saw Dr. Tomi Likens on 04/29/2014, MoCA was 17/30. Vitamin B12 was 334. Brain MRI was performed on 05/01/2014 and reportedly showed no acute intracranial abnormality, with slightly advanced and scattered subcortical T2 hyperintensities bilaterally most likely representing chronic microvascular ischemia. Routine EEG on 05/08/2014 was normal. She declined any further testing including neurocognitive evaluation. She returned to our clinic in December 2018 to see Dr. Tomi Likens. Family reported worsening memory. MoCA was 16/30. She was also experiencing depression related to her son's suicide in 2017. She was already on sertraline, memantine and donepezil. She was referred for counseling but therapist did not feel she could benefit from sessions due to significant memory impairment.   At today's appointment (08/24/2017) the patient's daughter relates a history of gradual memory/cognitive decline which seemed exacerbated by her son's suicide in May 2017. Per her daughter, the patient "seems stuck in that time" and "it's almost like she stopped that day, too". At the present time, the patient is demonstrating significant memory impairment for recent  conversations/events and disorientation to time and place. She is not able to manage any instrumental ADLs. She always did only very little driving locally but she stopped driving altogether several years ago. Her husband "has always done everything for her" per her daughter so it is difficult to tell how much is a decline, but the patient does not manage her medications, appointments, finances or housekeeping. Her husband has always done the grocery shopping as well. The patient's self hygiene has declined. She used to take 1-2 baths a day and now her family has to remind her to bathe and sometimes almost force her. She stays in her housecoat. She doesn't do any cooking. She is much more agitated and this has been a major issue lately. She gets very irritated and belligerent when she thinks something happened but it really didn't. For example, she has it in her mind that her husband is still working, so when he gets home she will ask him where he was, and she won't believe him that he wasn't working. She will get very mad at him and cuss at him if she doesn't believe him. She also is more distrustful of her stepchildren and seems to think they are conspiring with her husband to take her money. She did allow her daughter to be financial POA and they recently got this paperwork completed. She is "obsessed" with making sure she has her money, and she keeps several hundred dollars in her purse.   She is also demonstrating reduced attention. She will be watching television or be in a group conversation and then just look away and appear to "zone out" "like she is somewhere else". She is sleeping much more, and will sleep  in until 11 am or noon. Her husband has to get her out of bed. She also dozes off during the day. She is not having any incontinence on a regular basis. She has not had any hallucinations.   She was wheezing and having some difficulty breathing recently. They thought she may have a cold but it  wasn't getting better. They saw her doctor this week and she was told she has congestive heart failure. She was given fluid pills and has already lost 11 lbs of fluid this week. She had an ultrasound of the heart and was sent for a CT of the heart which will take place tomorrow (08/25/2017).   She has some difficulty walking due to knee problems and being so swollen with fluid retention. She has not had any falls.   There is no known family history of dementia.   She never had any mental health treatment until after her son committed suicide. She was put on sertraline and memantine at that time, per her daughter. Now she will cry if she thinks of or talks about her son. Upon direct questioning, the patient denies any significant or persistent sadness, anxiety or stress. She also denies having any concerns about her memory or cognitive functioning. She feels any forgetfulness she is experiencing is probably due to aging.   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 She has been with her current husband for about 35 years. She had two children from previous relationship/marriage; her daughter Courtney [name] lives a few miles from her and is very supportive as are [name]'s two daughters. The patient's son had a history of addiction and mental illness. He committed suicide in May 2017. Alcohol: No alcohol use. Tobacco: Former smoker, quit 30 years ago. SA: Denied.    Medical History: Past Medical History:  Diagnosis Date  . Diabetes ([place])   . Hypertension   Congestive heart failure Dementia   Current Medications:  Outpatient Encounter Medications as of 08/24/2017  Medication Sig  . acetaminophen-codeine (TYLENOL #3) 300-30 MG per tablet Take 1 tablet by mouth every 6 (six) hours as needed for pain.  (Patient not taking: Reported on 04/28/2017)  . amLODipine (NORVASC) 10 MG tablet Take 5 mg by mouth daily.   . baclofen (LIORESAL) 20 MG tablet Take 20 mg by mouth at bedtime as needed for muscle spasms.  . cyanocobalamin 1000 MCG tablet Take 1,000 mcg by mouth daily.  . DiphenhydrAMINE HCl [facility] RELIEF EX) Apply 1 application topically daily as needed (itching).  Marland Kitchen donepezil (ARICEPT) 10 MG tablet Take 10 mg by mouth daily.  . furosemide (LASIX) 20 MG tablet Take 20 mg by mouth.  . levothyroxine (SYNTHROID, LEVOTHROID) 125 MCG tablet Take 150 mcg by mouth daily before breakfast.   . Omega 3 1000 MG CAPS Take 1 capsule by mouth daily.  . sertraline (ZOLOFT) 100 MG tablet Take 100 mg by mouth daily.  . simvastatin (ZOCOR) 10 MG tablet Take 10 mg by mouth at bedtime.  Willeen Niece 100-33 UNT-MCG/ML SOPN Inject 50 Units into the skin as directed.  . valsartan (DIOVAN) 320 MG tablet Take 320 mg by mouth daily.   No facility-administered encounter medications on file as of 08/24/2017.      Behavioral Observations:   Appearance: Neatly, casually and appropriately dressed and groomed Gait:  Ambulated independently, no gross abnormalities observed Speech: Fluent; normal rate, rhythm and volume. She does repeat herself (told me on at least 4 different occasions that the woman with her is her daughter). Thought process: Appears linear Affect: Full, euthymic Interpersonal: Pleasant, appropriate   60 minutes spent face-to-face with patient completing neurobehavioral status exam. 35 minutes spent integrating medical records/clinical data and completing this report. CPT codes: [id number] unit; [id number] unit.   TESTING: There is medical necessity to proceed with neuropsychological assessment as the results will be used to aid in differential diagnosis and clinical decision-making and to inform specific treatment recommendations. Per the patient's daughter and medical records reviewed, there has been a change  in cognitive functioning and a reasonable suspicion of dementia (rule out AD).  Clinical Decision Making: In considering the patient's current level of functioning, level of presumed impairment, nature of symptoms, emotional and behavioral responses during the interview, level of literacy, and observed level of motivation, a battery of tests was selected and communicated to the psychometrician.   Following the clinical interview/neurobehavioral status exam, the patient completed this full battery of neuropsychological testing with my psychometrician under my supervision (see separate note).   PLAN: The patient's daughter will return to see me for a follow-up session at which time her test performances and my impressions and treatment recommendations will be reviewed in detail.  Evaluation ongoing; full report to follow.

## 2017-08-25 ENCOUNTER — Encounter (HOSPITAL_COMMUNITY): Payer: Self-pay

## 2017-08-25 ENCOUNTER — Ambulatory Visit (HOSPITAL_COMMUNITY)
Admission: RE | Admit: 2017-08-25 | Discharge: 2017-08-25 | Disposition: A | Discharge status: Home / Self Care | Payer: PPO | Source: Ambulatory Visit | Attending: Physician Assistant | Admitting: Physician Assistant

## 2017-08-25 DIAGNOSIS — I2510 Atherosclerotic heart disease of native coronary artery without angina pectoris: Secondary | ICD-10-CM | POA: Insufficient documentation

## 2017-08-25 DIAGNOSIS — J90 Pleural effusion, not elsewhere classified: Secondary | ICD-10-CM | POA: Insufficient documentation

## 2017-08-25 DIAGNOSIS — J9811 Atelectasis: Secondary | ICD-10-CM | POA: Diagnosis not present

## 2017-08-25 DIAGNOSIS — R9389 Abnormal findings on diagnostic imaging of other specified body structures: Secondary | ICD-10-CM | POA: Insufficient documentation

## 2017-08-25 DIAGNOSIS — I700 Atherosclerosis of aorta: Secondary | ICD-10-CM | POA: Insufficient documentation

## 2017-08-25 HISTORY — DX: Malignant (primary) neoplasm, unspecified: C80.1

## 2017-08-25 LAB — POCT I-STAT CREATININE: CREATININE: 1.1 mg/dL — AB (ref 0.44–1.00)

## 2017-08-25 MED ORDER — IOPAMIDOL (ISOVUE-300) INJECTION 61%
100.0000 mL | Freq: Once | INTRAVENOUS | Status: AC | PRN
  Administered 2017-08-25: 75 mL via INTRAVENOUS

## 2017-09-05 DIAGNOSIS — N183 Chronic kidney disease, stage 3 (moderate): Secondary | ICD-10-CM | POA: Diagnosis not present

## 2017-09-05 DIAGNOSIS — I129 Hypertensive chronic kidney disease with stage 1 through stage 4 chronic kidney disease, or unspecified chronic kidney disease: Secondary | ICD-10-CM | POA: Diagnosis not present

## 2017-09-06 ENCOUNTER — Other Ambulatory Visit: Payer: Self-pay | Admitting: Nephrology

## 2017-09-06 DIAGNOSIS — N183 Chronic kidney disease, stage 3 unspecified: Secondary | ICD-10-CM

## 2017-09-06 DIAGNOSIS — I129 Hypertensive chronic kidney disease with stage 1 through stage 4 chronic kidney disease, or unspecified chronic kidney disease: Secondary | ICD-10-CM

## 2017-09-15 ENCOUNTER — Encounter: Payer: Self-pay | Admitting: Neurology

## 2017-09-18 ENCOUNTER — Ambulatory Visit
Admission: RE | Admit: 2017-09-18 | Discharge: 2017-09-18 | Disposition: A | Discharge status: Home / Self Care | Payer: PPO | Source: Ambulatory Visit | Attending: Nephrology | Admitting: Nephrology

## 2017-09-18 DIAGNOSIS — N189 Chronic kidney disease, unspecified: Secondary | ICD-10-CM | POA: Diagnosis not present

## 2017-09-18 DIAGNOSIS — N183 Chronic kidney disease, stage 3 unspecified: Secondary | ICD-10-CM

## 2017-09-18 DIAGNOSIS — I129 Hypertensive chronic kidney disease with stage 1 through stage 4 chronic kidney disease, or unspecified chronic kidney disease: Secondary | ICD-10-CM

## 2017-09-19 NOTE — Progress Notes (Signed)
NEUROPSYCHOLOGICAL EVALUATION   Name:    Courtney [name]  Date of Birth:   1945-07-29 Date of Interview:  08/24/2017 Date of Testing:  08/24/2017   Date of Feedback:  09/21/2017       Background Information:  Reason for Referral:  Courtney [name] is a 72 y.o. female referred by Dr. Metta Clines to assess her current level of cognitive functioning and assist in differential diagnosis. The current evaluation consisted of a review of available medical records, an interview with the patient and her daughter, [name], and the completion of a neuropsychological testing battery. Informed consent was obtained.  History of Presenting Problem:  Ms. Courtney [name] was seen for initial neurologic consultation by Dr. Tomi Likens in December 2015 after she had an episode of behavioral change in which she woke up in the middle of the night, took off all her clothes and urinated on the bedroom floor. She was taken to the ED. Labs including UA were essentially normal, and head CT was normal. When she saw Dr. Tomi Likens on 04/29/2014, MoCA was 17/30. Vitamin B12 was 334. Brain MRI was performed on 05/01/2014 and reportedly showed no acute intracranial abnormality, with slightly advanced and scattered subcortical T2 hyperintensities bilaterally most likely representing chronic microvascular ischemia. Routine EEG on 05/08/2014 was normal. She declined any further testing including neurocognitive evaluation. She returned to our clinic in December 2018 to see Dr. Tomi Likens. Family reported worsening memory. MoCA was 16/30. She was also experiencing depression related to her son's suicide in 2017. She was already on sertraline, memantine and donepezil. She was referred for counseling but therapist did not feel she could benefit from sessions due to significant memory impairment.   At today's appointment (08/24/2017) the patient's daughter relates a history of gradual memory/cognitive decline which seemed exacerbated by her son's suicide in May 2017. Per  her daughter, the patient "seems stuck in that time" and "it's almost like she stopped that day, too". At the present time, the patient is demonstrating significant memory impairment for recent conversations/events and disorientation to time and place. She is not able to manage any instrumental ADLs. She always did only very little driving locally but she stopped driving altogether several years ago. Her husband "has always done everything for her" per her daughter so it is difficult to tell how much is a decline, but the patient does not manage her medications, appointments, finances or housekeeping. Her husband has always done the grocery shopping as well. The patient's self hygiene has declined. She used to take 1-2 baths a day and now her family has to remind her to bathe and sometimes almost force her. She stays in her housecoat. She doesn't do any cooking. She is much more agitated and this has been a major issue lately. She gets very irritated and belligerent when she thinks something happened but it really didn't. For example, she has it in her mind that her husband is still working, so when he gets home she will ask him where he was, and she won't believe him that he wasn't working. She will get very mad at him and cuss at him if she doesn't believe him. She also is more distrustful of her stepchildren and seems to think they are conspiring with her husband to take her money. She did allow her daughter to be financial POA and they recently got this paperwork completed. She is "obsessed" with making sure she has her money, and she keeps several hundred dollars in her purse.  She is also demonstrating reduced attention. She will be watching television or be in a group conversation and then just look away and appear to "zone out" "like she is somewhere else". She is sleeping much more, and will sleep in until 11 am or noon. Her husband has to get her out of bed. She also dozes off during the day. She is  not having any incontinence on a regular basis. She has not had any hallucinations.   She was wheezing and having some difficulty breathing recently. They thought she may have a cold but it wasn't getting better. They saw her doctor this week and she was told she has congestive heart failure. She was given fluid pills and has already lost 11 lbs of fluid this week. She had an ultrasound of the heart and was sent for a CT of the heart which will take place 08/25/2017.   She has some difficulty walking due to knee problems and being so swollen with fluid retention. She has not had any falls.   There is no known family history of dementia.   She never had any mental health treatment until after her son committed suicide. She was put on sertraline and memantine at that time, per her daughter. Now she will cry if she thinks of or talks about her son. Upon direct questioning, the patient denies any significant or persistent sadness, anxiety or stress. She also denies having any concerns about her memory or cognitive functioning. She feels any forgetfulness she is experiencing is probably due to aging.   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 She has been with her current husband for about 35 years. She had two children from previous relationship/marriage; her daughter [name] lives a few miles from her and is very supportive as are Courtney [name]'s two daughters. The patient's son had a history of addiction and mental illness. He committed suicide in May 2017. Alcohol: No alcohol use. Tobacco: Former smoker, quit 30 years ago. SA: Denied.     Medical History:  Past Medical History:  Diagnosis Date  . Cancer ([place])   . Diabetes ([place])   . Hypertension     Current medications:  Outpatient  Encounter Medications as of 09/21/2017  Medication Sig  . acetaminophen-codeine (TYLENOL #3) 300-30 MG per tablet Take 1 tablet by mouth every 6 (six) hours as needed for pain. (Patient not taking: Reported on 04/28/2017)  . amLODipine (NORVASC) 10 MG tablet Take 5 mg by mouth daily.   . baclofen (LIORESAL) 20 MG tablet Take 20 mg by mouth at bedtime as needed for muscle spasms.  . cyanocobalamin 1000 MCG tablet Take 1,000 mcg by mouth daily.  . DiphenhydrAMINE HCl [facility] RELIEF EX) Apply 1 application topically daily as needed (itching).  Marland Kitchen donepezil (ARICEPT) 10 MG tablet Take 10 mg by mouth daily.  . furosemide (LASIX) 20 MG tablet Take 20 mg by mouth.  . levothyroxine (SYNTHROID, LEVOTHROID) 125 MCG tablet Take 150 mcg by mouth daily before breakfast.   . Omega 3 1000 MG CAPS Take 1 capsule by mouth daily.  . sertraline (ZOLOFT) 100 MG tablet Take 100 mg by mouth daily.  . simvastatin (ZOCOR) 10 MG tablet Take 10 mg by mouth at bedtime.  Willeen Niece 100-33 UNT-MCG/ML SOPN Inject 50 Units into the skin as directed.  Marland Kitchen  valsartan (DIOVAN) 320 MG tablet Take 320 mg by mouth daily.   No facility-administered encounter medications on file as of 09/21/2017.      Current Examination:  Behavioral Observations:  Appearance: Neatly, casually and appropriately dressed and groomed Gait: Ambulated independently, no gross abnormalities observed Speech: Fluent; normal rate, rhythm and volume. She does repeat herself (told me on at least 4 different occasions that the woman with her is her daughter). Thought process: Appears linear Affect: Full, euthymic Interpersonal: Pleasant, appropriate Orientation: Oriented to person and place. Disoriented to month, year, date and day of the week. Unable to name the current President and inaccurately named his predecessor Janeice Robinson").  Tests Administered: . Test of Premorbid Functioning (TOPF) . Dementia Rating Scale - Second Edition (DRS-2) . Wechsler Adult  Intelligence Scale-Fourth Edition (WAIS-IV): Coding subtest . [place] Verbal Learning Test - 2nd Edition (CVLT-2) Short Form . Repeatable Battery for the Assessment of Neuropsychological Status (RBANS) Form A:  Figure Copy and Recall subtests  . Neuropsychological Assessment Battery (NAB) Language Module, Form 1: Naming subtest . Controlled Oral Word Association Test (COWAT) . Trail Making Test A and B . Clock drawing test . Geriatric Depression Scale (GDS) 15 Item . Generalized Anxiety Disorder - 7 item screener (GAD-7)  Test Results: Note: Standardized scores are presented only for use by appropriately trained professionals and to allow for any future test-retest comparison. These scores should not be interpreted without consideration of all the information that is contained in the rest of the report. The most recent standardization samples from the test publisher or other sources were used whenever possible to derive standard scores; scores were corrected for age, gender, ethnicity and education when available.   Test Scores:  Test Name Raw Score Standardized Score Descriptor  TOPF 24/70 SS= 84 Low average  DRS-2     Attention 37 ss= 13 High average  Initiation/Perseveration 31 ss= 6 Low average  Construction 6 ss= 10 Average  Conceptualization 37 ss= 10 Average  Memory 11 ss= 2 Impaired  Total Score 122 ss= 5 Borderline  Total Score - education adjusted AEMSS  ss= 5 Borderline  WAIS-IV Subtests     Coding 21/135 ss= 4 Impaired  RBANS Subtests     Figure Copy 19/20 Z= 0.7 High average  Figure Recall 0/20 Z= -3 Severely impaired  CVLT-II Scores     Trial 1 3/9 Z= -2.5 Impaired  Trial 4 3/9 Z= -2.5 Impaired  Trials 1-4 total 12/36 T= 18 Severely impaired  SD Free Recall 1/9 Z= -2 Impaired  LD Free Recall 0/9 Z= -2 Impaired  LD Cued Recall 1/9 Z= -3 Severely impaired  Recognition Discriminability 8/9 hits, 7 false positives Z= -1.5 Borderline  Forced Choice Recognition 9/9   WNL  NAB Language subtest     Naming 19/31 T= 20 Severely impaired  COWAT-FAS 22 T= 39 Low average  COWAT-Animals 7 T= 28 Impaired  Trail Making Test A  84" 0 errors T= 22 Severely impaired  Trail Making Test B  287" 3 errors T= 26 Impaired  Clock Drawing   WNL  GDS-15 4/15  WNL  GAD-7 2/21  WNL      Description of Test Results:  Premorbid verbal intellectual abilities were estimated to have been within the low average range based on a test of word reading. This is consistent with relatively low level of education. On an omnibus measure of cognitive functioning and dementia screening tool (DRS-2), she performed within the borderline range and her overall  score was indicative of dementia.  Psychomotor processing speed was impaired. Basic auditory attention was high average. Visual-spatial construction was average to high average. Language abilities were impaired. Specifically, confrontation naming was severely impaired, and semantic verbal fluency was impaired. On the memory scale of the DRS-2, she performed in the impaired range. On a more robust measure of verbal memory, encoding and acquisition of non-contextual information (i.e., word list) was severely impaired. After a brief distracter task, free recall was impaired (1/9 items). After a delay, free recall was impaired (0/9 items). Cued recall was severely impaired (1/9 items). Performance on a yes/no recognition task was impaired due to high number of false positive errors. With regard to non-verbal memory, delayed free recall of visual information was severely impaired. Executive functioning was variable. Mental flexibility and set-shifting were impaired on Trails B. Verbal fluency with phonemic search restrictions was low average. Abstract reasoning and conceptualization were average. Performance on tasks measuring initiation and perseveration was low average. Performance on a clock drawing task was normal.   On self-report measures of  mood, the patient's responses were not indicative of clinically significant depression or anxiety at the present time.    Clinical Impressions: Mild dementia most likely secondary to Alzheimer's disease, with behavioral disturbance. Results of cognitive testing indicate significant impairment in multiple domains of cognitive functioning, particularly in temporal orientation, her ability to learn and remember new information, confrontation naming, and psychomotor processing speed. There is also evidence of executive dysfunction. Additionally, there is evidence that the patient's cognitive deficits are interfering with her ability to manage complex ADLs (no longer manages any of these). As such, diagnostic criteria for a dementia syndrome are met.  The patient's cognitive profile and clinical features are most suggestive of Alzheimer's disease. I would characterize this as mild stage. Her behavioral changes and paranoid ideation appear to be secondary to her dementia.   Recommendations/Plan: Based on the findings of the present evaluation, the following recommendations are offered:  1. Her family will benefit from support and education regarding dementia caregiving. I provided information on managing behaviors in dementia, as well as an upcoming lunch and learn on this topic at [employer]. The family may want to attend a dementia caregiver support group as well. 2. The patient may benefit from attending an adult day health program which would also give her family/husband some respite. 3. Pharmacological intervention for behavioral disturbance, paranoid ideation and agitation may need to be considered at this point. She is on Zoloft but may benefit from a low dose of Seroquel, pending physician review and agreement. 4. She appears to be an appropriate candidate for cholinesterase inhibitor therapy. She is already on Aricept.   Feedback to Patient: Lonie Peak Murthy's daughter/PoA and  husband returned for a feedback appointment on 09/21/2017 to review the results of her neuropsychological evaluation with this provider. 55 minutes face-to-face time was spent reviewing her test results, my impressions and my recommendations as detailed above.    Total time spent on this patient's case: 95 minutes for neurobehavioral status exam with psychologist (CPT code [phone], [phone]); 90 minutes of testing/scoring by psychometrician under psychologist's supervision (CPT codes [phone], [phone] units); 170 minutes for integration of patient data, interpretation of standardized test results and clinical data, clinical decision making, treatment planning and preparation of this report, and interactive feedback with review of results to the patient/family by psychologist (CPT codes [phone], [phone] units).      Thank you for your referral of Jenevieve Kirschbaum  Lynnae Sandhoff. Please feel free to contact me if you have any questions or concerns regarding this report.

## 2017-09-20 ENCOUNTER — Ambulatory Visit: Payer: Self-pay | Admitting: "Endocrinology

## 2017-09-21 ENCOUNTER — Encounter: Payer: Self-pay | Admitting: Psychology

## 2017-09-21 ENCOUNTER — Ambulatory Visit: Payer: PPO | Admitting: Psychology

## 2017-09-21 DIAGNOSIS — F0281 Dementia in other diseases classified elsewhere with behavioral disturbance: Secondary | ICD-10-CM

## 2017-09-21 DIAGNOSIS — G301 Alzheimer's disease with late onset: Secondary | ICD-10-CM

## 2017-09-21 DIAGNOSIS — F02818 Dementia in other diseases classified elsewhere, unspecified severity, with other behavioral disturbance: Secondary | ICD-10-CM

## 2017-09-21 NOTE — Patient Instructions (Signed)
Clinical Impressions: Mild dementia most likely secondary to Alzheimer's disease, with behavioral disturbance. Results of cognitive testing indicate significant impairment in multiple domains of cognitive functioning, particularly in temporal orientation, her ability to learn and remember new information, confrontation naming, and psychomotor processing speed. There is also evidence of executive dysfunction. Additionally, there is evidence that the patient's cognitive deficits are interfering with her ability to manage complex ADLs (no longer manages any of these). As such, diagnostic criteria for a dementia syndrome are met.  The patient's cognitive profile and clinical features are most suggestive of Alzheimer's disease. I would characterize this as mild stage. Her behavioral changes and paranoid ideation appear to be secondary to her dementia.   Recommendations/Plan: Based on the findings of the present evaluation, the following recommendations are offered:  1. Her family will benefit from support and education regarding dementia caregiving. I provided information on managing behaviors in dementia, as well as an upcoming lunch and learn on this topic at [employer]. The family may want to attend a dementia caregiver support group as well. 2. The patient may benefit from attending an adult day health program which would also give her family/husband some respite. 3. Pharmacological intervention for behavioral disturbance, paranoid ideation and agitation may need to be considered at this point. She is on Zoloft but may benefit from a low dose of Seroquel, pending physician review and agreement. 4. She appears to be an appropriate candidate for cholinesterase inhibitor therapy. She is already on Aricept.

## 2017-10-19 DIAGNOSIS — N183 Chronic kidney disease, stage 3 (moderate): Secondary | ICD-10-CM | POA: Diagnosis not present

## 2017-10-27 ENCOUNTER — Ambulatory Visit: Payer: PPO | Admitting: Neurology

## 2017-10-27 ENCOUNTER — Other Ambulatory Visit: Payer: Self-pay | Admitting: Neurology

## 2017-10-27 ENCOUNTER — Telehealth: Payer: Self-pay | Admitting: Neurology

## 2017-10-27 MED ORDER — QUETIAPINE FUMARATE 25 MG PO TABS: 25.0000 mg | ORAL_TABLET | Freq: Every day | ORAL | 2 refills | Status: DC

## 2017-10-27 NOTE — Telephone Encounter (Signed)
I was scheduled to see Blaine Hamper today at 9:50.  Her daughter, Danford Bad, came alone.  She said her mother refused to go.  She reports that she has been more agitated.  She accuses her daughter of stealing her money or wanting to put her in a home.  Due to her chronic pain in her legs, she is unable to perform chores such as washing the clothes or cleaning the house.  She just watches TV all day.  She needs coaxing from her husband to take a bath.  When she dresses, she doesn't care if the clothes are stained.  The agitation and combativeness (non-physical) is most problematic.  After discussion, we decided to try Seroquel 25mg  at bedtime.  We can increase dose as needed/tolerated.  We discussed side effects such as the black box warning regarding increased risk of morbidity and mortality.  I strongly recommended referral to a psychiatrist since a lot of her depression is situational (related to the death of her son).

## 2017-11-09 DIAGNOSIS — Z0000 Encounter for general adult medical examination without abnormal findings: Secondary | ICD-10-CM | POA: Diagnosis not present

## 2017-11-09 DIAGNOSIS — Z6841 Body Mass Index (BMI) 40.0 and over, adult: Secondary | ICD-10-CM | POA: Diagnosis not present

## 2017-11-09 DIAGNOSIS — E1129 Type 2 diabetes mellitus with other diabetic kidney complication: Secondary | ICD-10-CM | POA: Diagnosis not present

## 2017-11-09 DIAGNOSIS — Z1389 Encounter for screening for other disorder: Secondary | ICD-10-CM | POA: Diagnosis not present

## 2017-12-11 ENCOUNTER — Other Ambulatory Visit: Payer: Self-pay

## 2017-12-11 NOTE — Patient Outreach (Signed)
[place] [facility] [facility]) Care Management  12/11/2017  Courtney [name] 13-Aug-1945 [id number]   Telephone Screen  Referral Date: 12/11/17 Referral Source: HTA Concierge Referral Reason: " DM , family is requesting assistance in the home with medications, bathing and ADLs, member has cognitive issues" Insurance: HTA   Outreach attempt # 1 to patient. No answer at present. RN CM left HIPAA compliant voicemail message along with contact info.       Plan: RN CM will make outreach attempt to patient/dtr within 3-4 business days.  RN CM will send unsuccessful outreach letter to patient.   Enzo Montgomery, RN,BSN,CCM [place] [facility] Telephonic Care Management Coordinator Direct Phone: [phone] Toll Free: [phone] Fax: [phone]

## 2017-12-13 ENCOUNTER — Other Ambulatory Visit: Payer: Self-pay

## 2017-12-13 NOTE — Patient Outreach (Signed)
[facility] [facility]) Care Management  12/13/2017  Courtney [name] [birth year] [id number]   Telephone Screen  Referral Date:12/11/17 Referral Source:HTA Concierge Referral Reason: " DM , family is requesting assistance in the home with medications, bathing and ADLs, member has cognitive issues" Insurance:HTA    Incoming call from patient's daughter-Courtney [name]. Screening completed.   Social: Patient resides in her home along with her spouse. Daughter voices that patient is dependent and requires assistance with ADLs. Spouse handles all IADLs. Spouse also suffers from several medical issues of his own. Daughter voices no recent falls in the home. She states that patient refuses to use any assistive devices. Spouse or daughter normally takes patient to MD appts. However, daughter voices that lately patient has been refusing to go to MD appts. Daughter voices that she is concerned about patient's personal care/hygiene. She voices that sometimes patient will go a week without letting someone help and bathe her. She states that they are looking for any type of in home services that will be able to assist them in caring for patient. Daughter state that she thinks they make too money to qualify for Medicaid.    Conditions: Per chart review, patient has PMH of chronic leg pain, depression, late onset Alzheimer's disease with behavioral disturbance, HTN and Diabetes. Daughter voices that patient's spouse is "doing everything for patient" including administering her insulin and checking cbgs. She is unsure what cbgs have ben running. Last A1C on file was 9.6(11/09/17). Daughter voices that patient [name] having problems "with her kidneys."  Daughter voices that patient has "huge knees" due to swelling and ongoing issues related to this. She also states that patient has bad depression. She states that it is related to patient's son hanging himself and committing suicide in May 2017. Per family, patient  has not been the same since then and it is like she is "stuck in the period of time." Daughter voices some frustrations in managing and caring for patient. She would like any support and eduction that she can get to help manage patient's medical conditions and to know how to handle patient as her illnesses progress.   Medications: Per daughter she thinks patient is taking about 14 meds. She voices that patient's spouse handles meds and administers them to her. She denies any issues with affording and/or managing meds.   Appointments: Daughter states that patient is followed by PCP at [facility]. She was scheduled to have renal MD appt today but family cancelled as patient refused to go. She also sees neurologist. Daughter voices that neuro MD has recommended psychiatrist eval.   Advance Directives: Daughter reports that she has dual POA. No copy on file.  Consent: [facility] services reviewed and discussed with daughter. Verbal consent for services given. DAUGHTER HAS REQUESTED THAT ALL THN CONTACT BE MADE BY CONTACTING HER AT [phone].  Plan: RN CM will send [facility] SW referral for possible community resources and in home support to assist caregivers. RN CM will send [facility] community nurse referral for further in home eval/assessment of care needs and mgmt of chronic conditions and symptoms.    Enzo Montgomery, RN,BSN,CCM [place] [facility] Telephonic Care Management Coordinator Direct Phone: [phone] Toll Free: [phone] Fax: [phone]

## 2017-12-13 NOTE — Patient Outreach (Signed)
[facility] [facility]) Care Management  12/13/2017  Courtney [name] Sep 02, 1945 [id number]   Telephone Screen  Referral Date: 12/11/17 Referral Source: HTA Concierge Referral Reason: " DM , family is requesting assistance in the home with medications, bathing and ADLs, member has cognitive issues" Insurance: HTA    Outreach attempt #2 to patient. Spouse answered the phone and reported patient was asleep and does not handle phone calls due to her mental status. RN CM briefly explained purpose of call. Spouse shares that patient is becoming more and more resistant to him and his daughter is assisting her and they need help in the home. Spouse voiced that he would prefer for RN CM to complete screening call with daughter-[name]. Advised spouse that RN CM had attempted to contact daughter a few days ago-left message and has not heard anything back form her. He states that she works during the day and the best time to reach her would be early in the mornings or late in the evenings. Advised that RN CM would try to reach daughter again. Encouraged him that he speaks with daughter to let her know RN CM trying to reach her and to return RN CM call. Spouse voiced understanding.      Plan: RN CM will make outreach attempt to daughter within 3-4 business days.   Enzo Montgomery, RN,BSN,CCM [place] [facility] Telephonic Care Management Coordinator Direct Phone: [phone] Toll Free: [phone] Fax: [phone]

## 2017-12-15 ENCOUNTER — Other Ambulatory Visit: Payer: Self-pay | Admitting: *Deleted

## 2017-12-15 NOTE — Patient Outreach (Signed)
Telephone call to patient's daughter [name] to schedule initial home visit, scheduled for next week.  PLAN See pt for initial home visit next week  Jacqlyn Larsen [facility] [place] Coordinator [phone]

## 2017-12-18 ENCOUNTER — Other Ambulatory Visit: Payer: Self-pay

## 2017-12-18 NOTE — Patient Outreach (Signed)
[place] [facility]) Care Management  12/18/2017  MAREESA GATHRIGHT 07-Jan-1946 [id number]   Initial outreach to patient's daughter, [name], regarding social work referral.  BSW unable to leave voicemail because mailbox was full.  Will make another attempt to reach her within four business days. Unsuccessful outreach letter mailed by Audree Camel, on 12/13/17.    [name], BSW Social Worker [phone]

## 2017-12-21 ENCOUNTER — Other Ambulatory Visit: Payer: Self-pay

## 2017-12-21 NOTE — Patient Outreach (Signed)
[facility]) Care Management  12/21/2017  Courtney [name] 15-Sep-1945 [id number]   Second outreach regarding social work referral.  Patient's daughter reported that she was traveling for work and requested to call BSW back later in the day.  BSW will call her again within four business days is no return call.  [name], BSW Social Worker [phone]

## 2017-12-22 ENCOUNTER — Ambulatory Visit: Payer: Self-pay | Admitting: *Deleted

## 2017-12-22 ENCOUNTER — Other Ambulatory Visit: Payer: Self-pay | Admitting: *Deleted

## 2017-12-22 ENCOUNTER — Other Ambulatory Visit: Payer: Self-pay

## 2017-12-22 NOTE — Patient Outreach (Signed)
Message received from [facility] telephonic case manager Enzo Montgomery that patient's daughter [name] called in and cancelled today's home visit due to pt is not having a good day and will not allow a home visit.  RN CM called [name] at [phone] to reschedule and no answer to telephone, left voicemail requesting return phone call, unsuccessful outreach letter mailed to pt home.  PLAN Outreach in 3-4 business days  Jacqlyn Larsen [facility], [place] [phone]

## 2017-12-22 NOTE — Patient Outreach (Signed)
[place] [facility]) Care Management  12/22/2017  ANSHIKA PETHTEL Mar 29, 1946 [id number]   BSW received phone call from patient's daughter, [name].  She reported that she was trying to get in touch with RNCM, Jacqlyn Larsen, to cancel the home visit scheduled for today but did not have her contact information.  Per [name], Mrs. Foskey was originally agreeable to the home visit but is refusing at this point.   BSW informed Jacqlyn Larsen of this and provided [name] with her contact information in order to reschedule if Mrs. Derk changes her mind. BSW talked with [name] about the referral for community resources.  She reported that Mrs. Franzen spouse is her primary caregiver.  BSW and [name] discussed applying for Medicaid but she reported that, based on their income, they do not qualify.  BSW agreed to send caregiver resources in [facility] as well as resources specific to Alzheimer's disease.  BSW will follow up next week to ensure receipt of resources.  Brochures/information on the following were mailed: Research scientist (medical) on Aging Aging, Disability and [place] [facility] [place] Demential Family Support Program [employer] Caregiver Support Groups [facility] [place]  Booklets from the [employer] on Aging for the following were mailed: Understanding Alzheimer's Disease   Caring for a Person with Alzheimer's Disease   Alzheimer's Disease Fact Sheet  Geographical information systems officer, [place] Worker [phone]

## 2017-12-26 ENCOUNTER — Ambulatory Visit: Payer: PPO

## 2017-12-27 ENCOUNTER — Other Ambulatory Visit: Payer: Self-pay | Admitting: *Deleted

## 2017-12-27 NOTE — Patient Outreach (Signed)
Telephone call to pt daughter [name] to reschedule home visit, no answer to telephone and mailbox is full.  PLAN Outreach in 3-4 business days  Jacqlyn Larsen [facility], [place] [phone]

## 2017-12-29 ENCOUNTER — Other Ambulatory Visit: Payer: Self-pay

## 2017-12-29 ENCOUNTER — Ambulatory Visit: Payer: PPO

## 2017-12-29 NOTE — Patient Outreach (Signed)
[place] [facility]) Care Management  12/29/2017  Courtney [name] Jun 19, 1945 [id number]   BSW attempted to contact patient's daughter to ensure receipt of resources mailed but she did not answer and her voicemail box was full.  BSW will attempt to reach her again within four business days.  [name], BSW Social Worker [phone]

## 2018-01-01 ENCOUNTER — Other Ambulatory Visit: Payer: Self-pay | Admitting: *Deleted

## 2018-01-01 NOTE — Patient Outreach (Signed)
Telephone call to patient's daughter [name] (3rd attempt) , no answer to telephone and mail box full.  PLAN Close case in 3-4 business days if no further contact with pt, daughter  Jacqlyn Larsen [facility], [place] Coordinator [phone]

## 2018-01-03 ENCOUNTER — Other Ambulatory Visit: Payer: Self-pay

## 2018-01-03 NOTE — Patient Outreach (Signed)
[place] [facility]) Care Management  01/03/2018  Courtney [name] [birth year] [id number]   Follow up call to patient's daughter, [name], to ensure receipt of resources mailed.  She reported that [name] have been out of town for the past week so she is not certain if the resources arrived.  BSW informed her that RNCM, Jacqlyn Larsen, has been attempting to reach her to reschedule the home visit if desired. [name] reported that she has been dealing with another family situation and that is why she has been hard to reach. She requested that Almyra Free hold off on closing [name] case and agreed to call her on Monday, 01/08/18.   BSW communicated this to [place].  BSW will follow up again next week to ensure receipt of resources mailed.   [name], BSW Social Worker [phone]

## 2018-01-09 ENCOUNTER — Other Ambulatory Visit: Payer: Self-pay | Admitting: *Deleted

## 2018-01-09 NOTE — Patient Outreach (Signed)
Text message received from patient's daughter [name] [phone]) requesting home visit the week after Labor Day in September,  RN CM to see pt for home visit week of 01/29/18.  PLAN See pt for initial home visit week of 01/29/18  Jacqlyn Larsen [facility], [place] Coordinator [phone]

## 2018-01-10 ENCOUNTER — Ambulatory Visit: Payer: Self-pay

## 2018-01-10 ENCOUNTER — Other Ambulatory Visit: Payer: Self-pay

## 2018-01-10 NOTE — Patient Outreach (Signed)
[place] [facility]) Care Management  01/10/2018  Courtney [name] 1946-02-25 [id number]   BSW attempted to contact patient's daughter, Courtney [name], to ensure receipt of resources that were mailed on 12/22/17.  No answer and voicemail box was full.  Will attempt to reach her again within four business days.  [name], BSW Social Worker [phone]

## 2018-01-15 ENCOUNTER — Other Ambulatory Visit: Payer: Self-pay

## 2018-01-15 NOTE — Patient Outreach (Signed)
[place] [facility]) Care Management  01/15/2018  BLINDA TUREK [birth year] [id number]   Second follow up attempt to patient's daughter, [name], to ensure receipt of resources that were mailed on 12/22/17.  No answer and voicemail box was full.  Will make final attempt to reach her again within four business days.  [name], BSW Social Worker [phone]

## 2018-01-16 ENCOUNTER — Telehealth: Payer: Self-pay | Admitting: Neurology

## 2018-01-16 ENCOUNTER — Other Ambulatory Visit: Payer: Self-pay

## 2018-01-16 NOTE — Telephone Encounter (Signed)
Called and spoke with [name]. When Dr Tomi Likens spoke with [name] on 10/27/17, he started Pt on Serequel 25 mg QHS. [name] is asking if can be increased.  She talked with [facility] social worker this afternoon, and a nurse is to come to the home in September to make recommendations.

## 2018-01-16 NOTE — Telephone Encounter (Signed)
Patient daughter [name] states medication seroquel is not seeming to help and wants to know if they should try upping the dose. Patient daughter requesting a call back instead of patient.

## 2018-01-16 NOTE — Telephone Encounter (Signed)
If she is primarily agitated at night, increase Seroquel to 50mg  at bedtime.  However, if she is agitated throughout the day, then increase dose to 25mg  twice daily (1 pull in AM and 1 pill at night)

## 2018-01-16 NOTE — Patient Outreach (Signed)
[place] [name]) Care Management  01/16/2018  Courtney [name] May 05, 1946 [id number]   BSW received return phone call from patient's daughter.  She confirmed receipt of resources mailed and thanked BSW for sending them.  BSW closing at this time due to no other social work needs being identified.    [name], BSW Social Worker [phone]

## 2018-01-17 NOTE — Telephone Encounter (Signed)
[name] called back, advised her of recommendation.

## 2018-01-17 NOTE — Telephone Encounter (Signed)
Called [name], went to VM, VM is full, unable to leave message.

## 2018-01-18 ENCOUNTER — Ambulatory Visit: Payer: Self-pay

## 2018-02-01 ENCOUNTER — Ambulatory Visit: Payer: Self-pay | Admitting: *Deleted

## 2018-02-05 ENCOUNTER — Other Ambulatory Visit: Payer: Self-pay | Admitting: *Deleted

## 2018-02-05 NOTE — Patient Outreach (Signed)
[place] [facility]) Care Management  02/05/2018  Courtney [name] [birth year] [id number]    Telephone Assessment initial outreach unsuccessful and RN unable to leave a HIPAA voice message. Will send an outreach letter and allow pt time to respond to the care. Will also rescheduled another telephone contact for pending services.   Raina Mina, RN Care Management Coordinator [place] Office [phone]

## 2018-02-07 ENCOUNTER — Other Ambulatory Visit: Payer: Self-pay | Admitting: *Deleted

## 2018-02-07 NOTE — Patient Outreach (Signed)
[place] [facility]) Care Management  02/07/2018  Courtney [name] 12-24-1945 [id number]  Telephone Assessment (2nd outreach)  RN 2nd outreach attempt unsuccessful however RN able to leave a HIPAA approved voice message requesting a call back. Will further inquire at that time on pt's possible needs for [facility]. Will schedule one additional outreach call to pt next week. Note instructions to contact the daughter [name] [phone]. Note outreach letter was sent.  Raina Mina, RN Care Management Coordinator [place] Office [phone]

## 2018-02-12 ENCOUNTER — Other Ambulatory Visit: Payer: Self-pay | Admitting: *Deleted

## 2018-02-12 NOTE — Patient Outreach (Signed)
[facility]) Care Management  02/12/2018  Courtney [name] Sep 03, 1945 [id number]    Covering for Courtney Larsen, RN  Third unsuccessful attempt to reach family member daughter [name] [phone]) for this pt. Received a text that the party would call back. If no response will plan to close case on 9/27.  Raina Mina, RN Care Management Coordinator [place] Office [phone]

## 2018-02-16 ENCOUNTER — Other Ambulatory Visit: Payer: Self-pay | Admitting: *Deleted

## 2018-02-16 NOTE — Patient Outreach (Signed)
[place] [facility]) Care Management  02/16/2018  Courtney [name] 12-11-1945 [id number]    Unsuccessful with several attempts and no response to outreach letter sent to this pt. Case will be closed and provider will be notified of pt's disposition with [facility] services.  Raina Mina, RN Care Management Coordinator [place] Office [phone]

## 2018-03-08 DIAGNOSIS — I10 Essential (primary) hypertension: Secondary | ICD-10-CM | POA: Diagnosis not present

## 2018-03-08 DIAGNOSIS — Z6841 Body Mass Index (BMI) 40.0 and over, adult: Secondary | ICD-10-CM | POA: Diagnosis not present

## 2018-03-08 DIAGNOSIS — E1129 Type 2 diabetes mellitus with other diabetic kidney complication: Secondary | ICD-10-CM | POA: Diagnosis not present

## 2018-05-14 DIAGNOSIS — R3129 Other microscopic hematuria: Secondary | ICD-10-CM | POA: Diagnosis not present

## 2018-05-14 DIAGNOSIS — J209 Acute bronchitis, unspecified: Secondary | ICD-10-CM | POA: Diagnosis not present

## 2018-05-14 DIAGNOSIS — Z6839 Body mass index (BMI) 39.0-39.9, adult: Secondary | ICD-10-CM | POA: Diagnosis not present

## 2018-05-15 ENCOUNTER — Other Ambulatory Visit: Payer: Self-pay | Admitting: *Deleted

## 2018-05-15 NOTE — Patient Outreach (Signed)
[place] [facility]) Care Management  05/15/2018  Courtney [name] 1945-11-20 [id number]   Telephone Screen  Referral Date:05/15/18 Referral Source: Referral from Courtney Mina, RN- [facility] RN CM community  Referral Reason: This pt was assigned to me via coverage for Courtney [name] several months ago however unsuccessful contacts and case was closed. RN received a voice message yesterday requesting services. Note I believe the pt lives in [place]. Please refer to area case manager to intervene. The came from the daughter Courtney [name] [phone] and indicated is the POA. Insurance: HTA   Outreach attempt # 1 Patient's daughter, Courtney [name]  is able to verify HIPAA Reviewed and addressed referral to [facility] with Courtney [name], POA  Courtney [name] confirms Courtney [name] was active with [facility] previously but the patient and husband did not approve initially of the referral to [facility] for assistance and when they finally became comfortable and agreed to the referral other issue occurred to prevent completion of home visit  Courtney [name] confirms a recent primary MD appointment was completed In that appointment Courtney [name] was evaluated for cold and blood in her urine related to a hx of kidney stones in which is being monitored. There fore she is doing okay medically Courtney [name] concern today is that Courtney [name]'s alzheimer's dementia is progressing faster rate over the last 3 months. Courtney [name] provided an example of increase incontinence of stool and urine. Courtney [name] voices that Courtney [name] is recognizing the incontinence and is able to independently clean herself up but is noted to be more forgetful and making bad choices. Courtney [name] reports Courtney [name] is noted to not want to bathe and not being able to recall recent people and events. Courtney [name] states it took 14 days for the family to convince Courtney [name] to bathe and to go to the beauty salon. Courtney [name] reports Courtney [name] is able to recall events before the death of her son about 2 1/2 years ago but not be able to recognize  Courtney [name]'s new 5 month granddaughter  Courtney [name] reports Courtney [name] gets confused to who the 91 month old belongs to.   Courtney [name] is aware that "they make too much money to get medicaid" at this time but is working with a Chief Executive Officer and Courtney [name] to try assist with this and is requesting assist Today Courtney [name] is requesting assistance for home care services, possible home health RN, aide, PT to help work with legs, bathing 'that her insurance will pay for"   [facility] RN CM discussed the out of pocket expense for personal care services without medicaid. Cm unable to get clarity from Courtney [name] on whether or not an application has been submitted for medicaid via DSS already Courtney [name] reports she is aware of the council on aging   Courtney [name] confirms Courtney [name] has been seen by a neurologist, Courtney [name] and Courtney [name] is "on pills and has and appointment to see a neuropsychologist on January twenty first" Courtney [name] reports her "profession is affordable housing" and she is also POA for one of her uncles who is a wheel chair bound veteran and she is aware that an out of pocket for personal care can start at $12/hour. Courtney [name] reports they are trying to sell the Courtney [name]'s "home to down size and has talked to an attorney to get items out of their name"  Social: Courtney [name] lives at home with her 68 year old husband who has been attempting to assist Courtney [name] with ADLs, iADLs and transportation. They have one living daughter, Courtney [name] (pt's POA who works for affordable  housing services) and grandchildren who attempt to assist as much as possible.    Conditions: DM, HTN, osteoarthritis , alzheimer's dementia (on Aricept, Zoloft), depression, HDL, former smoker    Medications: denies concerns with taking medications as prescribed, affording medications, side effects of medications and questions about medications    Appointments: To be seen by a neuropsychiatrist 06/12/2018 Courtney [name], neurologist last seen 04/28/18  Primary MD, Courtney [name] [facility]   Advance Directives: Courtney [name] is POA    Consent: [facility] RN CM reviewed [facility] services with patient. Patient gave verbal consent for services for [facility] .  Plan: [facility] RN CM will refer Courtney Osier to [facility] for community resources to assist Courtney [name] in the home and community related to increased s/s of alzheimer's dementia- to answer questions and/or guidance in getting personal care services, medicare guidelines for personal care services, etc [facility] RN CM assisted by calling primary MD to request assistance with orders to home health services. No answer at the office [facility] RN CM left a message at the [place] office referral prompt to include information about the referral, pt name, daughter's name/contact number and CM return contact number  Routed note to primary MD and neurologist   Joelene Millin L. Lavina Hamman, RN, BSN, [place] Coordinator Office number [phone] Mobile number [phone]  Main [facility] number [phone] Fax number [phone]

## 2018-05-22 ENCOUNTER — Other Ambulatory Visit: Payer: Self-pay

## 2018-05-22 ENCOUNTER — Encounter (INDEPENDENT_AMBULATORY_CARE_PROVIDER_SITE_OTHER): Payer: Self-pay | Admitting: *Deleted

## 2018-05-22 NOTE — Patient Outreach (Signed)
[place] [facility]) Care Management  05/22/2018  Courtney [name] 1946-05-08 [id number]   Unsuccessful outreach attempt to patient's daughter, [name], regarding social work referral for "in-home assistance and dementia resources.   Voicemail box was full so BSW was unable to leave a message.  Will attempt to reach again within four business days.    [name], BSW Social Worker [phone]

## 2018-05-25 ENCOUNTER — Other Ambulatory Visit: Payer: Self-pay

## 2018-05-25 ENCOUNTER — Ambulatory Visit: Payer: Self-pay

## 2018-05-25 NOTE — Patient Outreach (Signed)
[facility]) Care Management  05/25/2018  DOSHA BROSHEARS 11-16-1945 [id number]   Second unsuccessful outreach attempt to patient's daughter, Yaniah Thiemann, regarding social work referral for "in-home assistance and dementia resources."  Voicemail box was full so BSW was unable to leave a message.  Will attempt to reach again within four business days.    [name], BSW Social Worker [phone]

## 2018-05-30 ENCOUNTER — Other Ambulatory Visit: Payer: Self-pay

## 2018-05-30 ENCOUNTER — Ambulatory Visit: Payer: Self-pay

## 2018-05-30 NOTE — Patient Outreach (Signed)
[facility]) Care Management  05/30/2018  Courtney [name] 1946/05/07 [id number]   Third unsuccessful outreach attempt to patient's daughter, [name], regarding social work referral for "in-home assistance and dementia resources." Voicemail box was full so BSW was unable to leave a message. Will close case if no return call by 06/05/18.  [name], BSW Social Worker [phone]

## 2018-06-05 ENCOUNTER — Other Ambulatory Visit: Payer: Self-pay

## 2018-06-05 NOTE — Patient Outreach (Signed)
[place] [facility]) Care Management  06/05/2018  Courtney [name] 1946/03/16 [id number]   [facility] BSW is closing case due to inability to contact.   [name], BSW Social Worker [phone]

## 2018-06-12 DIAGNOSIS — F0280 Dementia in other diseases classified elsewhere without behavioral disturbance: Secondary | ICD-10-CM | POA: Diagnosis not present

## 2018-07-26 NOTE — Progress Notes (Deleted)
NEUROLOGY FOLLOW UP OFFICE NOTE  SHERREE SHANKMAN [id number]  HISTORY OF PRESENT ILLNESS: Kerby Hockley is a 73 year old right-handed woman with hypothyroidism, diabetes and hypertension who follows up for Alzheimer's disease.  She is accompanied by her daughter who supplements history.  UPDATE: Patient was last seen in December 2018.  She underwent neuropsychological testing on 08/24/17, which findings were consistent with mild Alzheimer's disease.  She was supposed to follow up in June but she refused to go to the appointment.  She was exhibiting paranoid delusions such as accusing her daughter of stealing her money or wanting to put her in a nursing home.  She has had progressive difficulty in performing ADLs.   She needs coaxing to take a bath.  When getting dressed, she may wear dirty clothes.  She has been more agitated and combative, but not physical. She was started on Seroquel and currently is taking ***  HISTORY: She has had a change in behavior in 2015.  She started feeling cold all the time.  She once ran out of Synthroid and didn't have it refilled.  She was found to have a very elevated TSH and her Synthroid was restarted with dose adjustment.  She also easily will start crying at any little thing.  She doesn't feel profoundly sad when it occurs.  She can't help it from happening.  Her husband was undergoing daily radiation treatments for prostate cancer.  One day, when he came home from a routine treatment, she asked him where he was.  However, she maintains that she asked him only because it seemed like he got home later from the treatment than usual.  She does not have trouble recognizing faces but she sometimes has trouble recalling names.  She does not have hallucinations or delusions.  She is able to perform all of her ADLs.  She does report some depression, related to her son who is an alcoholic, but this is a longstanding problem.  She hasn't driven for several years due to anxiety.   Her granddaughter is irritated with her because she is unwilling to drive and pick up the great-granddaughter from daycare due to her anxiety.  She presented to the ED at [facility] on 04/24/14 for bizarre behavior.  She woke up that morning, later than usual.  She and her husband sleep in different bedrooms.  She went to her husband's bathroom to use the toilet, which was unusual.  The night before, they but up the Christmas tree and left the paper which wrapped the ornaments on the floor.  That morning, she asked him where the paper came from.  After she went back to her room, her husband later found her on the toilet but noticed she had urinated on the bedroom floor.  When he asked her what was wrong, she looked up at him confused and didn't know what he was taking about.   She does recall the events of this morning, but it is a little fuzzy.  She did not bite her tongue and there is no indication that she experienced convulsions.  Labs, including CBC, CMP, UA, troponins, and ammonia were unremarkable.  TSH was 6.240.  CT of the head was normal.  B12 from 04/29/14 was 334 with a methylmalonic acid level of 204.  MRI of brain without contrast was personally reviewed and demonstrated chronic small vessel ischemic changes.  EEG from 05/08/14 was normal.  She has had continued decline in memory which got worse after her  son committed suicide in May 2017.  She has no known family history of dementia.    PAST MEDICAL HISTORY: Past Medical History:  Diagnosis Date  . Cancer ([place])   . Diabetes ([place])   . Hypertension     MEDICATIONS: Current Outpatient Medications on File Prior to Visit  Medication Sig Dispense Refill  . acetaminophen-codeine (TYLENOL #3) 300-30 MG per tablet Take 1 tablet by mouth every 6 (six) hours as needed for pain. (Patient not taking: Reported on 04/28/2017) 60 tablet 0  . amLODipine (NORVASC) 10 MG tablet Take 5 mg by mouth daily.     . baclofen (LIORESAL) 20 MG tablet Take 20 mg by  mouth at bedtime as needed for muscle spasms.    . cyanocobalamin 1000 MCG tablet Take 1,000 mcg by mouth daily.    . DiphenhydrAMINE HCl [facility] RELIEF EX) Apply 1 application topically daily as needed (itching).    Marland Kitchen donepezil (ARICEPT) 10 MG tablet Take 10 mg by mouth daily.    . furosemide (LASIX) 20 MG tablet Take 20 mg by mouth.    . levothyroxine (SYNTHROID, LEVOTHROID) 125 MCG tablet Take 150 mcg by mouth daily before breakfast.     . Omega 3 1000 MG CAPS Take 1 capsule by mouth daily.    . QUEtiapine (SEROQUEL) 25 MG tablet Take 1 tablet (25 mg total) by mouth at bedtime. 30 tablet 2  . sertraline (ZOLOFT) 100 MG tablet Take 100 mg by mouth daily.    . simvastatin (ZOCOR) 10 MG tablet Take 10 mg by mouth at bedtime.    Willeen Niece 100-33 UNT-MCG/ML SOPN Inject 50 Units into the skin as directed.    . valsartan (DIOVAN) 320 MG tablet Take 320 mg by mouth daily.     No current facility-administered medications on file prior to visit.     ALLERGIES: No Known Allergies  FAMILY HISTORY: Family History  Problem Relation Age of Onset  . Rheum arthritis Mother   . Heart failure Mother   . Hypertension Mother   . Stroke Father   . Diabetes Brother   . Diabetes Sister   . Hypertension Maternal Grandmother    ***.  SOCIAL HISTORY: Social History   Socioeconomic History  . Marital status: Married    Spouse name: Not on file  . Number of children: Not on file  . Years of education: Not on file  . Highest education level: Not on file  Occupational History  . Not on file  Social Needs  . Financial resource strain: Not on file  . Food insecurity:    Worry: Not on file    Inability: Not on file  . Transportation needs:    Medical: Not on file    Non-medical: Not on file  Tobacco Use  . Smoking status: Former Smoker    Packs/day: 0.50    Years: 10.00    Pack years: 5.00    Types: Cigarettes    Last attempt to quit: 05/22/2004    Years since quitting: 14.1  . Smokeless  tobacco: Never Used  Substance and Sexual Activity  . Alcohol use: Yes    Alcohol/week: 0.0 standard drinks    Comment: social  . Drug use: No  . Sexual activity: Never  Lifestyle  . Physical activity:    Days per week: Not on file    Minutes per session: Not on file  . Stress: Not on file  Relationships  . Social connections:    Talks on  phone: Not on file    Gets together: Not on file    Attends religious service: Not on file    Active member of club or organization: Not on file    Attends meetings of clubs or organizations: Not on file    Relationship status: Not on file  . Intimate partner violence:    Fear of current or ex partner: Not on file    Emotionally abused: Not on file    Physically abused: Not on file    Forced sexual activity: Not on file  Other Topics Concern  . Not on file  Social History Narrative  . Not on file    REVIEW OF SYSTEMS: Constitutional: No fevers, chills, or sweats, no generalized fatigue, change in appetite Eyes: No visual changes, double vision, eye pain Ear, nose and throat: No hearing loss, ear pain, nasal congestion, sore throat Cardiovascular: No chest pain, palpitations Respiratory:  No shortness of breath at rest or with exertion, wheezes GastrointestinaI: No nausea, vomiting, diarrhea, abdominal pain, fecal incontinence Genitourinary:  No dysuria, urinary retention or frequency Musculoskeletal:  No neck pain, back pain Integumentary: No rash, pruritus, skin lesions Neurological: as above Psychiatric: No depression, insomnia, anxiety Endocrine: No palpitations, fatigue, diaphoresis, mood swings, change in appetite, change in weight, increased thirst Hematologic/Lymphatic:  No purpura, petechiae. Allergic/Immunologic: no itchy/runny eyes, nasal congestion, recent allergic reactions, rashes  PHYSICAL EXAM: *** General: No acute distress.  Patient appears ***-groomed.  *** body habitus. Head:  Normocephalic/atraumatic Eyes:   Fundi examined but not visualized Neck: supple, no paraspinal tenderness, full range of motion Heart:  Regular rate and rhythm Lungs:  Clear to auscultation bilaterally Back: No paraspinal tenderness Neurological Exam: alert and oriented to person, place, and time. Attention span and concentration intact, recent and remote memory intact, fund of knowledge intact.  Speech fluent and not dysarthric, language intact.  CN II-XII intact. Bulk and tone normal, muscle strength 5/5 throughout.  Sensation to light touch, temperature and vibration intact.  Deep tendon reflexes 2+ throughout, toes downgoing.  Finger to nose and heel to shin testing intact.  Gait normal, Romberg negative.  IMPRESSION: ***  PLAN: ***  Metta Clines, DO  CC: ***

## 2018-07-30 ENCOUNTER — Encounter

## 2018-07-30 ENCOUNTER — Ambulatory Visit: Payer: PPO | Admitting: Neurology

## 2018-09-14 ENCOUNTER — Telehealth: Payer: Self-pay | Admitting: Neurology

## 2018-09-14 MED ORDER — QUETIAPINE FUMARATE 25 MG PO TABS: 25.0000 mg | ORAL_TABLET | Freq: Two times a day (BID) | ORAL | 2 refills | Status: DC

## 2018-09-14 NOTE — Telephone Encounter (Signed)
Courtney [name] is calling regarding Courtney [name] being aggressive and she is wanting to know if a low dose of Seroquel can be called into [facility] in [place]? She said they are moving today and she is very agitated. Thanks

## 2018-09-14 NOTE — Telephone Encounter (Signed)
Called and spoke with [name]. Per chart, Pt can take 25 mg BID. [name] said Pt has not been taking. She asked I send in new Rx.

## 2018-11-07 ENCOUNTER — Ambulatory Visit: Payer: PPO | Admitting: Neurology

## 2018-12-17 IMAGING — US US RENAL
1 series · 14 of 25 positions shown · non-contrast
Comparison: None.

CLINICAL DATA: Chronic renal disease

EXAM:
RENAL / URINARY TRACT ULTRASOUND COMPLETE

[Series 1: us renal · 0.25mm/px · 14 of 40 slices shown]
[im 1/40]
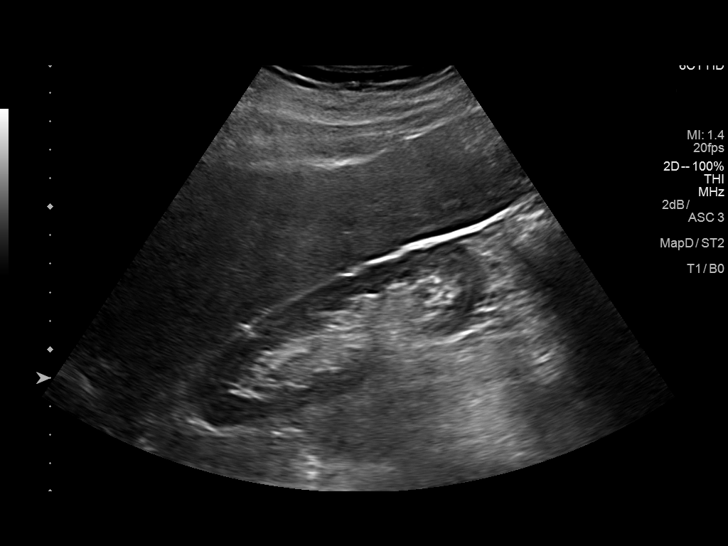
[im 4/40]
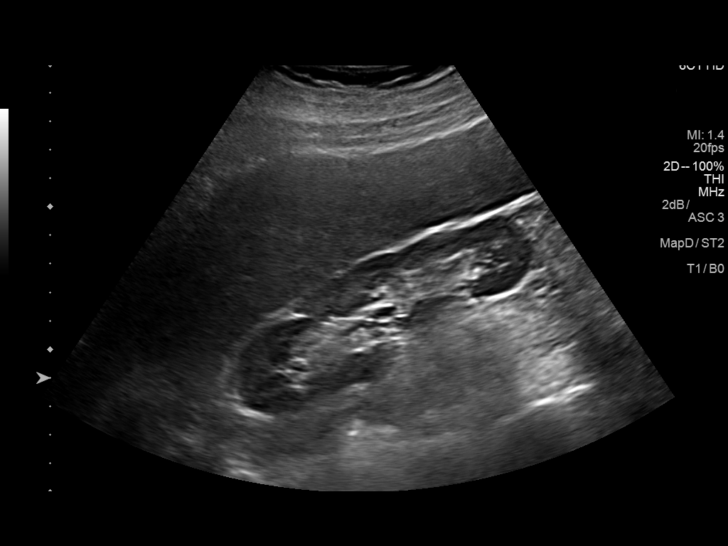
[im 7/40]
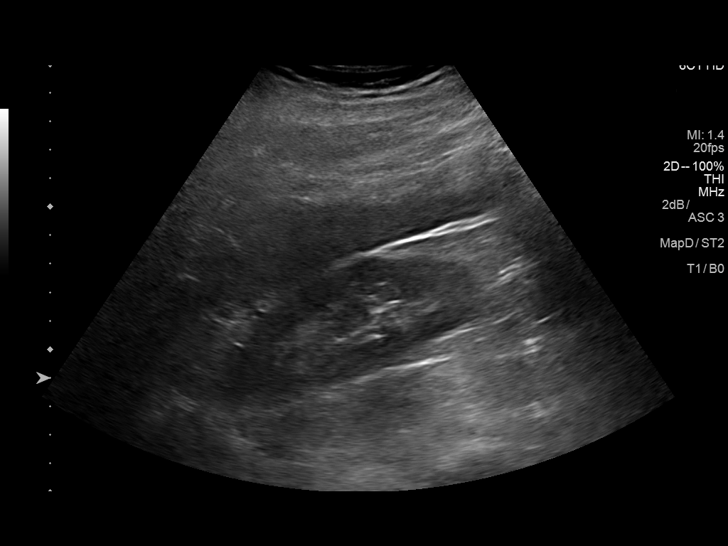
[im 10/40]
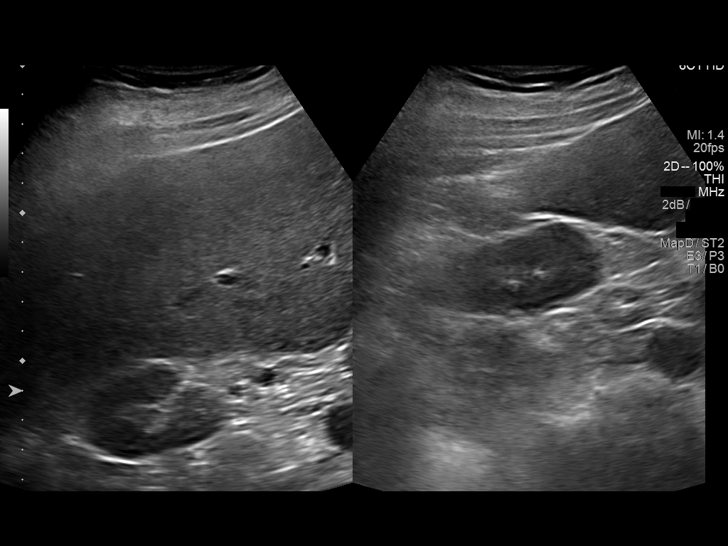
[im 14/40]
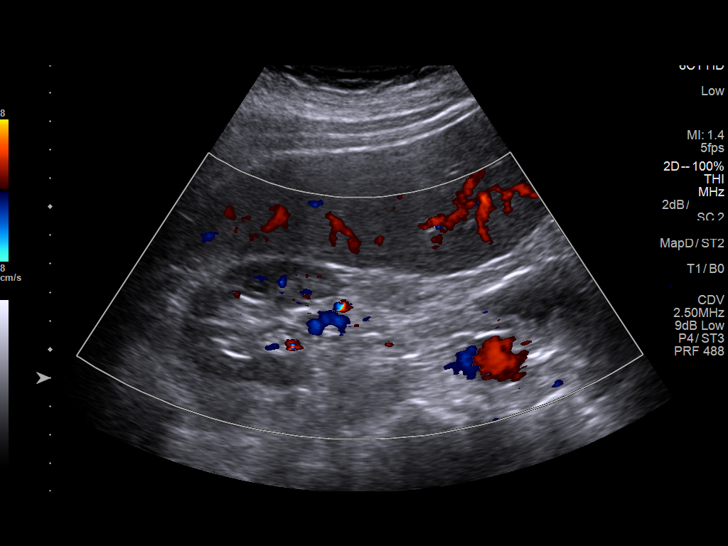
[im 15/40]
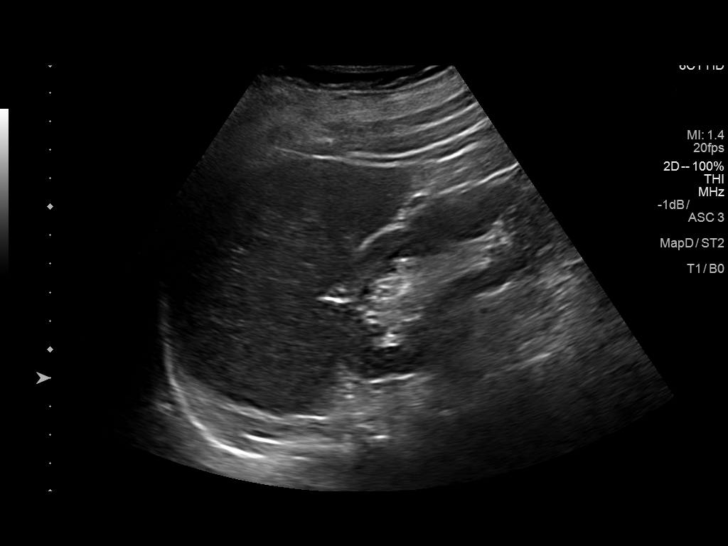
[im 18/40]
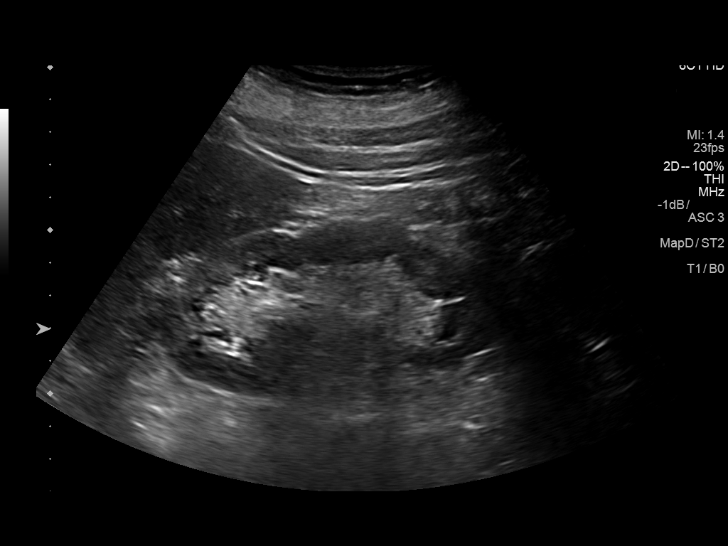
[im 22/40]
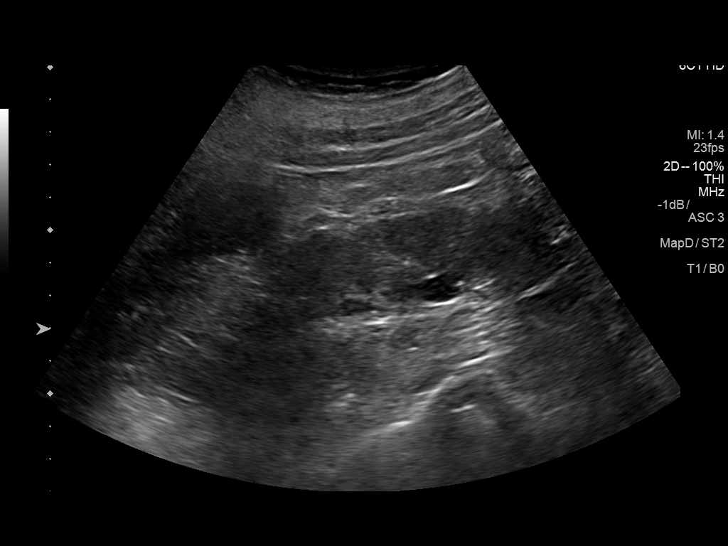
[im 25/40]
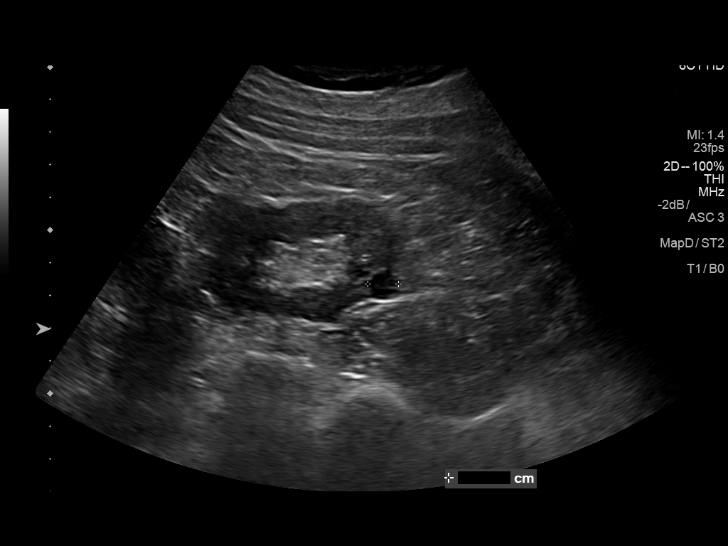
[im 27/40]
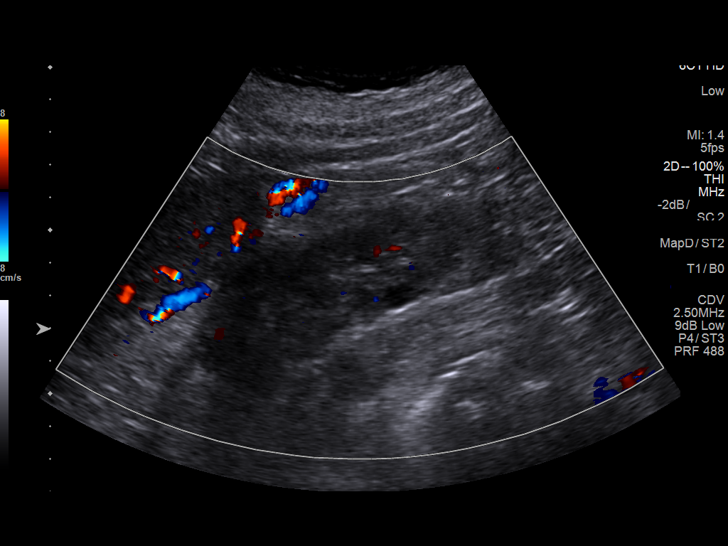
[im 30/40]
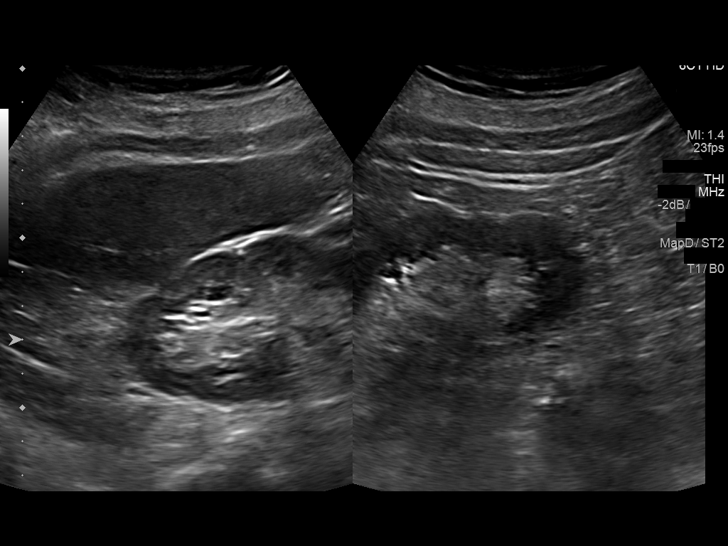
[im 33/40]
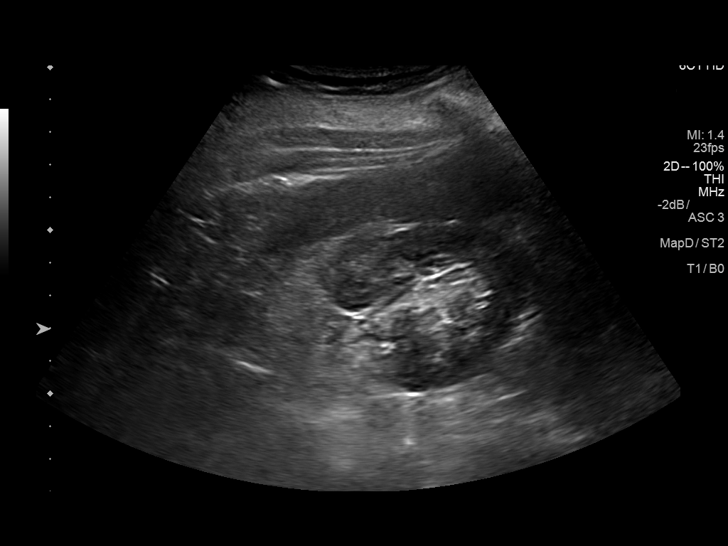
[im 36/40]
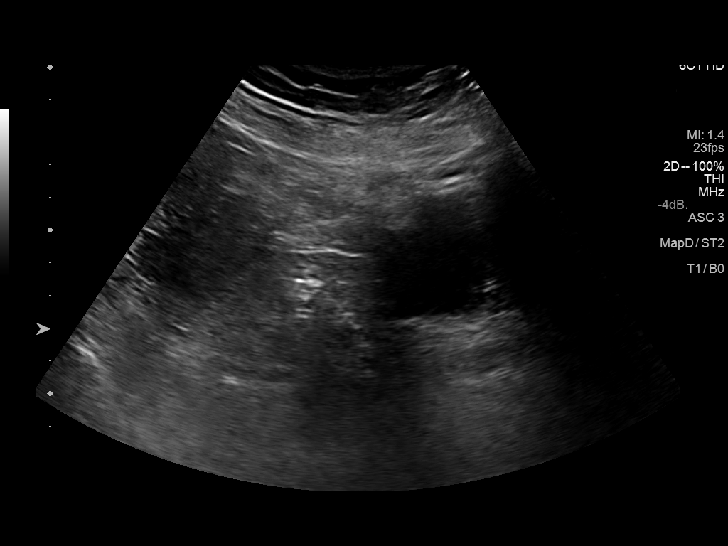
[im 40/40]
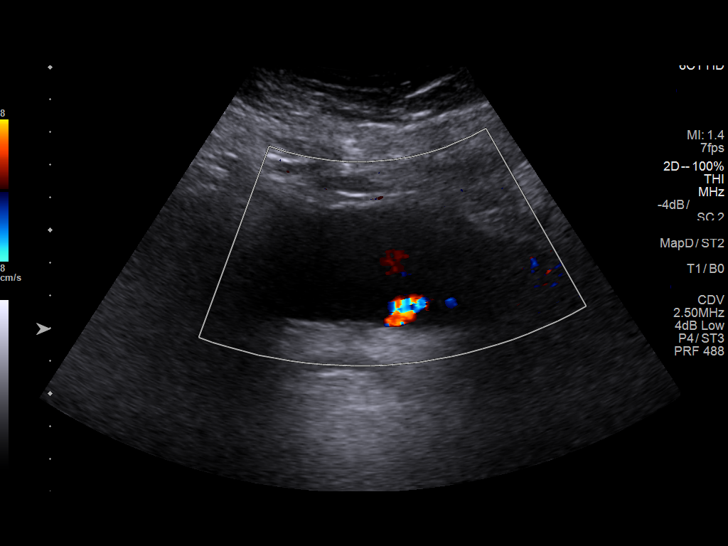

[14 of 25 positions shown; findings below may reference images not displayed]

FINDINGS: Right Kidney:

Length: 11.3 cm.  Mild cortical thinning

Left Kidney:

Length: 10.8 cm.  Small 13 mm cyst which is minimally complicated.

Bladder:

Appears normal for degree of bladder distention.
IMPRESSION: 1. Mild cortical thinning consistent with renal disease.
2. Small cyst in the left kidney.

## 2019-01-17 DIAGNOSIS — Z6839 Body mass index (BMI) 39.0-39.9, adult: Secondary | ICD-10-CM | POA: Diagnosis not present

## 2019-01-17 DIAGNOSIS — Z9119 Patient's noncompliance with other medical treatment and regimen: Secondary | ICD-10-CM | POA: Diagnosis not present

## 2019-01-17 DIAGNOSIS — E7849 Other hyperlipidemia: Secondary | ICD-10-CM | POA: Diagnosis not present

## 2019-01-17 DIAGNOSIS — Z1389 Encounter for screening for other disorder: Secondary | ICD-10-CM | POA: Diagnosis not present

## 2019-01-17 DIAGNOSIS — I10 Essential (primary) hypertension: Secondary | ICD-10-CM | POA: Diagnosis not present

## 2019-01-17 DIAGNOSIS — E1165 Type 2 diabetes mellitus with hyperglycemia: Secondary | ICD-10-CM | POA: Diagnosis not present

## 2019-01-17 DIAGNOSIS — F329 Major depressive disorder, single episode, unspecified: Secondary | ICD-10-CM | POA: Diagnosis not present

## 2019-01-17 DIAGNOSIS — N183 Chronic kidney disease, stage 3 (moderate): Secondary | ICD-10-CM | POA: Diagnosis not present

## 2019-01-17 DIAGNOSIS — E039 Hypothyroidism, unspecified: Secondary | ICD-10-CM | POA: Diagnosis not present

## 2019-01-22 ENCOUNTER — Emergency Department (HOSPITAL_COMMUNITY): Payer: PPO

## 2019-01-22 ENCOUNTER — Other Ambulatory Visit: Payer: Self-pay

## 2019-01-22 ENCOUNTER — Encounter (HOSPITAL_COMMUNITY): Payer: Self-pay | Admitting: Emergency Medicine

## 2019-01-22 ENCOUNTER — Inpatient Hospital Stay (HOSPITAL_COMMUNITY)
Admission: EM | Admit: 2019-01-22 | Discharge: 2019-01-24 | DRG: 644 | Disposition: A | Discharge status: Home Health | Payer: PPO | Attending: Family Medicine | Admitting: Family Medicine

## 2019-01-22 DIAGNOSIS — E872 Acidosis: Secondary | ICD-10-CM | POA: Diagnosis not present

## 2019-01-22 DIAGNOSIS — I10 Essential (primary) hypertension: Secondary | ICD-10-CM | POA: Diagnosis present

## 2019-01-22 DIAGNOSIS — T68XXXA Hypothermia, initial encounter: Secondary | ICD-10-CM | POA: Diagnosis present

## 2019-01-22 DIAGNOSIS — Z79899 Other long term (current) drug therapy: Secondary | ICD-10-CM | POA: Diagnosis not present

## 2019-01-22 DIAGNOSIS — Z20828 Contact with and (suspected) exposure to other viral communicable diseases: Secondary | ICD-10-CM | POA: Diagnosis not present

## 2019-01-22 DIAGNOSIS — E119 Type 2 diabetes mellitus without complications: Secondary | ICD-10-CM | POA: Diagnosis not present

## 2019-01-22 DIAGNOSIS — E871 Hypo-osmolality and hyponatremia: Secondary | ICD-10-CM | POA: Diagnosis not present

## 2019-01-22 DIAGNOSIS — R531 Weakness: Secondary | ICD-10-CM | POA: Diagnosis not present

## 2019-01-22 DIAGNOSIS — Z8261 Family history of arthritis: Secondary | ICD-10-CM

## 2019-01-22 DIAGNOSIS — Z87891 Personal history of nicotine dependence: Secondary | ICD-10-CM

## 2019-01-22 DIAGNOSIS — F0390 Unspecified dementia without behavioral disturbance: Secondary | ICD-10-CM | POA: Diagnosis present

## 2019-01-22 DIAGNOSIS — E039 Hypothyroidism, unspecified: Principal | ICD-10-CM | POA: Diagnosis present

## 2019-01-22 DIAGNOSIS — T381X6A Underdosing of thyroid hormones and substitutes, initial encounter: Secondary | ICD-10-CM | POA: Diagnosis not present

## 2019-01-22 DIAGNOSIS — N179 Acute kidney failure, unspecified: Secondary | ICD-10-CM | POA: Diagnosis not present

## 2019-01-22 DIAGNOSIS — R001 Bradycardia, unspecified: Secondary | ICD-10-CM | POA: Diagnosis present

## 2019-01-22 DIAGNOSIS — Z90710 Acquired absence of both cervix and uterus: Secondary | ICD-10-CM | POA: Diagnosis not present

## 2019-01-22 DIAGNOSIS — R1111 Vomiting without nausea: Secondary | ICD-10-CM | POA: Diagnosis not present

## 2019-01-22 DIAGNOSIS — Z823 Family history of stroke: Secondary | ICD-10-CM | POA: Diagnosis not present

## 2019-01-22 DIAGNOSIS — Z8249 Family history of ischemic heart disease and other diseases of the circulatory system: Secondary | ICD-10-CM | POA: Diagnosis not present

## 2019-01-22 DIAGNOSIS — R680 Hypothermia, not associated with low environmental temperature: Secondary | ICD-10-CM | POA: Diagnosis not present

## 2019-01-22 DIAGNOSIS — Z91138 Patient's unintentional underdosing of medication regimen for other reason: Secondary | ICD-10-CM

## 2019-01-22 DIAGNOSIS — Z794 Long term (current) use of insulin: Secondary | ICD-10-CM

## 2019-01-22 DIAGNOSIS — Z79890 Hormone replacement therapy (postmenopausal): Secondary | ICD-10-CM | POA: Diagnosis not present

## 2019-01-22 LAB — CBC WITH DIFFERENTIAL/PLATELET
Abs Immature Granulocytes: 0.01 10*3/uL (ref 0.00–0.07)
Basophils Absolute: 0.1 10*3/uL (ref 0.0–0.1)
Basophils Relative: 1 %
Eosinophils Absolute: 0.1 10*3/uL (ref 0.0–0.5)
Eosinophils Relative: 1 %
HCT: 41 % (ref 36.0–46.0)
Hemoglobin: 13.2 g/dL (ref 12.0–15.0)
Immature Granulocytes: 0 %
Lymphocytes Relative: 27 %
Lymphs Abs: 1.4 10*3/uL (ref 0.7–4.0)
MCH: 31.1 pg (ref 26.0–34.0)
MCHC: 32.2 g/dL (ref 30.0–36.0)
MCV: 96.7 fL (ref 80.0–100.0)
Monocytes Absolute: 0.2 10*3/uL (ref 0.1–1.0)
Monocytes Relative: 3 %
Neutro Abs: 3.6 10*3/uL (ref 1.7–7.7)
Neutrophils Relative %: 68 %
Platelets: 140 10*3/uL — ABNORMAL LOW (ref 150–400)
RBC: 4.24 MIL/uL (ref 3.87–5.11)
RDW: 14.4 % (ref 11.5–15.5)
WBC: 5.3 10*3/uL (ref 4.0–10.5)
nRBC: 0 % (ref 0.0–0.2)

## 2019-01-22 LAB — URINALYSIS, ROUTINE W REFLEX MICROSCOPIC
Bilirubin Urine: NEGATIVE
Glucose, UA: NEGATIVE mg/dL
Ketones, ur: NEGATIVE mg/dL
Leukocytes,Ua: NEGATIVE
Nitrite: NEGATIVE
Protein, ur: 100 mg/dL — AB
Specific Gravity, Urine: 1.014 (ref 1.005–1.030)
pH: 5 (ref 5.0–8.0)

## 2019-01-22 LAB — TSH: TSH: 154.535 u[IU]/mL — ABNORMAL HIGH (ref 0.350–4.500)

## 2019-01-22 LAB — COMPREHENSIVE METABOLIC PANEL
ALT: 17 U/L (ref 0–44)
AST: 31 U/L (ref 15–41)
Albumin: 3.7 g/dL (ref 3.5–5.0)
Alkaline Phosphatase: 38 U/L (ref 38–126)
Anion gap: 11 (ref 5–15)
BUN: 26 mg/dL — ABNORMAL HIGH (ref 8–23)
CO2: 23 mmol/L (ref 22–32)
Calcium: 8.9 mg/dL (ref 8.9–10.3)
Chloride: 102 mmol/L (ref 98–111)
Creatinine, Ser: 1.83 mg/dL — ABNORMAL HIGH (ref 0.44–1.00)
GFR calc Af Amer: 31 mL/min — ABNORMAL LOW (ref >60)
GFR calc non Af Amer: 27 mL/min — ABNORMAL LOW (ref >60)
Glucose, Bld: 142 mg/dL — ABNORMAL HIGH (ref 70–99)
Potassium: 4 mmol/L (ref 3.5–5.1)
Sodium: 136 mmol/L (ref 135–145)
Total Bilirubin: 0.3 mg/dL (ref 0.3–1.2)
Total Protein: 6.6 g/dL (ref 6.5–8.1)

## 2019-01-22 LAB — LACTIC ACID, PLASMA
Lactic Acid, Venous: 1.6 mmol/L (ref 0.5–1.9)
Lactic Acid, Venous: 1.6 mmol/L (ref 0.5–1.9)

## 2019-01-22 LAB — BLOOD GAS, ARTERIAL
Acid-base deficit: 3.2 mmol/L — ABNORMAL HIGH (ref 0.0–2.0)
Bicarbonate: 21 mmol/L (ref 20.0–28.0)
FIO2: 20
O2 Saturation: 94.4 %
Patient temperature: 33.4
pCO2 arterial: 50.3 mmHg — ABNORMAL HIGH (ref 32.0–48.0)
pH, Arterial: 7.274 — ABNORMAL LOW (ref 7.350–7.450)
pO2, Arterial: 81 mmHg — ABNORMAL LOW (ref 83.0–108.0)

## 2019-01-22 LAB — APTT: aPTT: 31 seconds (ref 24–36)

## 2019-01-22 LAB — TROPONIN I (HIGH SENSITIVITY)
Troponin I (High Sensitivity): 3 ng/L (ref <18)
Troponin I (High Sensitivity): 4 ng/L (ref <18)

## 2019-01-22 LAB — GLUCOSE, CAPILLARY: Glucose-Capillary: 114 mg/dL — ABNORMAL HIGH (ref 70–99)

## 2019-01-22 LAB — PROTIME-INR
INR: 0.9 (ref 0.8–1.2)
Prothrombin Time: 12.4 seconds (ref 11.4–15.2)

## 2019-01-22 LAB — MRSA PCR SCREENING: MRSA by PCR: NEGATIVE

## 2019-01-22 LAB — SARS CORONAVIRUS 2 BY RT PCR (HOSPITAL ORDER, PERFORMED IN ~~LOC~~ HOSPITAL LAB): SARS Coronavirus 2: NEGATIVE

## 2019-01-22 MED ORDER — ACETAMINOPHEN 325 MG PO TABS: 650.0000 mg | ORAL_TABLET | Freq: Four times a day (QID) | ORAL | Status: DC | PRN

## 2019-01-22 MED ORDER — LORAZEPAM 2 MG/ML IJ SOLN
1.0000 mg | Freq: Once | INTRAMUSCULAR | Status: AC
  Administered 2019-01-22: 1 mg via INTRAVENOUS
  Filled 2019-01-22: qty 1

## 2019-01-22 MED ORDER — STERILE WATER FOR INJECTION IJ SOLN
INTRAMUSCULAR | Status: AC
  Administered 2019-01-22: 16:00:00
  Filled 2019-01-22: qty 10

## 2019-01-22 MED ORDER — SODIUM CHLORIDE 0.9 % IV BOLUS
1000.0000 mL | Freq: Once | INTRAVENOUS | Status: AC
  Administered 2019-01-22: 1000 mL via INTRAVENOUS

## 2019-01-22 MED ORDER — LEVOTHYROXINE SODIUM 100 MCG/5ML IV SOLN
100.0000 ug | Freq: Every day | INTRAVENOUS | Status: DC
  Administered 2019-01-22 – 2019-01-24 (×3): 100 ug via INTRAVENOUS
  Filled 2019-01-22 (×4): qty 5

## 2019-01-22 MED ORDER — CHLORHEXIDINE GLUCONATE CLOTH 2 % EX PADS
6.0000 | MEDICATED_PAD | Freq: Every day | CUTANEOUS | Status: DC
  Administered 2019-01-22 – 2019-01-24 (×3): 6 via TOPICAL

## 2019-01-22 MED ORDER — ACETAMINOPHEN 650 MG RE SUPP: 650.0000 mg | Freq: Four times a day (QID) | RECTAL | Status: DC | PRN

## 2019-01-22 MED ORDER — SODIUM CHLORIDE 0.9 % IV SOLN
INTRAVENOUS | Status: DC
  Administered 2019-01-22 – 2019-01-23 (×3): via INTRAVENOUS

## 2019-01-22 MED ORDER — ONDANSETRON HCL 4 MG PO TABS: 4.0000 mg | ORAL_TABLET | Freq: Four times a day (QID) | ORAL | Status: DC | PRN

## 2019-01-22 MED ORDER — SERTRALINE HCL 50 MG PO TABS
100.0000 mg | ORAL_TABLET | Freq: Every morning | ORAL | Status: DC
  Administered 2019-01-23 – 2019-01-24 (×2): 100 mg via ORAL
  Filled 2019-01-22 (×2): qty 2

## 2019-01-22 MED ORDER — DONEPEZIL HCL 5 MG PO TABS
10.0000 mg | ORAL_TABLET | Freq: Every day | ORAL | Status: DC
  Administered 2019-01-22 – 2019-01-23 (×2): 10 mg via ORAL
  Filled 2019-01-22 (×2): qty 2

## 2019-01-22 MED ORDER — INSULIN ASPART 100 UNIT/ML ~~LOC~~ SOLN
0.0000 [IU] | Freq: Three times a day (TID) | SUBCUTANEOUS | Status: DC
  Administered 2019-01-23: 1 [IU] via SUBCUTANEOUS
  Administered 2019-01-23: 2 [IU] via SUBCUTANEOUS

## 2019-01-22 MED ORDER — INSULIN GLARGINE 100 UNIT/ML ~~LOC~~ SOLN
30.0000 [IU] | Freq: Every day | SUBCUTANEOUS | Status: DC
  Administered 2019-01-22: 30 [IU] via SUBCUTANEOUS
  Filled 2019-01-22 (×2): qty 0.3

## 2019-01-22 MED ORDER — ONDANSETRON HCL 4 MG/2ML IJ SOLN: 4.0000 mg | Freq: Four times a day (QID) | INTRAMUSCULAR | Status: DC | PRN

## 2019-01-22 MED ORDER — SODIUM CHLORIDE 0.9 % IV SOLN
1000.0000 mL | INTRAVENOUS | Status: DC
  Administered 2019-01-22 (×2): 1000 mL via INTRAVENOUS

## 2019-01-22 MED ORDER — HALOPERIDOL LACTATE 5 MG/ML IJ SOLN
5.0000 mg | Freq: Once | INTRAMUSCULAR | Status: AC
  Administered 2019-01-22: 5 mg via INTRAVENOUS
  Filled 2019-01-22: qty 1

## 2019-01-22 MED ORDER — ALLOPURINOL 100 MG PO TABS
100.0000 mg | ORAL_TABLET | Freq: Every day | ORAL | Status: DC
  Administered 2019-01-22 – 2019-01-24 (×3): 100 mg via ORAL
  Filled 2019-01-22 (×3): qty 1

## 2019-01-22 MED ORDER — AMLODIPINE BESYLATE 5 MG PO TABS
5.0000 mg | ORAL_TABLET | Freq: Every day | ORAL | Status: DC
  Administered 2019-01-22: 5 mg via ORAL
  Filled 2019-01-22 (×2): qty 1

## 2019-01-22 MED ORDER — POLYETHYLENE GLYCOL 3350 17 G PO PACK: 17.0000 g | PACK | Freq: Every day | ORAL | Status: DC | PRN

## 2019-01-22 MED ORDER — ENOXAPARIN SODIUM 60 MG/0.6ML ~~LOC~~ SOLN
50.0000 mg | SUBCUTANEOUS | Status: DC
  Administered 2019-01-22 – 2019-01-23 (×2): 50 mg via SUBCUTANEOUS
  Filled 2019-01-22 (×2): qty 0.6

## 2019-01-22 NOTE — H&P (Addendum)
History and Physical    Courtney [name] HER:[id number] DOB: 06-04-1945 DOA: 01/22/2019  PCP: Cory Munch, PA-C   Patient coming from: Home  I have personally briefly reviewed patient's old medical records in [place]  Chief Complaint: Low temperature  HPI: Courtney [name] is a 73 y.o. female with medical history significant for hypertension, diabetes mellitus, hypothyroidism.  Patient was brought to the ED with complaints of feeling cold and shivering.  Patient spouse is at bedside and helps with the history.  Patient has some baseline dementia especially with short-term memory, but she is independent with most of her ADLs.  Patient is able to answer a most questions appropriately.  Per spouse low temperature has been ongoing and progressing over the past 2 weeks.  At baseline she does not tolerate cold.  This morning, patient was drenched in sweat. But patient spouse reports that even with home temperature at 76, while he is sweating patient states she is very cold.  No alcohol use.  She has not been exposed to cold temperatures.  Patient has not taking her Synthroid in at least 2 weeks as she has not been able to refill her prescription. Patient denies fever , reports a mild headache last night, without neck pain or stiffness, no difficulty breathing or cough, no pain with urination, no vomiting or loose stools, no mildly of feeling ill.  Apart from her low temperature she is otherwise without complaints.  Ental status is at baseline.  ED Course: Temperature 89.9, heart rate initially 43, blood pressure systolic 856/31.  Vitals improved with rewarming with bair hugger, temperature now 96.1, heart rate 54, blood pressure 169/92.  WBC 5.3.  ABG showed respiratory acidosis pH 7.2 PCO2 50.  Creatinine elevated 1.8.  Sodium 136.  EKG showed sinus bradycardia rate 45 with low voltage.  UA rare bacteria.  For acute abnormality.  TSH at the time ordered and pending.  1 L bolus normal saline given.   Hospitalist to admit for hyponatremia likely secondary to hypothyroidism.  Review of Systems: As per HPI all other systems reviewed and negative.  Past Medical History:  Diagnosis Date   Cancer ([place])    Diabetes ([place])    Hypertension     Past Surgical History:  Procedure Laterality Date   ABDOMINAL HYSTERECTOMY     COLONOSCOPY N/A 05/14/2015   Procedure: COLONOSCOPY;  Surgeon: Rogene Houston, MD;  Location: [facility];  Service: Endoscopy;  Laterality: N/A;  730   OOPHORECTOMY       reports that she quit smoking about 14 years ago. Her smoking use included cigarettes. She has a 5.00 pack-year smoking history. She has never used smokeless tobacco. She reports current alcohol use. She reports that she does not use drugs.  No Known Allergies  Family History  Problem Relation Age of Onset   Rheum arthritis Mother    Heart failure Mother    Hypertension Mother    Stroke Father    Diabetes Brother    Diabetes Sister    Hypertension Maternal Grandmother     Prior to Admission medications   Medication Sig Start Date End Date Taking? Authorizing Provider  allopurinol (ZYLOPRIM) 100 MG tablet Take 1 tablet by mouth daily. 09/10/18  Yes [provider]  amLODipine (NORVASC) 5 MG tablet Take 5 mg by mouth daily.    Yes [provider]  donepezil (ARICEPT) 10 MG tablet Take 10 mg by mouth at bedtime.  03/20/17  Yes [provider]  ferrous sulfate 325 (65 FE) MG tablet Take 325 mg by mouth daily with breakfast.   Yes [provider]  furosemide (LASIX) 20 MG tablet Take 40 mg by mouth every morning.    Yes [provider]  labetalol (NORMODYNE) 200 MG tablet Take 400 mg by mouth 2 (two) times daily.   Yes [provider]  LANTUS SOLOSTAR 100 UNIT/ML Solostar Pen Inject 60 Units into the skin at bedtime.  09/10/18  Yes [provider]  levothyroxine (SYNTHROID) 150 MCG tablet Take 150 mcg by mouth daily before  breakfast.    Yes [provider]  lisinopril (ZESTRIL) 5 MG tablet Take 5 mg by mouth daily.   Yes [provider]  Omega 3 1000 MG CAPS Take 1 capsule by mouth daily.   Yes [provider]  sertraline (ZOLOFT) 100 MG tablet Take 100 mg by mouth every morning.  04/20/17  Yes [provider]    Physical Exam: Vitals:   01/22/19 1700 01/22/19 1724 01/22/19 1800 01/22/19 1847  BP:    (!) 169/92  Pulse:   (!) 53 (!) 54  Resp: 14  15 20   Temp: (!) 94.8 F (34.9 C)   (!) 96.1 F (35.6 C)  TempSrc: Core  Core   SpO2:   96% 98%  Weight:  103.2 kg    Height:  5\' 1"  (1.549 m)      Constitutional: NAD, calm, comfortable Vitals:   01/22/19 1700 01/22/19 1724 01/22/19 1800 01/22/19 1847  BP:    (!) 169/92  Pulse:   (!) 53 (!) 54  Resp: 14  15 20   Temp: (!) 94.8 F (34.9 C)   (!) 96.1 F (35.6 C)  TempSrc: Core  Core   SpO2:   96% 98%  Weight:  103.2 kg    Height:  5\' 1"  (1.549 m)     Eyes: PERRL, lids and conjunctivae normal ENMT: Mucous membranes are moist. Posterior pharynx clear of any exudate or lesions. Neck: normal, supple, no masses, no thyromegaly Respiratory: clear to auscultation bilaterally, no wheezing, no crackles. Normal respiratory effort. No accessory muscle use.  Cardiovascular: Bradycardic but regular rate and rhythm, no murmurs / rubs / gallops. No extremity edema. 2+ pedal pulses. No carotid bruits.  Abdomen: no tenderness, no masses palpated. No hepatosplenomegaly. Bowel sounds positive.  Musculoskeletal: no clubbing / cyanosis. No joint deformity upper and lower extremities. Good ROM, no contractures. Normal muscle tone.  Skin: no rashes, lesions, ulcers. No induration Neurologic: CN 2-12 grossly intact.Strength 5/5 in all 4.  Psychiatric: Normal judgment and insight. Alert and oriented x 3. Normal mood.   Labs on Admission: I have personally reviewed following labs and imaging studies  CBC: Recent Labs  Lab  01/22/19 1354  WBC 5.3  NEUTROABS 3.6  HGB 13.2  HCT 41.0  MCV 96.7  PLT 644*   Basic Metabolic Panel: Recent Labs  Lab 01/22/19 1354  NA 136  K 4.0  CL 102  CO2 23  GLUCOSE 142*  BUN 26*  CREATININE 1.83*  CALCIUM 8.9   Liver Function Tests: Recent Labs  Lab 01/22/19 1354  AST 31  ALT 17  ALKPHOS 38  BILITOT 0.3  PROT 6.6  ALBUMIN 3.7   Coagulation Profile: Recent Labs  Lab 01/22/19 1354  INR 0.9   Thyroid Function Tests: Recent Labs    01/22/19 1440  TSH 154.535*   Urine analysis:    Component Value Date/Time   COLORURINE  YELLOW 01/22/2019 [place] 01/22/2019 1317   LABSPEC 1.014 01/22/2019 1317   PHURINE 5.0 01/22/2019 1317   GLUCOSEU NEGATIVE 01/22/2019 1317   HGBUR MODERATE (A) 01/22/2019 [place] 01/22/2019 [place] 01/22/2019 1317   PROTEINUR 100 (A) 01/22/2019 1317   UROBILINOGEN 0.2 04/24/2014 1406   NITRITE NEGATIVE 01/22/2019 1317   LEUKOCYTESUR NEGATIVE 01/22/2019 1317    Radiological Exams on Admission: Dg Chest Port 1 View  Result Date: 01/22/2019 CLINICAL DATA:  Hypothermia.  History of diabetes and hypertension. EXAM: PORTABLE CHEST 1 VIEW COMPARISON:  Radiographs 08/23/2017.  CT 08/25/2017. FINDINGS: 1345 hours. Lordotic positioning. Allowing for this, the heart size and mediastinal contours are stable. The lungs are clear. There is no pleural effusion or pneumothorax. No acute osseous findings are evident. Telemetry leads overlie the chest. IMPRESSION: No evidence of active cardiopulmonary process. Electronically Signed   By: Richardean Sale M.D.   On: 01/22/2019 14:17    EKG: Independently reviewed.  Sinus bradycardia rate 45, QTC 485.  Low voltage diffusely, otherwise not significantly changed from prior..  Assessment/Plan Active Problems:   Hypothermia   Severe hypothyroidism -with severe hypothermia temperature 89.9 improved with Bair hugger's, bradycardia heart rate  40s.  Patient has not refilled her Synthroid 30 mcg daily, so has not taking her medication in at least 2 weeks.  TSH markedly elevated at 154.5.  Patient is alert and oriented, not in myxedema coma, with stable blood pressure, sodium 136.   WBC 5.3.  Normal lactic acid. -Continue IV Synthroid 100 mg daily for now as her TSH is markedly elevated, she is symptomatic and has not taken synthroid in 2 weeks. -Talked to pharmacy about subsequent dosing of Synthroid, continue at this dose for now, pharm will let us know tomorrow if dose needs to be re-adjusted, as when coverting from PO to IV, 1/2 dose of PO is used.  -Hold off on antibiotics at this time, presentation not suggestive of infectious etiology. -Follow-up blood and urine cultures - CBC, BMP a.m  Acute kidney injury- Cr 1.8, baseline 1.1.  Likely prerenal, with ongoing Lasix use 40 mg daily, and lisinopril.  1 L bolus given in ED. -Hold home Lasix and lisinopril - N/s 100cc/hr x 1 day. - BMP a.m  Respiratory acidosis-pH 7.2, PCO2 5, CO2 81.  Serum bicarb 23.  ??  Mild respiratory depression from severe hypothyroidism.  At this time patient is without respiratory complaints.  Awake and alert.  Portable chest x-ray without acute abnormality.  Hypertension-stable. - Resume home Norvasc 5mg  -Home labetalol with bradycardia -Hold lisinopril Lasix with AKI  Diabetes mellitus-random glucose 142. - SSI -Resume home Lantus at reduced dose 30 units nightly  Dementia-stable. -Resume home sertraline and donepezil   DVT prophylaxis: Lovenox Code Status: Full Family Communication: Spouse at bedside Disposition Plan: Per rounding team Consults called: None Admission status: Obs, stepdown   Bethena Roys MD [facility]  01/22/2019, 7:42 PM

## 2019-01-22 NOTE — ED Provider Notes (Signed)
[facility] EMERGENCY DEPARTMENT Provider Note   CSN: [id number] Arrival date & time: 01/22/19  1250     History   Chief Complaint Chief Complaint  Patient presents with  . Chills    HPI Courtney [name] is a 73 y.o. female.     HPI  This patient is an ill-appearing 73 year old female with a known history of diabetes and hypertension, according to care everywhere the patient is on Janumet, amlodipine, Lantus insulin, valsartan, Zocor, levothyroxine and furosemide.  There is no other notes in the chart and the patient has dementia thus a level 5 caveat applies.  It is unclear exactly who called 911 but the patient was found to have chills, she was complaining of being cold, weak and was found to be bradycardic, hypothermic but normotensive.  She is unable to tell me if there is any other complaints, she states she does not have chest pain or shortness of breath and has not been coughing.  She does not know who lives with her, she does not know very simple questions.  Past Medical History:  Diagnosis Date  . Cancer ([place])   . Diabetes ([place])   . Hypertension     Patient Active Problem List   Diagnosis Date Noted  . Osteoarthritis of left knee 01/23/2013    Past Surgical History:  Procedure Laterality Date  . ABDOMINAL HYSTERECTOMY    . COLONOSCOPY N/A 05/14/2015   Procedure: COLONOSCOPY;  Surgeon: Rogene Houston, MD;  Location: [facility] ENDO SUITE;  Service: Endoscopy;  Laterality: N/A;  730  . OOPHORECTOMY       OB History   No obstetric history on file.      Home Medications    Prior to Admission medications   Medication Sig Start Date End Date Taking? Authorizing Provider  acetaminophen-codeine (TYLENOL #3) 300-30 MG per tablet Take 1 tablet by mouth every 6 (six) hours as needed for pain. Patient not taking: Reported on 04/28/2017 03/14/13   Carole Civil, MD  allopurinol (ZYLOPRIM) 100 MG tablet Take 1 tablet by mouth daily. 09/10/18   [provider]   amLODipine (NORVASC) 10 MG tablet Take 5 mg by mouth daily.     [provider]  baclofen (LIORESAL) 20 MG tablet Take 20 mg by mouth at bedtime as needed for muscle spasms.    [provider]  cyanocobalamin 1000 MCG tablet Take 1,000 mcg by mouth daily.    [provider]  DiphenhydrAMINE HCl [facility] RELIEF EX) Apply 1 application topically daily as needed (itching).    [provider]  donepezil (ARICEPT) 10 MG tablet Take 10 mg by mouth daily. 03/20/17   [provider]  furosemide (LASIX) 20 MG tablet Take 20 mg by mouth.    [provider]  levothyroxine (SYNTHROID, LEVOTHROID) 125 MCG tablet Take 150 mcg by mouth daily before breakfast.     [provider]  Omega 3 1000 MG CAPS Take 1 capsule by mouth daily.    [provider]  QUEtiapine (SEROQUEL) 25 MG tablet Take 1 tablet (25 mg total) by mouth at bedtime. 10/27/17   Tomi Likens, Adam R, DO  QUEtiapine (SEROQUEL) 25 MG tablet Take 1 tablet (25 mg total) by mouth 2 (two) times daily. 09/14/18   Pieter Partridge, DO  sertraline (ZOLOFT) 100 MG tablet Take 100 mg by mouth daily. 04/20/17   [provider]  simvastatin (ZOCOR) 10 MG tablet Take 10 mg by mouth at bedtime.  [provider]  [place] 100-33 UNT-MCG/ML SOPN Inject 50 Units into the skin as directed. 03/20/17   [provider]  valsartan (DIOVAN) 320 MG tablet Take 320 mg by mouth daily.    [provider]    Family History Family History  Problem Relation Age of Onset  . Rheum arthritis Mother   . Heart failure Mother   . Hypertension Mother   . Stroke Father   . Diabetes Brother   . Diabetes Sister   . Hypertension Maternal Grandmother     Social History Social History   Tobacco Use  . Smoking status: Former Smoker    Packs/day: 0.50    Years: 10.00    Pack years: 5.00    Types: Cigarettes    Quit date: 05/22/2004    Years since quitting: 14.6  . Smokeless  tobacco: Never Used  Substance Use Topics  . Alcohol use: Yes    Alcohol/week: 0.0 standard drinks    Comment: social  . Drug use: No     Allergies   Patient has no known allergies.   Review of Systems Review of Systems  Unable to perform ROS: Dementia     Physical Exam Updated Vital Signs BP (!) 149/77   Pulse (!) 43   Temp (!) 92.7 F (33.7 C)   Resp 14   Ht 1.575 m (5\' 2" )   Wt 90.7 kg   SpO2 97%   BMI 36.58 kg/m   Physical Exam Constitutional:      Comments: Pale, ill-appearing  HENT:     Head:     Comments: Atraumatic, dry mucous membranes    Nose: Nose normal.     Mouth/Throat:     Mouth: Mucous membranes are dry.  Eyes:     Comments: Pupils are equal round and reactive, conjunctive are clear, no jaundice  Cardiovascular:     Comments: Bradycardic to 45, no murmur, pulses are strong at the radial arteries Pulmonary:     Comments: No increased work of breathing Abdominal:     Comments: Soft nontender abdomen  Musculoskeletal:     Comments: Bilateral mild edema, symmetrical  Skin:    Comments: Cold, pale  Neurological:     Comments: The patient is able to follow simple commands, she has cognitive deficits that prevent her from answering the day or her home address, she does know that she is in the hospital      ED Treatments / Results  Labs (all labs ordered are listed, but only abnormal results are displayed) Labs Reviewed  COMPREHENSIVE METABOLIC PANEL - Abnormal; Notable for the following components:      Result Value   Glucose, Bld 142 (*)    BUN 26 (*)    Creatinine, Ser 1.83 (*)    GFR calc non Af Amer 27 (*)    GFR calc Af Amer 31 (*)    All other components within normal limits  CBC WITH DIFFERENTIAL/PLATELET - Abnormal; Notable for the following components:   Platelets 140 (*)    All other components within normal limits  URINALYSIS, ROUTINE W REFLEX MICROSCOPIC - Abnormal; Notable for the following components:   Hgb urine  dipstick MODERATE (*)    Protein, ur 100 (*)    Bacteria, UA RARE (*)    All other components within normal limits  BLOOD GAS, ARTERIAL - Abnormal; Notable for the following components:   pH, Arterial 7.274 (*)    pCO2 arterial 50.3 (*)  pO2, Arterial 81.0 (*)    Acid-base deficit 3.2 (*)    All other components within normal limits  CULTURE, BLOOD (ROUTINE X 2)  CULTURE, BLOOD (ROUTINE X 2)  SARS CORONAVIRUS 2 (HOSPITAL ORDER, [place] [facility])  URINE CULTURE  LACTIC ACID, PLASMA  LACTIC ACID, PLASMA  APTT  PROTIME-INR  TSH  T4  T3  TROPONIN I (HIGH SENSITIVITY)  TROPONIN I (HIGH SENSITIVITY)    EKG EKG Interpretation  Date/Time:  Tuesday January 22 2019 13:05:17 EDT Ventricular Rate:  45 PR Interval:    QRS Duration: 96 QT Interval:  560 QTC Calculation: 485 R Axis:   -7 Text Interpretation:  Sinus bradycardia Probable anterior infarct, age indeterminate Since last tracing LOW VOLTAGE now presnt Since last tracing bradycardia now present Confirmed by Noemi Chapel [phone]) on 01/22/2019 2:31:14 PM   Radiology Dg Chest Port 1 View  Result Date: 01/22/2019 CLINICAL DATA:  Hypothermia.  History of diabetes and hypertension. EXAM: PORTABLE CHEST 1 VIEW COMPARISON:  Radiographs 08/23/2017.  CT 08/25/2017. FINDINGS: 1345 hours. Lordotic positioning. Allowing for this, the heart size and mediastinal contours are stable. The lungs are clear. There is no pleural effusion or pneumothorax. No acute osseous findings are evident. Telemetry leads overlie the chest. IMPRESSION: No evidence of active cardiopulmonary process. Electronically Signed   By: Richardean Sale M.D.   On: 01/22/2019 14:17    Procedures .Critical Care Performed by: Noemi Chapel, MD Authorized by: Noemi Chapel, MD   Critical care provider statement:    Critical care time (minutes):  35   Critical care was necessary to treat or prevent imminent or life-threatening deterioration of the  following conditions:  Endocrine crisis   Critical care was time spent personally by me on the following activities:  Discussions with consultants, evaluation of patient's response to treatment, examination of patient, ordering and performing treatments and interventions, ordering and review of laboratory studies, ordering and review of radiographic studies, pulse oximetry, re-evaluation of patient's condition, obtaining history from patient or surrogate and review of old charts   (including critical care time)  Medications Ordered in ED Medications  0.9 %  sodium chloride infusion (1,000 mLs Intravenous New Bag/Given 01/22/19 1342)  levothyroxine (SYNTHROID, LEVOTHROID) injection 100 mcg (has no administration in time range)  sodium chloride 0.9 % bolus 1,000 mL (has no administration in time range)     Initial Impression / Assessment and Plan / ED Course  I have reviewed the triage vital signs and the nursing notes.  Pertinent labs & imaging results that were available during my care of the patient were reviewed by me and considered in my medical decision making (see chart for details).       This patient is ill-appearing, she is severely hypothermic with a temperature of 89.9, a pulse of 43.  There is something going on which could be underlying sepsis, severe dehydration or other significant abnormalities, will start with labs, sepsis order set, lactic acid, aggressively hydrate and warm with a bear hugger.  Patient agreeable  The patient is critically ill with mild bradycardia which is improving as the temperature rewarms.  Blood pressures remain normal, labs have revealed that the patient does have an acute kidney injury.  The patient is also slightly confused, slightly bradycardic and the husband has now stated that the patient has not had her Synthroid in a while.  We will add on thyroid testing and admit to the hospitalist.  She is gradually rewarming.  IV  Synthroid ordered Bolus  of fluids ordered  ABG reveals a slight acidosis with a pH of 7.24 with a slight increase in CO2 and a normal bicarbonate.    Final Clinical Impressions(s) / ED Diagnoses   Final diagnoses:  Hypothermia, initial encounter  AKI (acute kidney injury) ([place])      Noemi Chapel, MD 01/22/19 [id number]

## 2019-01-22 NOTE — ED Triage Notes (Signed)
Called out to ems for pt being extremely cold and shivering.  Pt denies any pain.  Per ems hr was in the 40's and bp was 100 sys.  Given 373ml ns bolus by ems.  Pt is awake and alert. Skin cold to touch. Pt has mild dementia.  cbg 89

## 2019-01-22 NOTE — ED Notes (Signed)
bair hugger placed on pt. 

## 2019-01-23 DIAGNOSIS — I10 Essential (primary) hypertension: Secondary | ICD-10-CM | POA: Diagnosis present

## 2019-01-23 DIAGNOSIS — Z823 Family history of stroke: Secondary | ICD-10-CM | POA: Diagnosis not present

## 2019-01-23 DIAGNOSIS — E871 Hypo-osmolality and hyponatremia: Secondary | ICD-10-CM | POA: Diagnosis present

## 2019-01-23 DIAGNOSIS — Z8249 Family history of ischemic heart disease and other diseases of the circulatory system: Secondary | ICD-10-CM | POA: Diagnosis not present

## 2019-01-23 DIAGNOSIS — Z91138 Patient's unintentional underdosing of medication regimen for other reason: Secondary | ICD-10-CM | POA: Diagnosis not present

## 2019-01-23 DIAGNOSIS — R680 Hypothermia, not associated with low environmental temperature: Secondary | ICD-10-CM | POA: Diagnosis present

## 2019-01-23 DIAGNOSIS — N179 Acute kidney failure, unspecified: Secondary | ICD-10-CM | POA: Diagnosis present

## 2019-01-23 DIAGNOSIS — E872 Acidosis: Secondary | ICD-10-CM | POA: Diagnosis present

## 2019-01-23 DIAGNOSIS — R001 Bradycardia, unspecified: Secondary | ICD-10-CM | POA: Diagnosis present

## 2019-01-23 DIAGNOSIS — Z20828 Contact with and (suspected) exposure to other viral communicable diseases: Secondary | ICD-10-CM | POA: Diagnosis present

## 2019-01-23 DIAGNOSIS — T68XXXA Hypothermia, initial encounter: Secondary | ICD-10-CM | POA: Diagnosis not present

## 2019-01-23 DIAGNOSIS — E039 Hypothyroidism, unspecified: Secondary | ICD-10-CM | POA: Diagnosis present

## 2019-01-23 DIAGNOSIS — T381X6A Underdosing of thyroid hormones and substitutes, initial encounter: Secondary | ICD-10-CM | POA: Diagnosis present

## 2019-01-23 DIAGNOSIS — Z79899 Other long term (current) drug therapy: Secondary | ICD-10-CM | POA: Diagnosis not present

## 2019-01-23 DIAGNOSIS — E119 Type 2 diabetes mellitus without complications: Secondary | ICD-10-CM | POA: Diagnosis present

## 2019-01-23 DIAGNOSIS — Z90710 Acquired absence of both cervix and uterus: Secondary | ICD-10-CM | POA: Diagnosis not present

## 2019-01-23 DIAGNOSIS — F0390 Unspecified dementia without behavioral disturbance: Secondary | ICD-10-CM | POA: Diagnosis present

## 2019-01-23 DIAGNOSIS — Z79890 Hormone replacement therapy (postmenopausal): Secondary | ICD-10-CM | POA: Diagnosis not present

## 2019-01-23 DIAGNOSIS — Z794 Long term (current) use of insulin: Secondary | ICD-10-CM | POA: Diagnosis not present

## 2019-01-23 DIAGNOSIS — Z87891 Personal history of nicotine dependence: Secondary | ICD-10-CM | POA: Diagnosis not present

## 2019-01-23 DIAGNOSIS — Z8261 Family history of arthritis: Secondary | ICD-10-CM | POA: Diagnosis not present

## 2019-01-23 LAB — CBC
HCT: 41.7 % (ref 36.0–46.0)
Hemoglobin: 13.4 g/dL (ref 12.0–15.0)
MCH: 30.6 pg (ref 26.0–34.0)
MCHC: 32.1 g/dL (ref 30.0–36.0)
MCV: 95.2 fL (ref 80.0–100.0)
Platelets: 177 10*3/uL (ref 150–400)
RBC: 4.38 MIL/uL (ref 3.87–5.11)
RDW: 14.6 % (ref 11.5–15.5)
WBC: 7.4 10*3/uL (ref 4.0–10.5)
nRBC: 0 % (ref 0.0–0.2)

## 2019-01-23 LAB — BASIC METABOLIC PANEL
Anion gap: 6 (ref 5–15)
BUN: 23 mg/dL (ref 8–23)
CO2: 22 mmol/L (ref 22–32)
Calcium: 8.9 mg/dL (ref 8.9–10.3)
Chloride: 111 mmol/L (ref 98–111)
Creatinine, Ser: 1.66 mg/dL — ABNORMAL HIGH (ref 0.44–1.00)
GFR calc Af Amer: 35 mL/min — ABNORMAL LOW (ref >60)
GFR calc non Af Amer: 30 mL/min — ABNORMAL LOW (ref >60)
Glucose, Bld: 39 mg/dL — CL (ref 70–99)
Potassium: 3.7 mmol/L (ref 3.5–5.1)
Sodium: 139 mmol/L (ref 135–145)

## 2019-01-23 LAB — GLUCOSE, CAPILLARY
Glucose-Capillary: 37 mg/dL — CL (ref 70–99)
Glucose-Capillary: 49 mg/dL — ABNORMAL LOW (ref 70–99)
Glucose-Capillary: 123 mg/dL — ABNORMAL HIGH (ref 70–99)
Glucose-Capillary: 104 mg/dL — ABNORMAL HIGH (ref 70–99)
Glucose-Capillary: 181 mg/dL — ABNORMAL HIGH (ref 70–99)
Glucose-Capillary: 145 mg/dL — ABNORMAL HIGH (ref 70–99)
Glucose-Capillary: 151 mg/dL — ABNORMAL HIGH (ref 70–99)

## 2019-01-23 LAB — T3: T3, Total: <20 ng/dL — ABNORMAL LOW (ref 71–180)

## 2019-01-23 LAB — URINE CULTURE: Culture: NO GROWTH

## 2019-01-23 LAB — T4: T4, Total: <0.4 ug/dL — ABNORMAL LOW (ref 4.5–12.0)

## 2019-01-23 MED ORDER — AMLODIPINE BESYLATE 5 MG PO TABS
2.5000 mg | ORAL_TABLET | Freq: Every day | ORAL | Status: DC
  Administered 2019-01-24: 2.5 mg via ORAL
  Filled 2019-01-23: qty 1

## 2019-01-23 MED ORDER — DEXTROSE 50 % IV SOLN
INTRAVENOUS | Status: AC
  Filled 2019-01-23: qty 50

## 2019-01-23 MED ORDER — DEXTROSE-NACL 5-0.45 % IV SOLN
INTRAVENOUS | Status: DC
  Administered 2019-01-23 – 2019-01-24 (×4): via INTRAVENOUS

## 2019-01-23 MED ORDER — TAMSULOSIN HCL 0.4 MG PO CAPS
0.4000 mg | ORAL_CAPSULE | Freq: Every day | ORAL | Status: DC
  Administered 2019-01-23 – 2019-01-24 (×2): 0.4 mg via ORAL
  Filled 2019-01-23 (×2): qty 1

## 2019-01-23 MED ORDER — INSULIN GLARGINE 100 UNIT/ML ~~LOC~~ SOLN
26.0000 [IU] | Freq: Every day | SUBCUTANEOUS | Status: DC
  Administered 2019-01-23: 26 [IU] via SUBCUTANEOUS
  Filled 2019-01-23 (×2): qty 0.26

## 2019-01-23 MED ORDER — DEXTROSE 50 % IV SOLN
25.0000 mL | Freq: Once | INTRAVENOUS | Status: AC
  Administered 2019-01-23: 25 mL via INTRAVENOUS

## 2019-01-23 MED ORDER — LORAZEPAM 2 MG/ML IJ SOLN
1.0000 mg | Freq: Once | INTRAMUSCULAR | Status: AC
  Administered 2019-01-23: 1 mg via INTRAVENOUS
  Filled 2019-01-23: qty 1

## 2019-01-23 NOTE — Progress Notes (Signed)
Inpatient Diabetes Program Recommendations  AACE/ADA: New Consensus Statement on Inpatient Glycemic Control  Target Ranges:  Prepandial:   less than 140 mg/dL      Peak postprandial:   less than 180 mg/dL (1-2 hours)      Critically ill patients:  140 - 180 mg/dL   Results for Courtney [name], Courtney [name] (MRN [id number]) as of 01/23/2019 11:49  Ref. Range 01/23/2019 06:08 01/23/2019 06:24 01/23/2019 06:47 01/23/2019 07:55 01/23/2019 11:41  Glucose-Capillary Latest Ref Range: 70 - 99 mg/dL 37 (LL) 49 (L) 123 (H) 104 (H) 181 (H)  Results for Courtney [name], Courtney [name] (MRN [id number]) as of 01/23/2019 11:49  Ref. Range 01/22/2019 21:18  Glucose-Capillary Latest Ref Range: 70 - 99 mg/dL 114 (H)  Lantus 30 units    Review of Glycemic Control  Diabetes history: DM2 Outpatient Diabetes medications: Lantus 60 units QHS Current orders for Inpatient glycemic control: Lantus 26 units QHS, Novolog 0-9 units TID with meals  Inpatient Diabetes Program Recommendations:   Insulin-Basal: Noted Lantus was decreased from 30 to 26 units QHS due to fasting glucose of 37 mg/dl today. If patient experiences any other issues with hypoglycemia, please consider decreasing Lantus further to 20 units QHS.  Thanks, Barnie Alderman, RN, MSN, CDE Diabetes Coordinator Inpatient Diabetes Program [phone] (Team Pager from 8am to 5pm)

## 2019-01-23 NOTE — Progress Notes (Signed)
Patient Demographics:    Courtney [name], is a 73 y.o. female, DOB - [birth year], NTI:[id number]  Admit date - 01/22/2019   Admitting Physician Ejiroghene Arlyce Dice, MD  Outpatient Primary MD for the patient is Ginger Organ  LOS - 0   Chief Complaint  Patient presents with   Chills        Subjective:    Courtney [name] today has no fevers, no emesis,  No chest pain, husband at bedside,   hypothermia improving with warming blanket,  Bradycardia also improving temperature improves -Patient feels cold and hot, no further diaphoresis  Assessment  & Plan :    Principal Problem:   Hypothermia Active Problems:   Hypothyroidism   HTN (hypertension)  Brief Summary 73 y.o. female with medical history significant for HTN, DM2,  hypothyroidism admitted on 01/22/2019 with hypothermia and bradycardia after missed doses of levothyroxine  A/p 1)Severe Hypothyroidism--due to missed doses of levothyroxine for over 2 weeks, TSH above 154, free T4 is less than 0.4, T3 is less than 20--PTA patient was supposed to be on levothyroxine 150 mcg daily -Patient with symptomatic hypothyroidism leading to persistent hypothermia and bradycardia, as well as worsening cognitive and memory issues --Okay to continue IV levothyroxine at 100 mcg daily -Continue warming blanket  2)Hypothermia--- secondary to #1 above, managed as above #1 -Improving with warming blanket  3)Bradycardia--secondary to #2 above, improving with improvement in body temperature  4)DM2--no recent A1c available, patient had episodes of hypoglycemia this a.m. requiring IV dextrose infusion-due to poor oral intake, will decrease Lantus insulin to 26 units q. at bedtime, PTA patient was on Lantus 60 units nightly Allow some permissive Hyperglycemia rather than risk life-threatening hypoglycemia in a patient with unreliable oral intake. Use Novolog/Humalog  Sliding scale insulin with Accu-Cheks/Fingersticks as ordered  5)HTN--stable, continue to hold amlodipine 5 mg daily, continue to hold labetalol 400 mg twice daily, continue to hold lisinopril 5 mg daily and continue to hold Lasix 40 mg daily  6)Dementia----patient is more forgetful now with worsening hypothyroidism, continue Aricept 10 mg qhs and Zoloft 100 mg every morning  Disposition/Need for in-Hospital Stay- patient unable to be discharged at this time due to Patient with symptomatic hypothyroidism leading to persistent hypothermia and bradycardia, as well as worsening cognitive and memory issues--- continue IV levothyroxine, continue warming blanket  Code Status : full  Family Communication:   (patient is alert, awake and coherent) Discussed with husband at bedside  Disposition Plan  : Remain in stepdown unit due to severe hypothermia and bradycardia   Consults  :  NA  DVT Prophylaxis  :  Lovenox -   - SCDs   Lab Results  Component Value Date   PLT 177 01/23/2019    Inpatient Medications  Scheduled Meds:  allopurinol  100 mg Oral Daily   [START ON 01/24/2019] amLODipine  2.5 mg Oral Daily   Chlorhexidine Gluconate Cloth  6 each Topical Daily   dextrose       donepezil  10 mg Oral QHS   enoxaparin (LOVENOX) injection  50 mg Subcutaneous Q24H   insulin aspart  0-9 Units Subcutaneous TID WC   insulin glargine  26 Units Subcutaneous QHS   levothyroxine  100 mcg Intravenous Daily  sertraline  100 mg Oral q morning - 10a   tamsulosin  0.4 mg Oral Daily   Continuous Infusions:  dextrose 5 % and 0.45% NaCl 125 mL/hr at 01/23/19 0905   PRN Meds:.acetaminophen **OR** acetaminophen, ondansetron **OR** ondansetron (ZOFRAN) IV, polyethylene glycol   Anti-infectives (From admission, onward)   None       Objective:   Vitals:   01/23/19 0943 01/23/19 0954 01/23/19 1000 01/23/19 1001  BP:   (!) 140/93   Pulse: 62  61 62  Resp: 12 13 16 16   Temp: 97.7 F (36.5  C) 97.7 F (36.5 C) 97.7 F (36.5 C) 97.7 F (36.5 C)  TempSrc:      SpO2: 95% 94% 95% 99%  Weight:      Height:        Wt Readings from Last 3 Encounters:  01/23/19 103.4 kg  04/28/17 102.1 kg  05/14/15 102.1 kg    Intake/Output Summary (Last 24 hours) at 01/23/2019 1026 Last data filed at 01/23/2019 0524 Gross per 24 hour  Intake 2962.53 ml  Output 550 ml  Net 2412.53 ml   Physical Exam Gen:- Awake Alert,  In no apparent distress  HEENT:- [place].AT, No sclera icterus Neck-Supple Neck,No JVD,.  Lungs-  CTAB , fair symmetrical air movement CV- S1, S2 normal, regular  Abd-  +ve B.Sounds, Abd Soft, No tenderness,    Extremity/Skin:- No  Edema (No Myxedema type skin changes), pedal pulses present  Psych-  cognitive and memory deficits persist neuro-generalized weakness/deconditioning, no new focal deficits, no tremors   Data Review:   Micro Results Recent Results (from the past 240 hour(s))  SARS Coronavirus 2 [facility] order, Performed in [facility] hospital lab) Nasopharyngeal Urine, Catheterized     Status: None   Collection Time: 01/22/19  1:18 PM   Specimen: Urine, Catheterized; Nasopharyngeal  Result Value Ref Range Status   SARS Coronavirus 2 NEGATIVE NEGATIVE Final    Comment: (NOTE) If result is NEGATIVE SARS-CoV-2 target nucleic acids are NOT DETECTED. The SARS-CoV-2 RNA is generally detectable in upper and lower  respiratory specimens during the acute phase of infection. The lowest  concentration of SARS-CoV-2 viral copies this assay can detect is 250  copies / mL. A negative result does not preclude SARS-CoV-2 infection  and should not be used as the sole basis for treatment or other  patient management decisions.  A negative result may occur with  improper specimen collection / handling, submission of specimen other  than nasopharyngeal swab, presence of viral mutation(s) within the  areas targeted by this assay, and inadequate number of viral copies  (<250  copies / mL). A negative result must be combined with clinical  observations, patient history, and epidemiological information. If result is POSITIVE SARS-CoV-2 target nucleic acids are DETECTED. The SARS-CoV-2 RNA is generally detectable in upper and lower  respiratory specimens dur ing the acute phase of infection.  Positive  results are indicative of active infection with SARS-CoV-2.  Clinical  correlation with patient history and other diagnostic information is  necessary to determine patient infection status.  Positive results do  not rule out bacterial infection or co-infection with other viruses. If result is PRESUMPTIVE POSTIVE SARS-CoV-2 nucleic acids MAY BE PRESENT.   A presumptive positive result was obtained on the submitted specimen  and confirmed on repeat testing.  While 2019 novel coronavirus  (SARS-CoV-2) nucleic acids may be present in the submitted sample  additional confirmatory testing may be necessary for epidemiological  and /  or clinical management purposes  to differentiate between  SARS-CoV-2 and other Sarbecovirus currently known to infect humans.  If clinically indicated additional testing with an alternate test  methodology [phone]) is advised. The SARS-CoV-2 RNA is generally  detectable in upper and lower respiratory sp ecimens during the acute  phase of infection. The expected result is Negative. Fact Sheet for Patients:  StrictlyIdeas.no Fact Sheet for Healthcare Providers: BankingDealers.co.za This test is not yet approved or cleared by the [place] FDA and has been authorized for detection and/or diagnosis of SARS-CoV-2 by FDA under an Emergency Use Authorization (EUA).  This EUA will remain in effect (meaning this test can be used) for the duration of the COVID-19 declaration under Section 564(b)(1) of the Act, 21 U.S.C. section 360bbb-3(b)(1), unless the authorization is terminated or revoked  sooner. Performed at [facility], [address], [place], [place] [address]   Blood Culture (routine x 2)     Status: None (Preliminary result)   Collection Time: 01/22/19  1:55 PM   Specimen: BLOOD  Result Value Ref Range Status   Specimen Description BLOOD RIGHT ANTECUBITAL  Final   Special Requests   Final    BOTTLES DRAWN AEROBIC AND ANAEROBIC Blood Culture adequate volume Performed at [facility], [address] [place], [place] [address]    Culture PENDING  Incomplete   Report Status PENDING  Incomplete  Blood Culture (routine x 2)     Status: None (Preliminary result)   Collection Time: 01/22/19  2:40 PM   Specimen: BLOOD  Result Value Ref Range Status   Specimen Description BLOOD BLOOD RIGHT ARM  Final   Special Requests   Final    BOTTLES DRAWN AEROBIC AND ANAEROBIC Blood Culture adequate volume Performed at [facility], [address], [place], [place] [address]    Culture PENDING  Incomplete   Report Status PENDING  Incomplete  MRSA PCR Screening     Status: None   Collection Time: 01/22/19  5:57 PM   Specimen: Nasal Mucosa; Nasopharyngeal  Result Value Ref Range Status   MRSA by PCR NEGATIVE NEGATIVE Final    Comment:        The GeneXpert MRSA Assay (FDA approved for NASAL specimens only), is one component of a comprehensive MRSA colonization surveillance program. It is not intended to diagnose MRSA infection nor to guide or monitor treatment for MRSA infections. Performed at [facility], [address] [place], [place] [address]    Radiology Reports Dg Chest [place] 1 View  Result Date: 01/22/2019 CLINICAL DATA:  Hypothermia.  History of diabetes and hypertension. EXAM: PORTABLE CHEST 1 VIEW COMPARISON:  Radiographs 08/23/2017.  CT 08/25/2017. FINDINGS: 1345 hours. Lordotic positioning. Allowing for this, the heart size and mediastinal contours are stable. The lungs are clear. There is no pleural effusion or pneumothorax. No acute osseous findings are  evident. Telemetry leads overlie the chest. IMPRESSION: No evidence of active cardiopulmonary process. Electronically Signed   By: Richardean Sale M.D.   On: 01/22/2019 14:17    CBC Recent Labs  Lab 01/22/19 1354 01/23/19 0529  WBC 5.3 7.4  HGB 13.2 13.4  HCT 41.0 41.7  PLT 140* 177  MCV 96.7 95.2  MCH 31.1 30.6  MCHC 32.2 32.1  RDW 14.4 14.6  LYMPHSABS 1.4  --   MONOABS 0.2  --   EOSABS 0.1  --   BASOSABS 0.1  --     Chemistries  Recent Labs  Lab 01/22/19 1354 01/23/19 0529  NA  136 139  K 4.0 3.7  CL 102 111  CO2 23 22  GLUCOSE 142* 39*  BUN 26* 23  CREATININE 1.83* 1.66*  CALCIUM 8.9 8.9  AST 31  --   ALT 17  --   ALKPHOS 38  --   BILITOT 0.3  --    ------------------------------------------------------------------------------------------------------------------ No results for input(s): CHOL, HDL, LDLCALC, TRIG, CHOLHDL, LDLDIRECT in the last 72 hours.  No results found for: HGBA1C ------------------------------------------------------------------------------------------------------------------ Recent Labs    01/22/19 1354 01/22/19 1440  TSH  --  154.535*  T4TOTAL <0.4*  --    ------------------------------------------------------------------------------------------------------------------ No results for input(s): VITAMINB12, FOLATE, FERRITIN, TIBC, IRON, RETICCTPCT in the last 72 hours.  Coagulation profile Recent Labs  Lab 01/22/19 1354  INR 0.9    No results for input(s): DDIMER in the last 72 hours.  Cardiac Enzymes No results for input(s): CKMB, TROPONINI, MYOGLOBIN in the last 168 hours.  Invalid input(s): CK ------------------------------------------------------------------------------------------------------------------ No results found for: BNP   Roxan Hockey M.D on 01/23/2019 at 10:26 AM  Go to [email or web address] - for contact info  [facility] - Office  [phone]

## 2019-01-23 NOTE — Progress Notes (Signed)
Warming blanket turned off and taken off patient at 11am this morning. Patient's temp via temp foley remained stable and above 98 degress Farenheit without warming blanket. Temp foley dc'd without complications. Total urine output via foley today was 319ml. Dr. Joesph Fillers made aware of temp, foley discontinued (@1815 ) and output.

## 2019-01-24 LAB — COMPREHENSIVE METABOLIC PANEL
ALT: 17 U/L (ref 0–44)
AST: 36 U/L (ref 15–41)
Albumin: 3.2 g/dL — ABNORMAL LOW (ref 3.5–5.0)
Alkaline Phosphatase: 37 U/L — ABNORMAL LOW (ref 38–126)
Anion gap: 6 (ref 5–15)
BUN: 19 mg/dL (ref 8–23)
CO2: 23 mmol/L (ref 22–32)
Calcium: 8.5 mg/dL — ABNORMAL LOW (ref 8.9–10.3)
Chloride: 107 mmol/L (ref 98–111)
Creatinine, Ser: 1.48 mg/dL — ABNORMAL HIGH (ref 0.44–1.00)
GFR calc Af Amer: 40 mL/min — ABNORMAL LOW (ref >60)
GFR calc non Af Amer: 35 mL/min — ABNORMAL LOW (ref >60)
Glucose, Bld: 109 mg/dL — ABNORMAL HIGH (ref 70–99)
Potassium: 3.7 mmol/L (ref 3.5–5.1)
Sodium: 136 mmol/L (ref 135–145)
Total Bilirubin: 0.3 mg/dL (ref 0.3–1.2)
Total Protein: 6.1 g/dL — ABNORMAL LOW (ref 6.5–8.1)

## 2019-01-24 LAB — GLUCOSE, CAPILLARY
Glucose-Capillary: 96 mg/dL (ref 70–99)
Glucose-Capillary: 83 mg/dL (ref 70–99)

## 2019-01-24 MED ORDER — ACETAMINOPHEN 325 MG PO TABS: 650.0000 mg | ORAL_TABLET | Freq: Four times a day (QID) | ORAL | 0 refills | Status: AC | PRN

## 2019-01-24 MED ORDER — LANTUS SOLOSTAR 100 UNIT/ML ~~LOC~~ SOPN: 40.0000 [IU] | PEN_INJECTOR | Freq: Every day | SUBCUTANEOUS | 11 refills | Status: DC

## 2019-01-24 MED ORDER — LEVOTHYROXINE SODIUM 175 MCG PO TABS: 175.0000 ug | ORAL_TABLET | Freq: Every day | ORAL | 1 refills | Status: DC

## 2019-01-24 NOTE — Progress Notes (Signed)
Patient is to be discharged home and in stable condition. Patient's husband given discharge instructions and verbalized understanding. Patient's IV removed, WNL. Patient escorted out by staff via wheelchair.  Celestia Khat, RN

## 2019-01-24 NOTE — Discharge Summary (Signed)
Courtney [name], is a 73 y.o. female  DOB Mar 06, 1946  MRN [id number].  Admission date:  01/22/2019  Admitting Physician  Bethena Roys, MD  Discharge Date:  01/24/2019   Primary MD  Cory Munch, PA-C  Recommendations for primary care physician for things to follow:   1)Take Levothyroxine 175 mcg daily as prescribed--- take it on empty stomach, wait at least 2 hours after taking levothyroxine before taking other medications 2)Repeat TSH/thyroid test in 1 to 2 weeks  3) follow-up with your primary care physician for recheck and reevaluation within 7 days 4) check your blood sugars at least before each meal and bedtime--your Lantus insulin has been reduced to 40 units at bedtime from 60 units for now due to concerns about potential for low blood sugar--- your primary care physician can further adjust your insulin dosing regimen  Admission Diagnosis  AKI (acute kidney injury) ([place]) [N17.9] Hypothermia, initial encounter [T68.XXXA]   Discharge Diagnosis  AKI (acute kidney injury) ([place]) [N17.9] Hypothermia, initial encounter [T68.XXXA]    Principal Problem:   Hypothermia Active Problems:   Hypothyroidism   HTN (hypertension)      Past Medical History:  Diagnosis Date   Cancer ([place])    Diabetes ([place])    Hypertension     Past Surgical History:  Procedure Laterality Date   ABDOMINAL HYSTERECTOMY     COLONOSCOPY N/A 05/14/2015   Procedure: COLONOSCOPY;  Surgeon: Rogene Houston, MD;  Location: [facility];  Service: Endoscopy;  Laterality: N/A;  5   OOPHORECTOMY       HPI  from the history and physical done on the day of admission:    -Chief Complaint: Low temperature  HPI: Courtney [name] is a 73 y.o. female with medical history significant for hypertension, diabetes mellitus, hypothyroidism.  Patient was brought to the ED with complaints of feeling cold and shivering.  Patient  spouse is at bedside and helps with the history.  Patient has some baseline dementia especially with short-term memory, but she is independent with most of her ADLs.  Patient is able to answer a most questions appropriately.  Per spouse low temperature has been ongoing and progressing over the past 2 weeks.  At baseline she does not tolerate cold.  This morning, patient was drenched in sweat. But patient spouse reports that even with home temperature at 76, while he is sweating patient states she is very cold.  No alcohol use.  She has not been exposed to cold temperatures.  Patient has not taking her Synthroid in at least 2 weeks as she has not been able to refill her prescription. Patient denies fever , reports a mild headache last night, without neck pain or stiffness, no difficulty breathing or cough, no pain with urination, no vomiting or loose stools, no mildly of feeling ill.  Apart from her low temperature she is otherwise without complaints.  Ental status is at baseline.  ED Course: Temperature 89.9, heart rate initially 43, blood pressure systolic 638/46.  Vitals  improved with rewarming with bair hugger, temperature now 96.1, heart rate 54, blood pressure 169/92.  WBC 5.3.  ABG showed respiratory acidosis pH 7.2 PCO2 50.  Creatinine elevated 1.8.  Sodium 136.  EKG showed sinus bradycardia rate 45 with low voltage.  UA rare bacteria.  For acute abnormality.  TSH at the time ordered and pending.  1 L bolus normal saline given.  Hospitalist to admit for hyponatremia likely secondary to hypothyroidism.    Hospital Course:     -Brief Summary 73 y.o.femalewith medical history significant forHTN, DM2, hypothyroidism admitted on 01/22/2019 with hypothermia and bradycardia after missed doses of levothyroxine  A/p 1)Severe Hypothyroidism--due to missed doses of levothyroxine for over 2 weeks, TSH above 154, free T4 is less than 0.4, T3 is less than 20--PTA patient was supposed to be on levothyroxine  150 mcg daily -Patient with symptomatic hypothyroidism leading to persistent hypothermia and bradycardia, as well as worsening cognitive and memory issues --Treated with IV levothyroxine at 100 mcg daily and warming blanket -Hypothermia and bradycardia have resolved -Patient is hemodynamically stable  2)Hypothermia--- secondary to #1 above, managed as above #1 -Resolved, temperature remains normal without warming blanket  3)Bradycardia--secondary to #2 above, -Resolved  4)DM2--continue insulin regimen follow-up with PCP as advised  5)HTN--stable, discharged home on labetalol, lisinopril and amlodipine  6)Dementia----patient is more forgetful now with worsening hypothyroidism, continue Aricept 10 mg qhs and Zoloft 100 mg every morning  Disposition/Need for in-Hospital Stay- patient unable to be discharged at this time due to Patient with symptomatic hypothyroidism leading to persistent hypothermia and bradycardia, as well as worsening cognitive and memory issues--- continue IV levothyroxine, continue warming blanket  Code Status : full  Family Communication:   (patient is alert, awake and coherent) Discussed with husband at bedside  Disposition Plan  : Home with husband   Discharge Condition: Stable with resolved hypothermia and resolved bradycardia  Follow UP--- PCP as advised   Diet and Activity recommendation:  As advised  Discharge Instructions    Discharge Instructions    Call MD for:  difficulty breathing, headache or visual disturbances   Complete by: As directed    Call MD for:  persistant dizziness or light-headedness   Complete by: As directed    Call MD for:  persistant nausea and vomiting   Complete by: As directed    Call MD for:  severe uncontrolled pain   Complete by: As directed    Call MD for:  temperature >100.4   Complete by: As directed    Diet - low sodium heart healthy   Complete by: As directed    Discharge instructions   Complete by: As  directed    1)Take Levothyroxine 175 mcg daily as prescribed--- take it on empty stomach, wait at least 2 hours after taking levothyroxine before taking other medications 2)Repeat TSH/thyroid test in 1 to 2 weeks  3) follow-up with your primary care physician for recheck and reevaluation within 7 days 4) check your blood sugars at least before each meal and bedtime--your Lantus insulin has been reduced to 40 units at bedtime from 60 units for now due to concerns about potential for low blood sugar--- your primary care physician can further adjust your insulin dosing regimen   Increase activity slowly   Complete by: As directed        Discharge Medications     Allergies as of 01/24/2019   No Known Allergies     Medication List    TAKE these medications  acetaminophen 325 MG tablet Commonly known as: TYLENOL Take 2 tablets (650 mg total) by mouth every 6 (six) hours as needed for mild pain, fever or headache (or Fever >/= 101).   allopurinol 100 MG tablet Commonly known as: ZYLOPRIM Take 1 tablet by mouth daily.   amLODipine 5 MG tablet Commonly known as: NORVASC Take 5 mg by mouth daily.   donepezil 10 MG tablet Commonly known as: ARICEPT Take 10 mg by mouth at bedtime.   ferrous sulfate 325 (65 FE) MG tablet Take 325 mg by mouth daily with breakfast.   furosemide 20 MG tablet Commonly known as: LASIX Take 40 mg by mouth every morning.   labetalol 200 MG tablet Commonly known as: NORMODYNE Take 400 mg by mouth 2 (two) times daily.   Lantus SoloStar 100 UNIT/ML Solostar Pen Generic drug: Insulin Glargine Inject 40 Units into the skin at bedtime. What changed: how much to take   levothyroxine 175 MCG tablet Commonly known as: SYNTHROID Take 1 tablet (175 mcg total) by mouth daily before breakfast. What changed:   medication strength  how much to take   lisinopril 5 MG tablet Commonly known as: ZESTRIL Take 5 mg by mouth daily.   Omega 3 1000 MG Caps Take  1 capsule by mouth daily.   sertraline 100 MG tablet Commonly known as: ZOLOFT Take 100 mg by mouth every morning.      Major procedures and Radiology Reports - PLEASE review detailed and final reports for all details, in brief -    Dg Chest Port 1 View  Result Date: 01/22/2019 CLINICAL DATA:  Hypothermia.  History of diabetes and hypertension. EXAM: PORTABLE CHEST 1 VIEW COMPARISON:  Radiographs 08/23/2017.  CT 08/25/2017. FINDINGS: 1345 hours. Lordotic positioning. Allowing for this, the heart size and mediastinal contours are stable. The lungs are clear. There is no pleural effusion or pneumothorax. No acute osseous findings are evident. Telemetry leads overlie the chest. IMPRESSION: No evidence of active cardiopulmonary process. Electronically Signed   By: Richardean Sale M.D.   On: 01/22/2019 14:17    Micro Results   Recent Results (from the past 240 hour(s))  Urine culture     Status: None   Collection Time: 01/22/19  1:17 PM   Specimen: In/Out Cath Urine  Result Value Ref Range Status   Specimen Description   Final    IN/OUT CATH URINE Performed at [facility], [address] [place], [place] [address]    Special Requests   Final    NONE Performed at [facility], [address], [place], [place] [address]    Culture   Final    NO GROWTH Performed at [facility], [place] [address] [place],  [address]    Report Status 01/23/2019 FINAL  Final  SARS Coronavirus 2 [facility] order, Performed in [facility] hospital lab) Nasopharyngeal Urine, Catheterized     Status: None   Collection Time: 01/22/19  1:18 PM   Specimen: Urine, Catheterized; Nasopharyngeal  Result Value Ref Range Status   SARS Coronavirus 2 NEGATIVE NEGATIVE Final    Comment: (NOTE) If result is NEGATIVE SARS-CoV-2 target nucleic acids are NOT DETECTED. The SARS-CoV-2 RNA is generally detectable in upper and lower  respiratory specimens during the acute phase of infection. The lowest    concentration of SARS-CoV-2 viral copies this assay can detect is 250  copies / mL. A negative result does not preclude SARS-CoV-2 infection  and should not be used as the sole  basis for treatment or other  patient management decisions.  A negative result may occur with  improper specimen collection / handling, submission of specimen other  than nasopharyngeal swab, presence of viral mutation(s) within the  areas targeted by this assay, and inadequate number of viral copies  (<250 copies / mL). A negative result must be combined with clinical  observations, patient history, and epidemiological information. If result is POSITIVE SARS-CoV-2 target nucleic acids are DETECTED. The SARS-CoV-2 RNA is generally detectable in upper and lower  respiratory specimens dur ing the acute phase of infection.  Positive  results are indicative of active infection with SARS-CoV-2.  Clinical  correlation with patient history and other diagnostic information is  necessary to determine patient infection status.  Positive results do  not rule out bacterial infection or co-infection with other viruses. If result is PRESUMPTIVE POSTIVE SARS-CoV-2 nucleic acids MAY BE PRESENT.   A presumptive positive result was obtained on the submitted specimen  and confirmed on repeat testing.  While 2019 novel coronavirus  (SARS-CoV-2) nucleic acids may be present in the submitted sample  additional confirmatory testing may be necessary for epidemiological  and / or clinical management purposes  to differentiate between  SARS-CoV-2 and other Sarbecovirus currently known to infect humans.  If clinically indicated additional testing with an alternate test  methodology [phone]) is advised. The SARS-CoV-2 RNA is generally  detectable in upper and lower respiratory sp ecimens during the acute  phase of infection. The expected result is Negative. Fact Sheet for Patients:  StrictlyIdeas.no Fact Sheet  for Healthcare Providers: BankingDealers.co.za This test is not yet approved or cleared by the [place] FDA and has been authorized for detection and/or diagnosis of SARS-CoV-2 by FDA under an Emergency Use Authorization (EUA).  This EUA will remain in effect (meaning this test can be used) for the duration of the COVID-19 declaration under Section 564(b)(1) of the Act, 21 U.S.C. section 360bbb-3(b)(1), unless the authorization is terminated or revoked sooner. Performed at [facility], [address] [place], [place] [address]   Blood Culture (routine x 2)     Status: None (Preliminary result)   Collection Time: 01/22/19  1:55 PM   Specimen: BLOOD  Result Value Ref Range Status   Specimen Description BLOOD RIGHT ANTECUBITAL  Final   Special Requests   Final    BOTTLES DRAWN AEROBIC AND ANAEROBIC Blood Culture adequate volume   Culture   Final    NO GROWTH 2 DAYS Performed at [facility], [address], [place], [place] [address]    Report Status PENDING  Incomplete  Blood Culture (routine x 2)     Status: None (Preliminary result)   Collection Time: 01/22/19  2:40 PM   Specimen: BLOOD  Result Value Ref Range Status   Specimen Description BLOOD BLOOD RIGHT ARM  Final   Special Requests   Final    BOTTLES DRAWN AEROBIC AND ANAEROBIC Blood Culture adequate volume   Culture   Final    NO GROWTH 2 DAYS Performed at [facility], [address] [place], [place] [address]    Report Status PENDING  Incomplete  MRSA PCR Screening     Status: None   Collection Time: 01/22/19  5:57 PM   Specimen: Nasal Mucosa; Nasopharyngeal  Result Value Ref Range Status   MRSA by PCR NEGATIVE NEGATIVE Final    Comment:        The GeneXpert MRSA Assay (FDA approved for NASAL specimens only), is one component  of a comprehensive MRSA colonization surveillance program. It is not intended to diagnose MRSA infection nor to guide or monitor treatment for MRSA  infections. Performed at [facility], [address], [place], [place] [address]    Today   Subjective    Courtney [name] today has no new complaints, --Patient's husband states that patient is back to baseline -Requesting discharge home   Patient has been seen and examined prior to discharge   Objective   Blood pressure (!) 163/97, pulse 64, temperature 97.6 F (36.4 C), temperature source Oral, resp. rate 18, height 5\' 1"  (1.549 m), weight 105.5 kg, SpO2 97 %.   Intake/Output Summary (Last 24 hours) at 01/24/2019 1236 Last data filed at 01/24/2019 0708 Gross per 24 hour  Intake 2727.54 ml  Output 700 ml  Net 2027.54 ml   Exam Gen:- Awake Alert,  In no apparent distress  HEENT:- [place].AT, No sclera icterus Neck-Supple Neck,No JVD,.  Lungs-  CTAB , fair symmetrical air movement CV- S1, S2 normal, regular  Abd-  +ve B.Sounds, Abd Soft, No tenderness,    Extremity/Skin:- No  Edema (No Myxedema type skin changes), pedal pulses present  Psych-  cognitive and memory deficits persist neuro-generalized weakness/deconditioning, no new focal deficits, no tremors   Data Review   CBC w Diff:  Lab Results  Component Value Date   WBC 7.4 01/23/2019   HGB 13.4 01/23/2019   HCT 41.7 01/23/2019   PLT 177 01/23/2019   LYMPHOPCT 27 01/22/2019   MONOPCT 3 01/22/2019   EOSPCT 1 01/22/2019   BASOPCT 1 01/22/2019   CMP:  Lab Results  Component Value Date   NA 136 01/24/2019   K 3.7 01/24/2019   CL 107 01/24/2019   CO2 23 01/24/2019   BUN 19 01/24/2019   CREATININE 1.48 (H) 01/24/2019   PROT 6.1 (L) 01/24/2019   ALBUMIN 3.2 (L) 01/24/2019   BILITOT 0.3 01/24/2019   ALKPHOS 37 (L) 01/24/2019   AST 36 01/24/2019   ALT 17 01/24/2019  . Total Discharge time is about 33 minutes  Roxan Hockey M.D on 01/24/2019 at 12:36 PM  Go to [email or web address] -  for contact info  [facility] - Office  [phone]

## 2019-01-24 NOTE — Progress Notes (Signed)
Home Health choices:  [facility] [phone]  [place] my Favorites Quality of Patient Care Rating 4 out of 5 stars Patient Survey Summary Rating 4 out of [place] [phone]  [place] my Favorites Quality of Patient Care Rating 3 out of 5 stars Patient Survey Summary Rating 5 out of [place] [phone]  [place] my Favorites Quality of Patient Care Rating 3 out of 5 stars Patient Survey Summary Rating 4 out of [place] [phone]  [place] my Favorites Quality of Patient Care Rating 3  out of 5 stars Patient Survey Summary Rating 4 out of 5 stars [place] [phone]) [phone]  Add [facility] my Favorites Quality of Patient Care Rating 4  out of 5 stars Patient Survey Summary Rating 3 out of 5 stars [facility] [phone]  [facility] my Favorites Quality of Patient Care Rating 4 out of 5 stars Patient Survey Summary Rating 4 out of 5 stars [place] [phone]  Add [facility] my Favorites Quality of Patient Care Rating 4 out of 5 stars Patient Survey Summary Rating 4 out of 5 stars [place] [phone]  [place] my Favorites Quality of Patient Care Rating 4 out of 5 stars Patient Survey Summary Rating 4 out of 5 stars [place] AGE [phone]  [place] my Favorites Quality of Patient Care Rating 3 out of 5 stars Patient Survey Summary Rating 3 out of 5 stars [facility] [place] [phone]  Add [facility] [place] my Favorites Quality of Patient Care Rating 3  out of 5 stars Patient Survey Summary Rating 4 out of 5 stars [place] [phone]  [place] my Favorites Quality of Patient Care Rating 3 out of 5 stars Patient Survey Summary Rating 4 out of 5 stars [facility] [phone]  Add [facility] my Favorites Quality of Patient Care Rating 3  out of 5 stars Patient Survey Summary Rating 3 out of 5 stars [place] [phone]  Add [facility] my Favorites Quality of Patient Care Rating 3  out of 5 stars [place] [phone]  [place] my Favorites Quality of Patient Care Rating 4  out of 5 stars Patient Survey Summary Rating

## 2019-01-24 NOTE — TOC Initial Note (Signed)
Transition of Care [facility]) - Initial/Assessment Note    Patient Details  Name: Courtney [name] MRN: [id number] Date of Birth: June 26, 1945  Transition of Care [facility] CM/SW Contact:    Boneta Lucks, RN Phone Number: 01/24/2019, 1:59 PM  Clinical Narrative:      Patient discharging home, husband is driving her home. PT recommended Home Health PT, Husband has no preferences.  Called [place] with [facility], he accepted the referral for HHPT.                  Expected Discharge Plan and [place] for HHPT       Expected Discharge Date: 01/24/19                    Activities of Daily Living Home Assistive Devices/Equipment: Eyeglasses ADL Screening (condition at time of admission) Patient's cognitive ability adequate to safely complete daily activities?: Yes Is the patient deaf or have difficulty hearing?: No Does the patient have difficulty seeing, even when wearing glasses/contacts?: No Does the patient have difficulty concentrating, remembering, or making decisions?: No Patient able to express need for assistance with ADLs?: Yes Does the patient have difficulty dressing or bathing?: No Independently performs ADLs?: Yes (appropriate for developmental age) Does the patient have difficulty walking or climbing stairs?: No Weakness of Legs: None Weakness of Arms/Hands: None  Admission diagnosis:  AKI (acute kidney injury) ([place]) [N17.9] Hypothermia, initial encounter [T68.XXXA] Patient Active Problem List   Diagnosis Date Noted  . Hypothyroidism 01/23/2019  . HTN (hypertension) 01/23/2019  . Hypothermia 01/22/2019  . Osteoarthritis of left knee 01/23/2013   PCP:  Cory Munch, PA-C Pharmacy:   [facility], [place] [address] Phone: [phone] Fax: [phone]     Social Determinants of Health (SDOH) Interventions    Readmission Risk Interventions No flowsheet data found.

## 2019-01-24 NOTE — Discharge Instructions (Signed)
1)Take Levothyroxine 175 mcg daily as prescribed--- take it on empty stomach, wait at least 2 hours after taking levothyroxine before taking other medications 2)Repeat TSH/thyroid test in 1 to 2 weeks  3) follow-up with your primary care physician for recheck and reevaluation within 7 days 4) check your blood sugars at least before each meal and bedtime--your Lantus insulin has been reduced to 40 units at bedtime from 60 units for now due to concerns about potential for low blood sugar--- your primary care physician can further adjust your insulin dosing regimen

## 2019-01-24 NOTE — Evaluation (Signed)
Physical Therapy Evaluation Patient Details Name: Courtney [name] MRN: [id number] DOB: [birth year] Today's Date: 01/24/2019   History of Present Illness  Courtney [name] is a 73 y.o. female with medical history significant for hypertension, diabetes mellitus, hypothyroidism.  Patient was brought to the ED with complaints of feeling cold and shivering.  Patient spouse is at bedside and helps with the history.  Patient has some baseline dementia especially with short-term memory, but she is independent with most of her ADLs.  Patient is able to answer a most questions appropriately.  Per spouse low temperature has been ongoing and progressing over the past 2 weeks.  At baseline she does not tolerate cold.  This morning, patient was drenched in sweat. But patient spouse reports that even with home temperature at 76, while he is sweating patient states she is very cold.  No alcohol use.  She has not been exposed to cold temperatures.  Patient has not taking her Synthroid in at least 2 weeks as she has not been able to refill her prescription.    Clinical Impression  Patient's spouse present and helpful during treatment.  Patient demonstrates slow labored movement for supine to sitting without assistance, required hands on for sit to stands, transfers and taking steps in room/hallway without loss of balance, unsteady on feet without loss of balance and limited due to fatigue.  Patient's spouse states he feels comfortable taking care of her.  Patient tolerated sitting up in chair with her spouse present after therapy.  Patient to be discharged home to day and discharged from physical therapy to care of nursing for ambulation daily as tolerated for length of stay.    Follow Up Recommendations Home health PT;Supervision for mobility/OOB;Supervision - Intermittent    Equipment Recommendations  None recommended by PT    Recommendations for Other Services       Precautions / Restrictions  Precautions Precautions: Fall Restrictions Weight Bearing Restrictions: No      Mobility  Bed Mobility Overal bed mobility: Needs Assistance Bed Mobility: Supine to Sit     Supine to sit: Supervision     General bed mobility comments: slow labored movement  Transfers Overall transfer level: Needs assistance Equipment used: Rolling walker (2 wheeled) Transfers: Sit to/from [facility] Sit to Stand: Min guard Stand pivot transfers: Min assist       General transfer comment: labored movement, increased time  Ambulation/Gait Ambulation/Gait assistance: Min assist Gait Distance (Feet): 40 Feet Assistive device: Rolling walker (2 wheeled) Gait Pattern/deviations: Decreased step length - right;Decreased step length - left;Decreased stride length Gait velocity: decreased   General Gait Details: slow labored cadence with frequent verbal cues to step closer to RW with fair/good carryover, no loss of balance, limited secondary to fatigue, on room air with SpO2 at 95%  Stairs            Wheelchair Mobility    Modified Rankin (Stroke Patients Only)       Balance Overall balance assessment: Needs assistance Sitting-balance support: Feet supported;No upper extremity supported Sitting balance-Leahy Scale: Fair Sitting balance - Comments: fair/good seated at bedside   Standing balance support: During functional activity;Bilateral upper extremity supported Standing balance-Leahy Scale: Fair Standing balance comment: using RW                             Pertinent Vitals/Pain Pain Assessment: No/denies pain    Home Living Family/patient expects to be discharged to::  Private residence   Available Help at Discharge: Family;Available 24 hours/day Type of Home: House Home Access: Stairs to enter Entrance Stairs-Rails: Right;Left;Can reach both Entrance Stairs-Number of Steps: 2 Home Layout: One level Home Equipment: Walker - 2  wheels;Wheelchair - manual;Cane - single point;Shower seat      Prior Function Level of Independence: Needs assistance   Gait / Transfers Assistance Needed: Household and short distanced community ambulator with RW PRN  ADL's / Homemaking Assistance Needed: assisted by spouse        Hand Dominance        Extremity/Trunk Assessment   Upper Extremity Assessment Upper Extremity Assessment: Generalized weakness    Lower Extremity Assessment Lower Extremity Assessment: Generalized weakness    Cervical / Trunk Assessment Cervical / Trunk Assessment: Kyphotic  Communication   Communication: No difficulties  Cognition Arousal/Alertness: Awake/alert Behavior During Therapy: WFL for tasks assessed/performed Overall Cognitive Status: History of cognitive impairments - at baseline                                 General Comments: Patient very forgetful per spouse      General Comments      Exercises     Assessment/Plan    PT Assessment All further PT needs can be met in the next venue of care  PT Problem List Decreased strength;Decreased activity tolerance;Decreased balance;Decreased mobility       PT Treatment Interventions      PT Goals (Current goals can be found in the Care Plan section)  Acute Rehab PT Goals Patient Stated Goal: return home with spouse to assist PT Goal Formulation: With patient/family Time For Goal Achievement: 01/24/19 Potential to Achieve Goals: Good    Frequency     Barriers to discharge        Co-evaluation               AM-PAC PT "6 Clicks" Mobility  Outcome Measure Help needed turning from your back to your side while in a flat bed without using bedrails?: None Help needed moving from lying on your back to sitting on the side of a flat bed without using bedrails?: None Help needed moving to and from a bed to a chair (including a wheelchair)?: A Little Help needed standing up from a chair using your arms  (e.g., wheelchair or bedside chair)?: A Little Help needed to walk in hospital room?: A Lot Help needed climbing 3-5 steps with a railing? : A Lot 6 Click Score: 18    End of Session Equipment Utilized During Treatment: Gait belt Activity Tolerance: Patient tolerated treatment well;Patient limited by fatigue Patient left: in chair;with call bell/phone within reach;with family/visitor present Nurse Communication: Mobility status PT Visit Diagnosis: Unsteadiness on feet (R26.81);Other abnormalities of gait and mobility (R26.89);Muscle weakness (generalized) (M62.81)    Time: 9643-8381 PT Time Calculation (min) (ACUTE ONLY): 29 min   Charges:   PT Evaluation $PT Eval Moderate Complexity: 1 Mod PT Treatments $Therapeutic Activity: 23-37 mins        2:16 PM, 01/24/19 Lonell Grandchild, MPT Physical Therapist with [facility] 336 [phone] office [phone] mobile phone

## 2019-01-27 DIAGNOSIS — F0280 Dementia in other diseases classified elsewhere without behavioral disturbance: Secondary | ICD-10-CM | POA: Diagnosis not present

## 2019-01-27 DIAGNOSIS — Z794 Long term (current) use of insulin: Secondary | ICD-10-CM | POA: Diagnosis not present

## 2019-01-27 DIAGNOSIS — Z90711 Acquired absence of uterus with remaining cervical stump: Secondary | ICD-10-CM | POA: Diagnosis not present

## 2019-01-27 DIAGNOSIS — Z90721 Acquired absence of ovaries, unilateral: Secondary | ICD-10-CM | POA: Diagnosis not present

## 2019-01-27 DIAGNOSIS — I10 Essential (primary) hypertension: Secondary | ICD-10-CM | POA: Diagnosis not present

## 2019-01-27 DIAGNOSIS — E871 Hypo-osmolality and hyponatremia: Secondary | ICD-10-CM | POA: Diagnosis not present

## 2019-01-27 DIAGNOSIS — E119 Type 2 diabetes mellitus without complications: Secondary | ICD-10-CM | POA: Diagnosis not present

## 2019-01-27 DIAGNOSIS — E872 Acidosis: Secondary | ICD-10-CM | POA: Diagnosis not present

## 2019-01-27 DIAGNOSIS — Z87891 Personal history of nicotine dependence: Secondary | ICD-10-CM | POA: Diagnosis not present

## 2019-01-27 DIAGNOSIS — R7989 Other specified abnormal findings of blood chemistry: Secondary | ICD-10-CM | POA: Diagnosis not present

## 2019-01-27 DIAGNOSIS — R001 Bradycardia, unspecified: Secondary | ICD-10-CM | POA: Diagnosis not present

## 2019-01-27 DIAGNOSIS — M25562 Pain in left knee: Secondary | ICD-10-CM | POA: Diagnosis not present

## 2019-01-27 DIAGNOSIS — N179 Acute kidney failure, unspecified: Secondary | ICD-10-CM | POA: Diagnosis not present

## 2019-01-27 DIAGNOSIS — M545 Low back pain: Secondary | ICD-10-CM | POA: Diagnosis not present

## 2019-01-27 DIAGNOSIS — Z9181 History of falling: Secondary | ICD-10-CM | POA: Diagnosis not present

## 2019-01-27 DIAGNOSIS — E039 Hypothyroidism, unspecified: Secondary | ICD-10-CM | POA: Diagnosis not present

## 2019-01-27 LAB — CULTURE, BLOOD (ROUTINE X 2)
Culture: NO GROWTH
Culture: NO GROWTH
Special Requests: ADEQUATE
Special Requests: ADEQUATE

## 2019-02-01 DIAGNOSIS — N183 Chronic kidney disease, stage 3 (moderate): Secondary | ICD-10-CM | POA: Diagnosis not present

## 2019-02-01 DIAGNOSIS — Z6841 Body Mass Index (BMI) 40.0 and over, adult: Secondary | ICD-10-CM | POA: Diagnosis not present

## 2019-02-01 DIAGNOSIS — E063 Autoimmune thyroiditis: Secondary | ICD-10-CM | POA: Diagnosis not present

## 2019-02-01 DIAGNOSIS — E7849 Other hyperlipidemia: Secondary | ICD-10-CM | POA: Diagnosis not present

## 2019-02-01 DIAGNOSIS — E1129 Type 2 diabetes mellitus with other diabetic kidney complication: Secondary | ICD-10-CM | POA: Diagnosis not present

## 2019-02-01 DIAGNOSIS — F0390 Unspecified dementia without behavioral disturbance: Secondary | ICD-10-CM | POA: Diagnosis not present

## 2019-02-01 DIAGNOSIS — I10 Essential (primary) hypertension: Secondary | ICD-10-CM | POA: Diagnosis not present

## 2019-02-01 DIAGNOSIS — Z23 Encounter for immunization: Secondary | ICD-10-CM | POA: Diagnosis not present

## 2019-02-07 ENCOUNTER — Other Ambulatory Visit (HOSPITAL_COMMUNITY): Payer: Self-pay | Admitting: Family Medicine

## 2019-02-07 DIAGNOSIS — Z1231 Encounter for screening mammogram for malignant neoplasm of breast: Secondary | ICD-10-CM

## 2019-02-08 DIAGNOSIS — Z794 Long term (current) use of insulin: Secondary | ICD-10-CM | POA: Diagnosis not present

## 2019-02-08 DIAGNOSIS — Z87891 Personal history of nicotine dependence: Secondary | ICD-10-CM | POA: Diagnosis not present

## 2019-02-08 DIAGNOSIS — R7989 Other specified abnormal findings of blood chemistry: Secondary | ICD-10-CM | POA: Diagnosis not present

## 2019-02-08 DIAGNOSIS — Z90711 Acquired absence of uterus with remaining cervical stump: Secondary | ICD-10-CM | POA: Diagnosis not present

## 2019-02-08 DIAGNOSIS — Z9181 History of falling: Secondary | ICD-10-CM | POA: Diagnosis not present

## 2019-02-08 DIAGNOSIS — E872 Acidosis: Secondary | ICD-10-CM | POA: Diagnosis not present

## 2019-02-08 DIAGNOSIS — F0280 Dementia in other diseases classified elsewhere without behavioral disturbance: Secondary | ICD-10-CM | POA: Diagnosis not present

## 2019-02-08 DIAGNOSIS — N179 Acute kidney failure, unspecified: Secondary | ICD-10-CM | POA: Diagnosis not present

## 2019-02-08 DIAGNOSIS — E119 Type 2 diabetes mellitus without complications: Secondary | ICD-10-CM | POA: Diagnosis not present

## 2019-02-08 DIAGNOSIS — M545 Low back pain: Secondary | ICD-10-CM | POA: Diagnosis not present

## 2019-02-08 DIAGNOSIS — E871 Hypo-osmolality and hyponatremia: Secondary | ICD-10-CM | POA: Diagnosis not present

## 2019-02-08 DIAGNOSIS — E039 Hypothyroidism, unspecified: Secondary | ICD-10-CM | POA: Diagnosis not present

## 2019-02-08 DIAGNOSIS — I10 Essential (primary) hypertension: Secondary | ICD-10-CM | POA: Diagnosis not present

## 2019-02-08 DIAGNOSIS — Z90721 Acquired absence of ovaries, unilateral: Secondary | ICD-10-CM | POA: Diagnosis not present

## 2019-02-08 DIAGNOSIS — M25562 Pain in left knee: Secondary | ICD-10-CM | POA: Diagnosis not present

## 2019-02-08 DIAGNOSIS — R001 Bradycardia, unspecified: Secondary | ICD-10-CM | POA: Diagnosis not present

## 2019-02-21 DIAGNOSIS — N183 Chronic kidney disease, stage 3 unspecified: Secondary | ICD-10-CM | POA: Diagnosis not present

## 2019-02-21 DIAGNOSIS — Z23 Encounter for immunization: Secondary | ICD-10-CM | POA: Diagnosis not present

## 2019-02-21 DIAGNOSIS — E1129 Type 2 diabetes mellitus with other diabetic kidney complication: Secondary | ICD-10-CM | POA: Diagnosis not present

## 2019-04-02 ENCOUNTER — Ambulatory Visit: Payer: PPO | Admitting: Internal Medicine

## 2019-04-26 ENCOUNTER — Other Ambulatory Visit: Payer: Self-pay

## 2019-04-30 ENCOUNTER — Encounter: Payer: PPO | Admitting: Internal Medicine

## 2019-04-30 NOTE — Progress Notes (Deleted)
Name: Courtney [name]  MRN/ DOB: [id number], 11/24/1945   Age/ Sex: 73 y.o., female    PCP: [facility]   Reason for Endocrinology Evaluation: Type 2 Diabetes Mellitus     Date of Initial Endocrinology Visit: 04/30/2019     PATIENT IDENTIFIER: Courtney [name] is a 73 y.o. female with a past medical history of HTN, T2DM, Hypothyroidism and dementia. The patient presented for initial endocrinology clinic visit on 04/30/2019 for consultative assistance with her diabetes management.    HPI: Courtney [name] was    Diagnosed with DM *** Prior Medications tried/Intolerance: *** Currently checking blood sugars *** x / day,  before breakfast and ***.  Hypoglycemia episodes : ***               Symptoms: ***                 Frequency: ***/  Hemoglobin A1c has ranged from *** in ***, peaking at *** in ***. Patient required assistance for hypoglycemia:  Patient has required hospitalization within the last 1 year from hyper or hypoglycemia:   In terms of diet, the patient ***   HOME DIABETES REGIMEN: Lantus    Statin: {Yes/No:11203} ACE-I/ARB: {YES/NO:17245} Prior Diabetic Education: {Yes/No:11203}   METER DOWNLOAD SUMMARY: Date range evaluated: *** Fingerstick Blood Glucose Tests = *** Average Number Tests/Day = *** Overall Mean FS Glucose = *** Standard Deviation = ***  BG Ranges: Low = *** High = ***   Hypoglycemic Events/30 Days: BG < 50 = *** Episodes of symptomatic severe hypoglycemia = ***   DIABETIC COMPLICATIONS: Microvascular complications:   CKD III,   Denies: ***  Last eye exam: Completed   Macrovascular complications:   ***  Denies: CAD, PVD, CVA   PAST HISTORY: Past Medical History:  Past Medical History:  Diagnosis Date  . Cancer ([place])   . Diabetes ([place])   . Hypertension     Past Surgical History:  Past Surgical History:  Procedure Laterality Date  . ABDOMINAL HYSTERECTOMY    . COLONOSCOPY N/A 05/14/2015   Procedure:  COLONOSCOPY;  Surgeon: Rogene Houston, MD;  Location: [facility];  Service: Endoscopy;  Laterality: N/A;  730  . OOPHORECTOMY        Social History:  reports that she quit smoking about 14 years ago. Her smoking use included cigarettes. She has a 5.00 pack-year smoking history. She has never used smokeless tobacco. She reports current alcohol use. She reports that she does not use drugs. Family History:  Family History  Problem Relation Age of Onset  . Rheum arthritis Mother   . Heart failure Mother   . Hypertension Mother   . Stroke Father   . Diabetes Brother   . Diabetes Sister   . Hypertension Maternal Grandmother       HOME MEDICATIONS: Allergies as of 04/30/2019   No Known Allergies     Medication List       Accurate as of April 30, 2019  7:24 AM. If you have any questions, ask your nurse or doctor.        acetaminophen 325 MG tablet Commonly known as: TYLENOL Take 2 tablets (650 mg total) by mouth every 6 (six) hours as needed for mild pain, fever or headache (or Fever >/= 101).   allopurinol 100 MG tablet Commonly known as: ZYLOPRIM Take 1 tablet by mouth daily.   amLODipine 5 MG tablet Commonly known as: NORVASC Take 5 mg by mouth  daily.   donepezil 10 MG tablet Commonly known as: ARICEPT Take 10 mg by mouth at bedtime.   ferrous sulfate 325 (65 FE) MG tablet Take 325 mg by mouth daily with breakfast.   furosemide 20 MG tablet Commonly known as: LASIX Take 40 mg by mouth every morning.   labetalol 200 MG tablet Commonly known as: NORMODYNE Take 400 mg by mouth 2 (two) times daily.   Lantus SoloStar 100 UNIT/ML Solostar Pen Generic drug: Insulin Glargine Inject 40 Units into the skin at bedtime.   levothyroxine 175 MCG tablet Commonly known as: SYNTHROID Take 1 tablet (175 mcg total) by mouth daily before breakfast.   lisinopril 5 MG tablet Commonly known as: ZESTRIL Take 5 mg by mouth daily.   Omega 3 1000 MG Caps Take 1 capsule by  mouth daily.   QUEtiapine 25 MG tablet Commonly known as: SEROQUEL   sertraline 100 MG tablet Commonly known as: ZOLOFT Take 100 mg by mouth every morning.        ALLERGIES: No Known Allergies   REVIEW OF SYSTEMS: A comprehensive ROS was conducted with the patient and is negative except as per HPI and below:  ROS    OBJECTIVE:   VITAL SIGNS: There were no vitals taken for this visit.   PHYSICAL EXAM:  General: Pt appears well and is in NAD  Hydration: Well-hydrated with moist mucous membranes and good skin turgor  HEENT: Head: Unremarkable with good dentition. Oropharynx clear without exudate.  Eyes: External eye exam normal without stare, lid lag or exophthalmos.  EOM intact.  PERRL.  Neck: General: Supple without adenopathy or carotid bruits. Thyroid: Thyroid size normal.  No goiter or nodules appreciated. No thyroid bruit.  Lungs: Clear with good BS bilat with no rales, rhonchi, or wheezes  Heart: RRR with normal S1 and S2 and no gallops; no murmurs; no rub  Abdomen: Normoactive bowel sounds, soft, nontender, without masses or organomegaly palpable  Extremities:  Lower extremities - No pretibial edema. No lesions.  Skin: Normal texture and temperature to palpation. No rash noted. No Acanthosis nigricans/skin tags. No lipohypertrophy.  Neuro: MS is good with appropriate affect, pt is alert and Ox3    DM foot exam:    DATA REVIEWED:  No results found for: HGBA1C Lab Results  Component Value Date   CREATININE 1.48 (H) 01/24/2019      ASSESSMENT / PLAN / RECOMMENDATIONS:   1) Type *** Diabetes Mellitus, ***controlled, With*** complications - Most recent A1c of *** %. Goal A1c < *** %.  ***  Plan: GENERAL:  ***  MEDICATIONS:  ***  EDUCATION / INSTRUCTIONS:  BG monitoring instructions: Patient is instructed to check her blood sugars *** times a day, ***.  Call [facility] if: BG persistently < 70 or > 300. . I reviewed the Rule of  15 for the treatment of hypoglycemia in detail with the patient. Literature supplied.   2) Diabetic complications:   Eye: Does *** have known diabetic retinopathy.   Neuro/ Feet: Does *** have known diabetic peripheral neuropathy.  Renal: Patient does *** have known baseline CKD. She is *** on an ACEI/ARB at present.Check urine albumin/creatinine ratio yearly starting at time of diagnosis. If albuminuria is positive, treatment is geared toward better glucose, blood pressure control and use of ACE inhibitors or ARBs. Monitor electrolytes and creatinine once to twice yearly.   3) Lipids: Patient is *** on a statin.    4) Hypertension: ***  at goal of <  140/90 mmHg.       Signed electronically by: Mack Guise, MD  [facility] Endocrin[facility]  [facility] [address] [place],  [address] Phone: [phone] FAX: [phone]   CC: [facility] [address] [place] [address] Phone: [phone]  Fax: [phone]    Return to Endocrinology clinic as below: Future Appointments  Date Time Provider [place]  04/30/2019  9:30 AM Khamia Stambaugh, Melanie Crazier, MD [facility] None

## 2019-05-03 DIAGNOSIS — Z6837 Body mass index (BMI) 37.0-37.9, adult: Secondary | ICD-10-CM | POA: Diagnosis not present

## 2019-05-03 DIAGNOSIS — E039 Hypothyroidism, unspecified: Secondary | ICD-10-CM | POA: Diagnosis not present

## 2019-05-03 DIAGNOSIS — E1129 Type 2 diabetes mellitus with other diabetic kidney complication: Secondary | ICD-10-CM | POA: Diagnosis not present

## 2019-05-03 DIAGNOSIS — F0390 Unspecified dementia without behavioral disturbance: Secondary | ICD-10-CM | POA: Diagnosis not present

## 2019-05-03 DIAGNOSIS — M1711 Unilateral primary osteoarthritis, right knee: Secondary | ICD-10-CM | POA: Diagnosis not present

## 2019-05-03 DIAGNOSIS — I10 Essential (primary) hypertension: Secondary | ICD-10-CM | POA: Diagnosis not present

## 2019-05-03 DIAGNOSIS — N183 Chronic kidney disease, stage 3 unspecified: Secondary | ICD-10-CM | POA: Diagnosis not present

## 2019-05-03 DIAGNOSIS — Z0001 Encounter for general adult medical examination with abnormal findings: Secondary | ICD-10-CM | POA: Diagnosis not present

## 2019-05-03 DIAGNOSIS — Z1389 Encounter for screening for other disorder: Secondary | ICD-10-CM | POA: Diagnosis not present

## 2019-05-03 DIAGNOSIS — F329 Major depressive disorder, single episode, unspecified: Secondary | ICD-10-CM | POA: Diagnosis not present

## 2019-05-20 ENCOUNTER — Ambulatory Visit: Payer: PPO | Admitting: Internal Medicine

## 2019-07-25 DIAGNOSIS — E7849 Other hyperlipidemia: Secondary | ICD-10-CM | POA: Diagnosis not present

## 2019-07-25 DIAGNOSIS — E039 Hypothyroidism, unspecified: Secondary | ICD-10-CM | POA: Diagnosis not present

## 2019-07-25 DIAGNOSIS — N183 Chronic kidney disease, stage 3 unspecified: Secondary | ICD-10-CM | POA: Diagnosis not present

## 2019-07-25 DIAGNOSIS — R269 Unspecified abnormalities of gait and mobility: Secondary | ICD-10-CM | POA: Diagnosis not present

## 2019-07-25 DIAGNOSIS — M1711 Unilateral primary osteoarthritis, right knee: Secondary | ICD-10-CM | POA: Diagnosis not present

## 2019-07-25 DIAGNOSIS — I10 Essential (primary) hypertension: Secondary | ICD-10-CM | POA: Diagnosis not present

## 2019-07-25 DIAGNOSIS — Z6838 Body mass index (BMI) 38.0-38.9, adult: Secondary | ICD-10-CM | POA: Diagnosis not present

## 2019-07-25 DIAGNOSIS — E1129 Type 2 diabetes mellitus with other diabetic kidney complication: Secondary | ICD-10-CM | POA: Diagnosis not present

## 2019-07-25 DIAGNOSIS — F329 Major depressive disorder, single episode, unspecified: Secondary | ICD-10-CM | POA: Diagnosis not present

## 2019-08-20 ENCOUNTER — Observation Stay (HOSPITAL_COMMUNITY)
Admission: EM | Admit: 2019-08-20 | Discharge: 2019-08-21 | Disposition: A | Discharge status: Home / Self Care | Payer: PPO | Attending: Internal Medicine | Admitting: Internal Medicine

## 2019-08-20 ENCOUNTER — Observation Stay (HOSPITAL_COMMUNITY): Payer: PPO

## 2019-08-20 ENCOUNTER — Emergency Department (HOSPITAL_COMMUNITY): Payer: PPO

## 2019-08-20 ENCOUNTER — Encounter (HOSPITAL_COMMUNITY): Payer: Self-pay | Admitting: *Deleted

## 2019-08-20 ENCOUNTER — Telehealth: Payer: Self-pay | Admitting: Neurology

## 2019-08-20 ENCOUNTER — Other Ambulatory Visit: Payer: Self-pay

## 2019-08-20 DIAGNOSIS — R2689 Other abnormalities of gait and mobility: Secondary | ICD-10-CM | POA: Insufficient documentation

## 2019-08-20 DIAGNOSIS — G459 Transient cerebral ischemic attack, unspecified: Secondary | ICD-10-CM

## 2019-08-20 DIAGNOSIS — Z20822 Contact with and (suspected) exposure to covid-19: Secondary | ICD-10-CM | POA: Insufficient documentation

## 2019-08-20 DIAGNOSIS — I6381 Other cerebral infarction due to occlusion or stenosis of small artery: Principal | ICD-10-CM | POA: Insufficient documentation

## 2019-08-20 DIAGNOSIS — M6281 Muscle weakness (generalized): Secondary | ICD-10-CM | POA: Diagnosis not present

## 2019-08-20 DIAGNOSIS — Z794 Long term (current) use of insulin: Secondary | ICD-10-CM | POA: Diagnosis present

## 2019-08-20 DIAGNOSIS — E785 Hyperlipidemia, unspecified: Secondary | ICD-10-CM | POA: Insufficient documentation

## 2019-08-20 DIAGNOSIS — R2681 Unsteadiness on feet: Secondary | ICD-10-CM | POA: Insufficient documentation

## 2019-08-20 DIAGNOSIS — Z7982 Long term (current) use of aspirin: Secondary | ICD-10-CM | POA: Insufficient documentation

## 2019-08-20 DIAGNOSIS — S0990XA Unspecified injury of head, initial encounter: Secondary | ICD-10-CM | POA: Diagnosis not present

## 2019-08-20 DIAGNOSIS — M542 Cervicalgia: Secondary | ICD-10-CM | POA: Diagnosis not present

## 2019-08-20 DIAGNOSIS — E039 Hypothyroidism, unspecified: Secondary | ICD-10-CM | POA: Diagnosis not present

## 2019-08-20 DIAGNOSIS — Z9181 History of falling: Secondary | ICD-10-CM | POA: Diagnosis not present

## 2019-08-20 DIAGNOSIS — E1169 Type 2 diabetes mellitus with other specified complication: Secondary | ICD-10-CM | POA: Diagnosis present

## 2019-08-20 DIAGNOSIS — Z79899 Other long term (current) drug therapy: Secondary | ICD-10-CM | POA: Diagnosis not present

## 2019-08-20 DIAGNOSIS — Z87891 Personal history of nicotine dependence: Secondary | ICD-10-CM | POA: Diagnosis not present

## 2019-08-20 DIAGNOSIS — R296 Repeated falls: Secondary | ICD-10-CM | POA: Diagnosis not present

## 2019-08-20 DIAGNOSIS — E1129 Type 2 diabetes mellitus with other diabetic kidney complication: Secondary | ICD-10-CM | POA: Diagnosis not present

## 2019-08-20 DIAGNOSIS — I639 Cerebral infarction, unspecified: Secondary | ICD-10-CM | POA: Diagnosis present

## 2019-08-20 DIAGNOSIS — I10 Essential (primary) hypertension: Secondary | ICD-10-CM | POA: Diagnosis not present

## 2019-08-20 DIAGNOSIS — E1149 Type 2 diabetes mellitus with other diabetic neurological complication: Secondary | ICD-10-CM | POA: Diagnosis present

## 2019-08-20 DIAGNOSIS — Z23 Encounter for immunization: Secondary | ICD-10-CM | POA: Insufficient documentation

## 2019-08-20 DIAGNOSIS — S199XXA Unspecified injury of neck, initial encounter: Secondary | ICD-10-CM | POA: Diagnosis not present

## 2019-08-20 DIAGNOSIS — E1165 Type 2 diabetes mellitus with hyperglycemia: Secondary | ICD-10-CM | POA: Diagnosis not present

## 2019-08-20 DIAGNOSIS — R531 Weakness: Secondary | ICD-10-CM | POA: Diagnosis not present

## 2019-08-20 DIAGNOSIS — E119 Type 2 diabetes mellitus without complications: Secondary | ICD-10-CM

## 2019-08-20 LAB — COMPREHENSIVE METABOLIC PANEL
ALT: 14 U/L (ref 0–44)
AST: 14 U/L — ABNORMAL LOW (ref 15–41)
Albumin: 3.6 g/dL (ref 3.5–5.0)
Alkaline Phosphatase: 50 U/L (ref 38–126)
Anion gap: 9 (ref 5–15)
BUN: 31 mg/dL — ABNORMAL HIGH (ref 8–23)
CO2: 25 mmol/L (ref 22–32)
Calcium: 9 mg/dL (ref 8.9–10.3)
Chloride: 102 mmol/L (ref 98–111)
Creatinine, Ser: 1.77 mg/dL — ABNORMAL HIGH (ref 0.44–1.00)
GFR calc Af Amer: 32 mL/min — ABNORMAL LOW (ref >60)
GFR calc non Af Amer: 28 mL/min — ABNORMAL LOW (ref >60)
Glucose, Bld: 197 mg/dL — ABNORMAL HIGH (ref 70–99)
Potassium: 4.5 mmol/L (ref 3.5–5.1)
Sodium: 136 mmol/L (ref 135–145)
Total Bilirubin: 0.4 mg/dL (ref 0.3–1.2)
Total Protein: 6.5 g/dL (ref 6.5–8.1)

## 2019-08-20 LAB — PROTIME-INR
INR: 0.9 (ref 0.8–1.2)
Prothrombin Time: 12.5 seconds (ref 11.4–15.2)

## 2019-08-20 LAB — DIFFERENTIAL
Abs Immature Granulocytes: 0.03 10*3/uL (ref 0.00–0.07)
Basophils Absolute: 0.1 10*3/uL (ref 0.0–0.1)
Basophils Relative: 1 %
Eosinophils Absolute: 0.3 10*3/uL (ref 0.0–0.5)
Eosinophils Relative: 3 %
Immature Granulocytes: 0 %
Lymphocytes Relative: 24 %
Lymphs Abs: 2 10*3/uL (ref 0.7–4.0)
Monocytes Absolute: 0.5 10*3/uL (ref 0.1–1.0)
Monocytes Relative: 7 %
Neutro Abs: 5.1 10*3/uL (ref 1.7–7.7)
Neutrophils Relative %: 65 %

## 2019-08-20 LAB — CBC
HCT: 32.8 % — ABNORMAL LOW (ref 36.0–46.0)
Hemoglobin: 11 g/dL — ABNORMAL LOW (ref 12.0–15.0)
MCH: 31.6 pg (ref 26.0–34.0)
MCHC: 33.5 g/dL (ref 30.0–36.0)
MCV: 94.3 fL (ref 80.0–100.0)
Platelets: 169 10*3/uL (ref 150–400)
RBC: 3.48 MIL/uL — ABNORMAL LOW (ref 3.87–5.11)
RDW: 13.8 % (ref 11.5–15.5)
WBC: 8 10*3/uL (ref 4.0–10.5)
nRBC: 0 % (ref 0.0–0.2)

## 2019-08-20 LAB — I-STAT CHEM 8, ED
BUN: 30 mg/dL — ABNORMAL HIGH (ref 8–23)
Calcium, Ion: 1.24 mmol/L (ref 1.15–1.40)
Chloride: 103 mmol/L (ref 98–111)
Creatinine, Ser: 1.8 mg/dL — ABNORMAL HIGH (ref 0.44–1.00)
Glucose, Bld: 194 mg/dL — ABNORMAL HIGH (ref 70–99)
HCT: 32 % — ABNORMAL LOW (ref 36.0–46.0)
Hemoglobin: 10.9 g/dL — ABNORMAL LOW (ref 12.0–15.0)
Potassium: 4.4 mmol/L (ref 3.5–5.1)
Sodium: 136 mmol/L (ref 135–145)
TCO2: 27 mmol/L (ref 22–32)

## 2019-08-20 LAB — APTT: aPTT: 27 seconds (ref 24–36)

## 2019-08-20 LAB — ETHANOL: Alcohol, Ethyl (B): <10 mg/dL (ref <10)

## 2019-08-20 LAB — GLUCOSE, CAPILLARY: Glucose-Capillary: 106 mg/dL — ABNORMAL HIGH (ref 70–99)

## 2019-08-20 MED ORDER — ATORVASTATIN CALCIUM 20 MG PO TABS
20.0000 mg | ORAL_TABLET | Freq: Every morning | ORAL | Status: DC
  Administered 2019-08-21: 20 mg via ORAL
  Filled 2019-08-20: qty 1

## 2019-08-20 MED ORDER — ONDANSETRON HCL 4 MG PO TABS: 4.0000 mg | ORAL_TABLET | Freq: Four times a day (QID) | ORAL | Status: DC | PRN

## 2019-08-20 MED ORDER — DONEPEZIL HCL 5 MG PO TABS
10.0000 mg | ORAL_TABLET | Freq: Every day | ORAL | Status: DC
  Administered 2019-08-20: 10 mg via ORAL
  Filled 2019-08-20: qty 2

## 2019-08-20 MED ORDER — ONDANSETRON HCL 4 MG/2ML IJ SOLN: 4.0000 mg | Freq: Four times a day (QID) | INTRAMUSCULAR | Status: DC | PRN

## 2019-08-20 MED ORDER — ACETAMINOPHEN 650 MG RE SUPP: 650.0000 mg | Freq: Four times a day (QID) | RECTAL | Status: DC | PRN

## 2019-08-20 MED ORDER — ACETAMINOPHEN 325 MG PO TABS: 650.0000 mg | ORAL_TABLET | Freq: Four times a day (QID) | ORAL | Status: DC | PRN

## 2019-08-20 MED ORDER — INSULIN ASPART 100 UNIT/ML ~~LOC~~ SOLN: 0.0000 [IU] | Freq: Every day | SUBCUTANEOUS | Status: DC

## 2019-08-20 MED ORDER — SERTRALINE HCL 50 MG PO TABS
100.0000 mg | ORAL_TABLET | Freq: Every morning | ORAL | Status: DC
  Administered 2019-08-21: 10:00:00 100 mg via ORAL
  Filled 2019-08-20: qty 2

## 2019-08-20 MED ORDER — ASPIRIN EC 325 MG PO TBEC
325.0000 mg | DELAYED_RELEASE_TABLET | Freq: Once | ORAL | Status: AC
  Administered 2019-08-21: 325 mg via ORAL
  Filled 2019-08-20: qty 1

## 2019-08-20 MED ORDER — HYDRALAZINE HCL 20 MG/ML IJ SOLN: 10.0000 mg | INTRAMUSCULAR | Status: DC | PRN

## 2019-08-20 MED ORDER — HEPARIN SODIUM (PORCINE) 5000 UNIT/ML IJ SOLN
5000.0000 [IU] | Freq: Three times a day (TID) | INTRAMUSCULAR | Status: DC
  Administered 2019-08-20 – 2019-08-21 (×3): 5000 [IU] via SUBCUTANEOUS
  Filled 2019-08-20 (×3): qty 1

## 2019-08-20 MED ORDER — INSULIN ASPART 100 UNIT/ML ~~LOC~~ SOLN
0.0000 [IU] | Freq: Three times a day (TID) | SUBCUTANEOUS | Status: DC
  Administered 2019-08-21: 3 [IU] via SUBCUTANEOUS

## 2019-08-20 MED ORDER — QUETIAPINE FUMARATE 25 MG PO TABS
25.0000 mg | ORAL_TABLET | Freq: Every morning | ORAL | Status: DC
  Administered 2019-08-21: 25 mg via ORAL
  Filled 2019-08-20: qty 1

## 2019-08-20 MED ORDER — LORAZEPAM 0.5 MG PO TABS
0.5000 mg | ORAL_TABLET | Freq: Once | ORAL | Status: AC
  Administered 2019-08-20: 0.5 mg via ORAL
  Filled 2019-08-20: qty 1

## 2019-08-20 MED ORDER — LEVOTHYROXINE SODIUM 75 MCG PO TABS
150.0000 ug | ORAL_TABLET | Freq: Every morning | ORAL | Status: DC
  Administered 2019-08-21: 10:00:00 150 ug via ORAL
  Filled 2019-08-20: qty 6

## 2019-08-20 MED ORDER — POLYETHYLENE GLYCOL 3350 17 G PO PACK: 17.0000 g | PACK | Freq: Every day | ORAL | Status: DC | PRN

## 2019-08-20 MED ORDER — LORAZEPAM 2 MG/ML IJ SOLN
0.5000 mg | Freq: Once | INTRAMUSCULAR | Status: AC
  Administered 2019-08-20: 0.5 mg via INTRAVENOUS
  Filled 2019-08-20: qty 1

## 2019-08-20 MED ORDER — INSULIN GLARGINE 100 UNIT/ML ~~LOC~~ SOLN
30.0000 [IU] | Freq: Every day | SUBCUTANEOUS | Status: DC
  Administered 2019-08-20: 30 [IU] via SUBCUTANEOUS
  Filled 2019-08-20 (×2): qty 0.3

## 2019-08-20 MED ORDER — PNEUMOCOCCAL VAC POLYVALENT 25 MCG/0.5ML IJ INJ: 0.5000 mL | INJECTION | INTRAMUSCULAR | Status: DC

## 2019-08-20 NOTE — ED Notes (Signed)
Pt was informed that we need a urine sample. Pt states she is not able to urinate at this moment.

## 2019-08-20 NOTE — ED Provider Notes (Signed)
[facility] EMERGENCY DEPARTMENT Provider Note   CSN: [id number] Arrival date & time: 08/20/19  1422     History Chief Complaint  Patient presents with  . Weakness    Courtney [name] is a 74 y.o. female presents today with her husband for weakness.  Patient reports that for the past 1 week she has had right-sided weakness of her arm and leg which has remained unchanged.  She reports that this weakness has caused her difficulty ambulating around her home and she has had multiple falls in the last week.  She denies any injuries or pain pertaining to her falls.  She reports that she was eating lunch with her husband today at 62 AM and was having difficulty getting up out of her chair secondary to weakness.  At that time patient's husband noticed facial asymmetry.  He reports that facial droop lasted approximately 45 minutes before spontaneously resolving.  He had attempted to get patient a bedside commode to use in the kitchen in a wheelchair which is why there was a delay in contacting EMS for transport to this ED.  At time of evaluation patient reports that she is feeling well, she reports that her right-sided weakness is still unchanged over the past 1 week she has no other complaints.  Denies fever/chills, blood thinner use, head injury, loss of consciousness, headache, neck pain, back pain, chest pain, abdominal pain, nausea/vomiting, dysuria/hematuria, swelling/color change, numbness, vision changes, speech difficulty or any additional concerns.  HPI     Past Medical History:  Diagnosis Date  . Cancer ([place])   . Diabetes ([place])   . Hypertension     Patient Active Problem List   Diagnosis Date Noted  . TIA (transient ischemic attack) 08/20/2019  . Hypothyroidism 01/23/2019  . HTN (hypertension) 01/23/2019  . Hypothermia 01/22/2019  . Osteoarthritis of left knee 01/23/2013    Past Surgical History:  Procedure Laterality Date  . ABDOMINAL HYSTERECTOMY    . COLONOSCOPY N/A  05/14/2015   Procedure: COLONOSCOPY;  Surgeon: Rogene Houston, MD;  Location: [facility];  Service: Endoscopy;  Laterality: N/A;  730  . OOPHORECTOMY       OB History   No obstetric history on file.     Family History  Problem Relation Age of Onset  . Rheum arthritis Mother   . Heart failure Mother   . Hypertension Mother   . Stroke Father   . Diabetes Brother   . Diabetes Sister   . Hypertension Maternal Grandmother     Social History   Tobacco Use  . Smoking status: Former Smoker    Packs/day: 0.50    Years: 10.00    Pack years: 5.00    Types: Cigarettes    Quit date: 05/22/2004    Years since quitting: 15.2  . Smokeless tobacco: Never Used  Substance Use Topics  . Alcohol use: Yes    Alcohol/week: 0.0 standard drinks    Comment: social  . Drug use: No    Home Medications Prior to Admission medications   Medication Sig Start Date End Date Taking? Authorizing Provider  acetaminophen (TYLENOL) 325 MG tablet Take 2 tablets (650 mg total) by mouth every 6 (six) hours as needed for mild pain, fever or headache (or Fever >/= 101). 01/24/19  Yes Emokpae, Courage, MD  amLODipine (NORVASC) 5 MG tablet Take 5 mg by mouth daily.    Yes [provider]  aspirin EC 325 MG tablet Take 325 mg by mouth once.  Yes [provider]  atorvastatin (LIPITOR) 20 MG tablet Take 20 mg by mouth every morning. 08/12/19  Yes [provider]  donepezil (ARICEPT) 10 MG tablet Take 10 mg by mouth at bedtime.  03/20/17  Yes [provider]  ferrous sulfate 325 (65 FE) MG tablet Take 325 mg by mouth daily with breakfast.   Yes [provider]  furosemide (LASIX) 20 MG tablet Take 40 mg by mouth every morning.    Yes [provider]  labetalol (NORMODYNE) 200 MG tablet Take 400 mg by mouth 2 (two) times daily.   Yes [provider]  LANTUS SOLOSTAR 100 UNIT/ML Solostar Pen Inject 40 Units into the skin at bedtime. Patient taking  differently: Inject 50 Units into the skin at bedtime.  01/24/19  Yes Roxan Hockey, MD  levothyroxine (SYNTHROID) 150 MCG tablet Take 150 mcg by mouth every morning. 08/14/19  Yes [provider]  lisinopril (ZESTRIL) 5 MG tablet Take 5 mg by mouth daily.   Yes [provider]  Omega 3 1000 MG CAPS Take 1 capsule by mouth daily.   Yes [provider]  QUEtiapine (SEROQUEL) 25 MG tablet Take 25 mg by mouth every morning.  03/07/19  Yes [provider]  sertraline (ZOLOFT) 100 MG tablet Take 100 mg by mouth every morning.  04/20/17  Yes [provider]    Allergies    Patient has no known allergies.  Review of Systems   Review of Systems Ten systems are reviewed and are negative for acute change except as noted in the HPI Physical Exam Updated Vital Signs BP 136/70   Pulse (!) 57   Temp 97.6 F (36.4 C) (Oral)   Resp 18   Ht 5\' 1"  (1.549 m)   SpO2 98%   BMI 43.95 kg/m   Physical Exam Constitutional:      General: She is not in acute distress.    Appearance: Normal appearance. She is well-developed. She is not ill-appearing or diaphoretic.  HENT:     Head: Normocephalic and atraumatic.     Right Ear: External ear normal.     Left Ear: External ear normal.     Nose: Nose normal.  Eyes:     General: Vision grossly intact. Gaze aligned appropriately.     Pupils: Pupils are equal, round, and reactive to light.  Neck:     Trachea: Trachea and phonation normal. No tracheal deviation.  Pulmonary:     Effort: Pulmonary effort is normal. No respiratory distress.  Abdominal:     General: There is no distension.     Palpations: Abdomen is soft.     Tenderness: There is no abdominal tenderness. There is no guarding or rebound.  Musculoskeletal:        General: Normal range of motion.     Cervical back: Normal range of motion.  Skin:    General: Skin is warm and dry.  Neurological:     Mental Status: She is alert.     GCS: GCS eye  subscore is 4. GCS verbal subscore is 5. GCS motor subscore is 6.     Comments: Mental Status: Alert, oriented, thought content appropriate, able to give a coherent history. Speech fluent without evidence of aphasia. Able to follow 2 step commands without difficulty. Cranial Nerves: II: Peripheral visual fields grossly normal, pupils equal, round, reactive to light III,IV, VI: ptosis not present, extra-ocular motions intact bilaterally V,VII: smile symmetric, eyebrows raise symmetric, facial light touch sensation  equal VIII: hearing grossly normal to voice X: uvula elevates symmetrically XI: bilateral shoulder shrug symmetric and strong XII: midline tongue extension without fassiculations Motor: Normal tone.  Questionable weakness right upper extremity push compared to left.  Equal full strength.  5/5 strength in lower extremities bilaterally including strong and equal dorsiflexion/plantar flexion. Sensory: Sensation intact to light touch in all extremities.  Cerebellar: normal finger-to-nose with bilateral upper extremities. Normal heel-to -shin balance bilaterally of the lower extremity. No pronator drift.  CV: distal pulses palpable throughout  Psychiatric:        Behavior: Behavior normal.     ED Results / Procedures / Treatments   Labs (all labs ordered are listed, but only abnormal results are displayed) Labs Reviewed  CBC - Abnormal; Notable for the following components:      Result Value   RBC 3.48 (*)    Hemoglobin 11.0 (*)    HCT 32.8 (*)    All other components within normal limits  COMPREHENSIVE METABOLIC PANEL - Abnormal; Notable for the following components:   Glucose, Bld 197 (*)    BUN 31 (*)    Creatinine, Ser 1.77 (*)    AST 14 (*)    GFR calc non Af Amer 28 (*)    GFR calc Af Amer 32 (*)    All other components within normal limits  I-STAT CHEM 8, ED - Abnormal; Notable for the following components:   BUN 30 (*)    Creatinine, Ser 1.80 (*)     Glucose, Bld 194 (*)    Hemoglobin 10.9 (*)    HCT 32.0 (*)    All other components within normal limits  SARS CORONAVIRUS 2 (TAT 6-24 HRS)  ETHANOL  PROTIME-INR  APTT  DIFFERENTIAL  RAPID URINE DRUG SCREEN, HOSP PERFORMED  URINALYSIS, ROUTINE W REFLEX MICROSCOPIC    EKG EKG Interpretation  Date/Time:  Tuesday August 20 2019 14:42:58 EDT Ventricular Rate:  55 PR Interval:    QRS Duration: 88 QT Interval:  456 QTC Calculation: 437 R Axis:   78 Text Interpretation: Sinus rhythm Low voltage, precordial leads since last tracing no significant change Confirmed by Daleen Bo [phone]) on 08/20/2019 2:54:35 PM   Radiology CT HEAD WO CONTRAST  Result Date: 08/20/2019 CLINICAL DATA:  Uncomplicated neck pain. Status post fall 5 times in the last week. EXAM: CT HEAD WITHOUT CONTRAST CT CERVICAL SPINE WITHOUT CONTRAST TECHNIQUE: Multidetector CT imaging of the head and cervical spine was performed following the standard protocol without intravenous contrast. Multiplanar CT image reconstructions of the cervical spine were also generated. COMPARISON:  04/24/2014 FINDINGS: CT HEAD FINDINGS Brain: No evidence of acute infarction, hemorrhage, extra-axial collection, ventriculomegaly, or mass effect. Generalized cerebral atrophy. Periventricular white matter low attenuation likely secondary to microangiopathy. Vascular: Cerebrovascular atherosclerotic calcifications are noted. Skull: Negative for fracture or focal lesion. Sinuses/Orbits: Visualized portions of the orbits are unremarkable. Visualized portions of the paranasal sinuses are unremarkable. Visualized portions of the mastoid air cells are unremarkable. Other: None. CT CERVICAL SPINE FINDINGS Alignment: 3 mm anterolisthesis of C3 on C4 and C4 on C5. Skull base and vertebrae: No acute fracture. No primary bone lesion or focal pathologic process. Mineralized pannus formation along the posterior aspect of the dens. Soft tissues and spinal canal: No  prevertebral fluid or swelling. No visible canal hematoma. Disc levels: Degenerative disease with severe disc height loss at C5-6, C6-7, C7-T1 and T1-2. severe bilateral facet arthropathy at C2-3. At C3-4 there is severe bilateral facet arthropathy and  bilateral foraminal encroachment. At C4-5 there is severe bilateral facet arthropathy. At C5-6 there is a broad-based disc osteophyte complex, bilateral uncovertebral degenerative changes and bilateral foraminal encroachment. At C6-7 there is severe degenerative disease with disc height loss, broad-based disc osteophyte complex and bilateral foraminal encroachment. Bilateral foraminal encroachment at C7-T1. Upper chest: Lung apices are clear. Other: No fluid collection or hematoma. IMPRESSION: 1. No acute intracranial pathology. 2. No acute osseous injury the cervical spine. 3. Cervical spine spondylosis as described above. Electronically Signed   By: Kathreen Devoid   On: 08/20/2019 15:34   CT Cervical Spine Wo Contrast  Result Date: 08/20/2019 CLINICAL DATA:  Uncomplicated neck pain. Status post fall 5 times in the last week. EXAM: CT HEAD WITHOUT CONTRAST CT CERVICAL SPINE WITHOUT CONTRAST TECHNIQUE: Multidetector CT imaging of the head and cervical spine was performed following the standard protocol without intravenous contrast. Multiplanar CT image reconstructions of the cervical spine were also generated. COMPARISON:  04/24/2014 FINDINGS: CT HEAD FINDINGS Brain: No evidence of acute infarction, hemorrhage, extra-axial collection, ventriculomegaly, or mass effect. Generalized cerebral atrophy. Periventricular white matter low attenuation likely secondary to microangiopathy. Vascular: Cerebrovascular atherosclerotic calcifications are noted. Skull: Negative for fracture or focal lesion. Sinuses/Orbits: Visualized portions of the orbits are unremarkable. Visualized portions of the paranasal sinuses are unremarkable. Visualized portions of the mastoid air cells are  unremarkable. Other: None. CT CERVICAL SPINE FINDINGS Alignment: 3 mm anterolisthesis of C3 on C4 and C4 on C5. Skull base and vertebrae: No acute fracture. No primary bone lesion or focal pathologic process. Mineralized pannus formation along the posterior aspect of the dens. Soft tissues and spinal canal: No prevertebral fluid or swelling. No visible canal hematoma. Disc levels: Degenerative disease with severe disc height loss at C5-6, C6-7, C7-T1 and T1-2. severe bilateral facet arthropathy at C2-3. At C3-4 there is severe bilateral facet arthropathy and bilateral foraminal encroachment. At C4-5 there is severe bilateral facet arthropathy. At C5-6 there is a broad-based disc osteophyte complex, bilateral uncovertebral degenerative changes and bilateral foraminal encroachment. At C6-7 there is severe degenerative disease with disc height loss, broad-based disc osteophyte complex and bilateral foraminal encroachment. Bilateral foraminal encroachment at C7-T1. Upper chest: Lung apices are clear. Other: No fluid collection or hematoma. IMPRESSION: 1. No acute intracranial pathology. 2. No acute osseous injury the cervical spine. 3. Cervical spine spondylosis as described above. Electronically Signed   By: Kathreen Devoid   On: 08/20/2019 15:34    Procedures Procedures (including critical care time)  Medications Ordered in ED Medications - No data to display  ED Course  I have reviewed the triage vital signs and the nursing notes.  Pertinent labs & imaging results that were available during my care of the patient were reviewed by me and considered in my medical decision making (see chart for details).    MDM Rules/Calculators/A&P                     74 year old female presents primarily for resolved right-sided facial droop noticed by her husband at 60 AM this morning while eating lunch.  Symptoms spontaneously resolved after approximately 45 minutes per her husband.  Additionally patient with  questionable right-sided weakness for the past 1 week which has caused her to fall multiple times without injury per patient.  She is well-appearing pleasant no acute distress, there is questionable right upper extremity weakness compared to left otherwise strength is equal and intact bilaterally and no cranial nerve deficits.  No evidence  of injury of the head, neck, chest, abdomen, pelvis or extremities.  Concern patient may have suffered a TIA today, work-up begun in addition to CT head/C-spine, will consult teleneuro for their input.  Patient seen and evaluated by Dr. Eulis Foster who agrees with care plan. - CT Head/Cspine:  IMPRESSION:  1. No acute intracranial pathology.  2. No acute osseous injury the cervical spine.  3. Cervical spine spondylosis as described above.   EKG: Sinus rhythm Low voltage, precordial leads since last tracing no significant change Confirmed by Daleen Bo [phone]) on 08/20/2019 2:54:35 PM  CBC hemoglobin 11.0, no bleeding symptoms, doubt symptomatic anemia, no leukocytosis to suggest infection.  CMP shows elevation of creatinine and BUN which appear baseline no emergent electrolyte derangement or elevation of LFTs.  PT/INR and APTT within normal limits.  Ethanol negative, no evidence of intoxication or withdrawal.  Urinalysis and UDS pending.  Patient reassessed resting comfortably no acute distress agreeable for admission. - Discussed case with neurologist Dr. Elissa Hefty who recommends admission for TIA work-up. - Discussed case with Dr. Denton Brick who accepts patient for admission and further work-up.  Note: Portions of this report may have been transcribed using voice recognition software. Every effort was made to ensure accuracy; however, inadvertent computerized transcription errors may still be present. Final Clinical Impression(s) / ED Diagnoses Final diagnoses:  TIA (transient ischemic attack)    Rx / DC Orders ED Discharge Orders    None       Gari Crown 08/20/19 1621    Daleen Bo, MD 08/22/19 [phone]

## 2019-08-20 NOTE — Progress Notes (Signed)
Has ongoing memory impairment according to husband.  Patient not oriented to date or year but interacts appropriately.  No facial droop or assymmetry and strength is equal on both sides but does complain of chronic knee problems.

## 2019-08-20 NOTE — Telephone Encounter (Signed)
Patient's daughter called with concerns the patient has fallen 4 times in the last week. She said she has also been having diarrhea right after falling.   She is concerned about the strength of her mom's legs and expressed concern her mom may be declining. Patient is now being told not to get up without using her walker. Please advise.

## 2019-08-20 NOTE — ED Provider Notes (Signed)
  Face-to-face evaluation   History: She presents by EMS for evaluation of weakness.  Ongoing for 1 week with several falls.  Today at lunchtime, her husband noticed that her right lip was not working like the left side was and at that time she was confused.  These symptoms lasted for about 45 minutes and resolved.  She has chronic confusion from reported "Alzheimer's."  Physical exam: She is alert responsive follows commands well.  She is confused and defers answers to questions to husband.  No facial asymmetry.  Able to extend and hold arms off the stretcher bilaterally, as well as legs.     Medical screening examination/treatment/procedure(s) were conducted as a shared visit with non-physician practitioner(s) and myself.  I personally evaluated the patient during the encounter    Daleen Bo, MD 08/22/19 [phone]

## 2019-08-20 NOTE — H&P (Signed)
History and Physical    Courtney [name] MBW:[id number] DOB: 05/24/1945 DOA: 08/20/2019  PCP: Cory Munch, PA-C   Patient coming from: Home  I have personally briefly reviewed patient's old medical records in [place]  Chief Complaint: Weakness, mouth drooping on right side  HPI: Courtney [name] is a 74 y.o. female with medical history significant for hypothyroidism, diabetes mellitus, hypertension. Husband is present at bedside and helps with most of the history.  Patient was brought to the ED via EMS reports of falls up to 5 times in the past week.  Patient also complained of weakness involving her right side present over the past week.  Patient attributes her falls to weakness of her right side. Patient was having lunch with her husband at about 11 AM, patient spouse reported that patient was unable to get out of the chair, and this had never happened before, spouse then noticed facial asymmetry involving the patient lips, with right side of her upper lip tilting upward, this lasted about 45 minutes.  At the same time patient became confused, this lasted about 1 hour.  Every time EMS arrived both of these had resolved and patient was back to baseline.  At baseline patient ambulates without assistance, but over the past 2 days she has needed her walker.  At baseline patient has dementia with memory problems, she is able to answer simple questions but has short-term memory problems. Patient denies any difficulty breathing no cough, no fever or chills, she reports bowel movements after every meal 2-3 times daily-ongoing for several weeks to months, no vomiting, no abdominal pain, no pain with urination.  ED Course: Stable vitals.  Creatinine 1.7, otherwise no unremarkable CBC CMP.  Head CT and cervical spine CT- no acute intracranial abnormality, no acute osseous injury to the cervical spine, cervical spine spondylosis. Neurology consulted recommended MRI brain MRA head and neck, if  noncontributory will recommend MRI thoracic and lumbar spine.  Also aspirin, echocardiogram.   Review of Systems: As per HPI all other systems reviewed and negative.  Past Medical History:  Diagnosis Date  . Cancer ([place])   . Diabetes ([place])   . Hypertension     Past Surgical History:  Procedure Laterality Date  . ABDOMINAL HYSTERECTOMY    . COLONOSCOPY N/A 05/14/2015   Procedure: COLONOSCOPY;  Surgeon: Rogene Houston, MD;  Location: [facility];  Service: Endoscopy;  Laterality: N/A;  730  . OOPHORECTOMY       reports that she quit smoking about 15 years ago. Her smoking use included cigarettes. She has a 5.00 pack-year smoking history. She has never used smokeless tobacco. She reports current alcohol use. She reports that she does not use drugs.  No Known Allergies  Family History  Problem Relation Age of Onset  . Rheum arthritis Mother   . Heart failure Mother   . Hypertension Mother   . Stroke Father   . Diabetes Brother   . Diabetes Sister   . Hypertension Maternal Grandmother     Prior to Admission medications   Medication Sig Start Date End Date Taking? Authorizing Provider  acetaminophen (TYLENOL) 325 MG tablet Take 2 tablets (650 mg total) by mouth every 6 (six) hours as needed for mild pain, fever or headache (or Fever >/= 101). 01/24/19  Yes Jona Zappone, Courage, MD  amLODipine (NORVASC) 5 MG tablet Take 5 mg by mouth daily.    Yes [provider]  aspirin EC 325 MG tablet Take 325  mg by mouth once.   Yes [provider]  atorvastatin (LIPITOR) 20 MG tablet Take 20 mg by mouth every morning. 08/12/19  Yes [provider]  donepezil (ARICEPT) 10 MG tablet Take 10 mg by mouth at bedtime.  03/20/17  Yes [provider]  ferrous sulfate 325 (65 FE) MG tablet Take 325 mg by mouth daily with breakfast.   Yes [provider]  furosemide (LASIX) 20 MG tablet Take 40 mg by mouth every morning.    Yes [provider]    labetalol (NORMODYNE) 200 MG tablet Take 400 mg by mouth 2 (two) times daily.   Yes [provider]  LANTUS SOLOSTAR 100 UNIT/ML Solostar Pen Inject 40 Units into the skin at bedtime. Patient taking differently: Inject 50 Units into the skin at bedtime.  01/24/19  Yes Roxan Hockey, MD  levothyroxine (SYNTHROID) 150 MCG tablet Take 150 mcg by mouth every morning. 08/14/19  Yes [provider]  lisinopril (ZESTRIL) 5 MG tablet Take 5 mg by mouth daily.   Yes [provider]  Omega 3 1000 MG CAPS Take 1 capsule by mouth daily.   Yes [provider]  QUEtiapine (SEROQUEL) 25 MG tablet Take 25 mg by mouth every morning.  03/07/19  Yes [provider]  sertraline (ZOLOFT) 100 MG tablet Take 100 mg by mouth every morning.  04/20/17  Yes [provider]    Physical Exam: Vitals:   08/20/19 1613 08/20/19 1614 08/20/19 1615 08/20/19 1616  BP:      Pulse: (!) 52 (!) 58 (!) 56 (!) 57  Resp: 16 19 19 18   Temp:      TempSrc:      SpO2: 100% 99% 97% 98%  Height:        Constitutional: NAD, calm, comfortable Vitals:   08/20/19 1613 08/20/19 1614 08/20/19 1615 08/20/19 1616  BP:      Pulse: (!) 52 (!) 58 (!) 56 (!) 57  Resp: 16 19 19 18   Temp:      TempSrc:      SpO2: 100% 99% 97% 98%  Height:       Eyes: PERRL, lids and conjunctivae normal ENMT: Mucous membranes are moist. Posterior pharynx clear of any exudate or lesions. Neck: normal, supple, no masses, no thyromegaly Respiratory: clear to auscultation bilaterally, no wheezing, no crackles. Normal respiratory effort. No accessory muscle use.  Cardiovascular: Regular rate and rhythm, no murmurs / rubs / gallops. No extremity edema. 2+ pedal pulses.   Abdomen: no tenderness, no masses palpated. No hepatosplenomegaly. Bowel sounds positive.  Musculoskeletal: no clubbing / cyanosis. No joint deformity upper and lower extremities. Good ROM, no contractures. Normal muscle tone.  Skin: no  rashes, lesions, ulcers. No induration Neurologic:  Neurological:  Speech fluent without evidence of aphasia. Able to follow 2 step commands without difficulty.  Cranial Nerves:  II:  Peripheral visual fields grossly normal, pupils equal, round, reactive to light III,IV, VI: ptosis not present, extra-ocular motions intact bilaterally  V,VII: smile symmetric, eyebrows raise symmetric, facial light touch sensation equal VIII: hearing grossly normal to voice  X: Unable to test  XI: bilateral shoulder shrug symmetric and strong XII: midline tongue extension without fassiculations Motor:  Strength 4 + /5 in all extremities Normal tone.   Sensory: Sensation intact to light touch in all extremities.  CV: distal pulses palpable throughout  Psychiatric: Awake and alert, oriented to person and place, recognizes spouse at bedside, not oriented to situation or  time.  She is unable to tell me why she is in the hospital, or what she ate for breakfast.  Labs on Admission: I have personally reviewed following labs and imaging studies  CBC: Recent Labs  Lab 08/20/19 1453 08/20/19 1502  WBC 8.0  --   NEUTROABS 5.1  --   HGB 11.0* 10.9*  HCT 32.8* 32.0*  MCV 94.3  --   PLT 169  --    Basic Metabolic Panel: Recent Labs  Lab 08/20/19 1453 08/20/19 1502  NA 136 136  K 4.5 4.4  CL 102 103  CO2 25  --   GLUCOSE 197* 194*  BUN 31* 30*  CREATININE 1.77* 1.80*  CALCIUM 9.0  --    GFR: CrCl cannot be calculated (Unknown ideal weight.). Liver Function Tests: Recent Labs  Lab 08/20/19 1453  AST 14*  ALT 14  ALKPHOS 50  BILITOT 0.4  PROT 6.5  ALBUMIN 3.6   Coagulation Profile: Recent Labs  Lab 08/20/19 1453  INR 0.9    Radiological Exams on Admission: CT HEAD WO CONTRAST  Result Date: 08/20/2019 CLINICAL DATA:  Uncomplicated neck pain. Status post fall 5 times in the last week. EXAM: CT HEAD WITHOUT CONTRAST CT CERVICAL SPINE WITHOUT CONTRAST TECHNIQUE: Multidetector CT imaging  of the head and cervical spine was performed following the standard protocol without intravenous contrast. Multiplanar CT image reconstructions of the cervical spine were also generated. COMPARISON:  04/24/2014 FINDINGS: CT HEAD FINDINGS Brain: No evidence of acute infarction, hemorrhage, extra-axial collection, ventriculomegaly, or mass effect. Generalized cerebral atrophy. Periventricular white matter low attenuation likely secondary to microangiopathy. Vascular: Cerebrovascular atherosclerotic calcifications are noted. Skull: Negative for fracture or focal lesion. Sinuses/Orbits: Visualized portions of the orbits are unremarkable. Visualized portions of the paranasal sinuses are unremarkable. Visualized portions of the mastoid air cells are unremarkable. Other: None. CT CERVICAL SPINE FINDINGS Alignment: 3 mm anterolisthesis of C3 on C4 and C4 on C5. Skull base and vertebrae: No acute fracture. No primary bone lesion or focal pathologic process. Mineralized pannus formation along the posterior aspect of the dens. Soft tissues and spinal canal: No prevertebral fluid or swelling. No visible canal hematoma. Disc levels: Degenerative disease with severe disc height loss at C5-6, C6-7, C7-T1 and T1-2. severe bilateral facet arthropathy at C2-3. At C3-4 there is severe bilateral facet arthropathy and bilateral foraminal encroachment. At C4-5 there is severe bilateral facet arthropathy. At C5-6 there is a broad-based disc osteophyte complex, bilateral uncovertebral degenerative changes and bilateral foraminal encroachment. At C6-7 there is severe degenerative disease with disc height loss, broad-based disc osteophyte complex and bilateral foraminal encroachment. Bilateral foraminal encroachment at C7-T1. Upper chest: Lung apices are clear. Other: No fluid collection or hematoma. IMPRESSION: 1. No acute intracranial pathology. 2. No acute osseous injury the cervical spine. 3. Cervical spine spondylosis as described above.  Electronically Signed   By: Kathreen Devoid   On: 08/20/2019 15:34   CT Cervical Spine Wo Contrast  Result Date: 08/20/2019 CLINICAL DATA:  Uncomplicated neck pain. Status post fall 5 times in the last week. EXAM: CT HEAD WITHOUT CONTRAST CT CERVICAL SPINE WITHOUT CONTRAST TECHNIQUE: Multidetector CT imaging of the head and cervical spine was performed following the standard protocol without intravenous contrast. Multiplanar CT image reconstructions of the cervical spine were also generated. COMPARISON:  04/24/2014 FINDINGS: CT HEAD FINDINGS Brain: No evidence of acute infarction, hemorrhage, extra-axial collection, ventriculomegaly, or mass effect. Generalized cerebral atrophy. Periventricular white matter low attenuation likely secondary to microangiopathy. Vascular:  Cerebrovascular atherosclerotic calcifications are noted. Skull: Negative for fracture or focal lesion. Sinuses/Orbits: Visualized portions of the orbits are unremarkable. Visualized portions of the paranasal sinuses are unremarkable. Visualized portions of the mastoid air cells are unremarkable. Other: None. CT CERVICAL SPINE FINDINGS Alignment: 3 mm anterolisthesis of C3 on C4 and C4 on C5. Skull base and vertebrae: No acute fracture. No primary bone lesion or focal pathologic process. Mineralized pannus formation along the posterior aspect of the dens. Soft tissues and spinal canal: No prevertebral fluid or swelling. No visible canal hematoma. Disc levels: Degenerative disease with severe disc height loss at C5-6, C6-7, C7-T1 and T1-2. severe bilateral facet arthropathy at C2-3. At C3-4 there is severe bilateral facet arthropathy and bilateral foraminal encroachment. At C4-5 there is severe bilateral facet arthropathy. At C5-6 there is a broad-based disc osteophyte complex, bilateral uncovertebral degenerative changes and bilateral foraminal encroachment. At C6-7 there is severe degenerative disease with disc height loss, broad-based disc  osteophyte complex and bilateral foraminal encroachment. Bilateral foraminal encroachment at C7-T1. Upper chest: Lung apices are clear. Other: No fluid collection or hematoma. IMPRESSION: 1. No acute intracranial pathology. 2. No acute osseous injury the cervical spine. 3. Cervical spine spondylosis as described above. Electronically Signed   By: Kathreen Devoid   On: 08/20/2019 15:34    EKG: Independently reviewed. Sinus bradycardia, except for low voltage on prior EKG, there is no significant ST or T wave change.   Assessment/Plan Principal Problem:   TIA (transient ischemic attack) Active Problems:   Hypothyroidism   HTN (hypertension)   DM (diabetes mellitus) ([place])   Right-sided weakness-with facial asymmetry.  Neurologic exam unremarkable-appears to have more of a generalized weakness Head CT, cervical CT, no acute intracranial pathology.,  No acute osseous cervical spine injury.  Telemetry neurology consulted recommended MRI head, MRA head and neck and if no contributing treatment MRI thoracic and lumbar spine. -PT OT speech therapy evaluation -Resume home aspirin,  -MRI head, MRA head and neck -Lipid panel, hemoglobin A1c -Allow for permissive hypertension - Echo  Dementia-baseline memory problems, oriented to person and place but not situation or time. -Resume home Seroquel, Zoloft, donepezil  Diabetes mellitus-random glucose 197 -Resume home Lantus at reduced dose 30 units QHS (home dose 50 units) - SSI- M  Hypertension-systolic 989 - 211 -With soft blood pressure, and allowing for permissive hypertension, hold home labetalol, Norvasc 5 mg, Lasix 40 mg daily, lisinopril 5mg  - PRN hydralazine for blood pressure systolic > 941.  Hypothyroidism -Resume home Synthroid -Check TSH  DVT prophylaxis: Heparin Code Status: Full code Family Communication: Spouse at bedside Disposition Plan: 1 to 2 days Consults called: None Admission status: Observation, telemetry   Bethena Roys MD [facility]  08/20/2019, 4:26 PM

## 2019-08-20 NOTE — Consult Note (Signed)
[facility] Tele[facility]urology Consult Services  Stat Consult  Date of Service:   08/20/2019 15:14:26  Impression:     .  G93.49 - Encephalopathy Multifactorial  Comments/Sign-Out: Pt has had a subacute onset of confusion and rt LE weakness. Patients clinical features can be compatible with diagnosis of acute ischemic stroke however other vascular and non-vascular conditions that present with an acute neurological deficit simulating acute ischemic stroke is possible. Chief differential include: Thoracic/lumbar pathology Toxic metabolic disturbance  CT HEAD: Showed No Acute Hemorrhage or Acute Core Infarct Reviewed 1. No acute intracranial pathology. 2. No acute osseous injury the cervical spine.  Metrics: [facility] Notification Time: 08/20/2019 15:12:50 Stamp Time: 08/20/2019 15:14:26 Callback Response Time: 08/20/2019 15:15:05  Our recommendations are outlined below.  Recommendations:     .  Initiate Aspirin 81 MG Daily     .  If MRI brain is non contributory, will recommend MRI thoracic and lumbar spine  Imaging Studies:     .  MRI Head with and Without Contrast     .  MRA Head and Neck Without Contrast When Available - Stroke Protocol     .  Echocardiogram - Transthoracic Echocardiogram  Therapies:     .  Physical Therapy, Occupational Therapy, Speech Therapy Assessment When Applicable  Other WorkUp:     .  Infectious/metabolic workup per primary team  Disposition: Neurology Follow Up Recommended  Sign Out:     .  Discussed with Emergency Department Provider  ----------------------------------------------------------------------------------------------------  Chief Complaint: Confusion  History of Present Illness: Patient is a 74 year old Female.  73/F. h/o DM (on lantus), Hypothyroid,HTN, HDL, Dementia, last week pts husband reports that she fell 5 - 6 days. This afternoon, she used the walker to the table and was unable to get up. Her  husband noted a rt facial droop, pt was also extremely confused. She was unable to stand up. This confusion resolved in 1 hour.   Past Medical History:     . Hypertension     . Diabetes Mellitus     . Hyperlipidemia      Examination: BP(144/66), Pulse(52), Blood Glucose(194) 1A: Level of Consciousness - Alert; keenly responsive + 0 1B: Ask Month and Age - Could Not Answer Either Question Correctly + 2 1C: Blink Eyes & Squeeze Hands - Performs Both Tasks + 0 2: Test Horizontal Extraocular Movements - Normal + 0 3: Test Visual Fields - No Visual Loss + 0 4: Test Facial Palsy (Use Grimace if Obtunded) - Normal symmetry + 0 5A: Test Left Arm Motor Drift - No Drift for 10 Seconds + 0 5B: Test Right Arm Motor Drift - No Drift for 10 Seconds + 0 6A: Test Left Leg Motor Drift - No Drift for 5 Seconds + 0 6B: Test Right Leg Motor Drift - Drift, but doesn't hit bed + 1 7: Test Limb Ataxia (FNF/Heel-Shin) - No Ataxia + 0 8: Test Sensation - Normal; No sensory loss + 0 9: Test Language/Aphasia - Normal; No aphasia + 0 10: Test Dysarthria - Normal + 0 11: Test Extinction/Inattention - No abnormality + 0  NIHSS Score: 3   Patient/Family was informed the Neurology Consult would occur via TeleHealth consult by way of interactive audio and video telecommunications and consented to receiving care in this manner.  Due to the immediate potential for life-threatening deterioration due to underlying acute neurologic illness, I spent 35 minutes providing critical care. This time includes time for face to face visit via telemedicine,  review of medical records, imaging studies and discussion of findings with providers, the patient and/or family.   Dr Suzi Roots Janesia Joswick   [facility] [phone]  Case [id number]

## 2019-08-20 NOTE — Telephone Encounter (Signed)
Spoke with patients daughter and advised her to follow up with PCP.  She is agreeable.

## 2019-08-20 NOTE — ED Triage Notes (Signed)
Per EMS they were called out for weakness. Pt and her husband report generalized weakness for one week, husband reports pt has fallen x 5 in the last weak. Husband reports at home he noticed her mouth drooping on one side this am. Stroke scale 0 at this time

## 2019-08-21 ENCOUNTER — Observation Stay (HOSPITAL_BASED_OUTPATIENT_CLINIC_OR_DEPARTMENT_OTHER): Payer: PPO

## 2019-08-21 ENCOUNTER — Observation Stay (HOSPITAL_COMMUNITY): Payer: PPO

## 2019-08-21 DIAGNOSIS — E7849 Other hyperlipidemia: Secondary | ICD-10-CM | POA: Diagnosis not present

## 2019-08-21 DIAGNOSIS — Z794 Long term (current) use of insulin: Secondary | ICD-10-CM

## 2019-08-21 DIAGNOSIS — E039 Hypothyroidism, unspecified: Secondary | ICD-10-CM

## 2019-08-21 DIAGNOSIS — E1149 Type 2 diabetes mellitus with other diabetic neurological complication: Secondary | ICD-10-CM | POA: Diagnosis not present

## 2019-08-21 DIAGNOSIS — F0390 Unspecified dementia without behavioral disturbance: Secondary | ICD-10-CM | POA: Diagnosis not present

## 2019-08-21 DIAGNOSIS — E1169 Type 2 diabetes mellitus with other specified complication: Secondary | ICD-10-CM | POA: Diagnosis not present

## 2019-08-21 DIAGNOSIS — I639 Cerebral infarction, unspecified: Secondary | ICD-10-CM | POA: Diagnosis not present

## 2019-08-21 DIAGNOSIS — E1122 Type 2 diabetes mellitus with diabetic chronic kidney disease: Secondary | ICD-10-CM | POA: Diagnosis not present

## 2019-08-21 DIAGNOSIS — E785 Hyperlipidemia, unspecified: Secondary | ICD-10-CM | POA: Diagnosis not present

## 2019-08-21 DIAGNOSIS — N183 Chronic kidney disease, stage 3 unspecified: Secondary | ICD-10-CM | POA: Diagnosis not present

## 2019-08-21 DIAGNOSIS — I361 Nonrheumatic tricuspid (valve) insufficiency: Secondary | ICD-10-CM

## 2019-08-21 DIAGNOSIS — I6523 Occlusion and stenosis of bilateral carotid arteries: Secondary | ICD-10-CM | POA: Diagnosis not present

## 2019-08-21 DIAGNOSIS — I129 Hypertensive chronic kidney disease with stage 1 through stage 4 chronic kidney disease, or unspecified chronic kidney disease: Secondary | ICD-10-CM | POA: Diagnosis not present

## 2019-08-21 LAB — URINALYSIS, ROUTINE W REFLEX MICROSCOPIC
Bacteria, UA: NONE SEEN
Bilirubin Urine: NEGATIVE
Glucose, UA: NEGATIVE mg/dL
Ketones, ur: NEGATIVE mg/dL
Leukocytes,Ua: NEGATIVE
Nitrite: NEGATIVE
Protein, ur: 30 mg/dL — AB
Specific Gravity, Urine: 1.011 (ref 1.005–1.030)
pH: 5 (ref 5.0–8.0)

## 2019-08-21 LAB — LIPID PANEL
Cholesterol: 220 mg/dL — ABNORMAL HIGH (ref 0–200)
HDL: 44 mg/dL (ref >40)
LDL Cholesterol: 110 mg/dL — ABNORMAL HIGH (ref 0–99)
Total CHOL/HDL Ratio: 5 RATIO
Triglycerides: 328 mg/dL — ABNORMAL HIGH (ref <150)
VLDL: 66 mg/dL — ABNORMAL HIGH (ref 0–40)

## 2019-08-21 LAB — RAPID URINE DRUG SCREEN, HOSP PERFORMED
Amphetamines: NOT DETECTED
Barbiturates: NOT DETECTED
Benzodiazepines: POSITIVE — AB
Cocaine: NOT DETECTED
Opiates: NOT DETECTED
Tetrahydrocannabinol: NOT DETECTED

## 2019-08-21 LAB — ECHOCARDIOGRAM COMPLETE
Height: 61 in
Weight: 3442.7 oz

## 2019-08-21 LAB — SARS CORONAVIRUS 2 (TAT 6-24 HRS): SARS Coronavirus 2: NEGATIVE

## 2019-08-21 LAB — TSH: TSH: 0.436 u[IU]/mL (ref 0.350–4.500)

## 2019-08-21 LAB — GLUCOSE, CAPILLARY
Glucose-Capillary: 82 mg/dL (ref 70–99)
Glucose-Capillary: 168 mg/dL — ABNORMAL HIGH (ref 70–99)
Glucose-Capillary: 156 mg/dL — ABNORMAL HIGH (ref 70–99)

## 2019-08-21 LAB — HEMOGLOBIN A1C
Hgb A1c MFr Bld: 7.9 % — ABNORMAL HIGH (ref 4.8–5.6)
Mean Plasma Glucose: 180.03 mg/dL

## 2019-08-21 MED ORDER — LORAZEPAM 2 MG/ML IJ SOLN
1.0000 mg | Freq: Once | INTRAMUSCULAR | Status: AC
  Administered 2019-08-21: 1 mg via INTRAVENOUS
  Filled 2019-08-21: qty 1

## 2019-08-21 MED ORDER — PNEUMOCOCCAL VAC POLYVALENT 25 MCG/0.5ML IJ INJ
0.5000 mL | INJECTION | Freq: Once | INTRAMUSCULAR | Status: AC
  Administered 2019-08-21: 0.5 mL via INTRAMUSCULAR
  Filled 2019-08-21 (×2): qty 0.5

## 2019-08-21 MED ORDER — ASPIRIN EC 325 MG PO TBEC: 325.0000 mg | DELAYED_RELEASE_TABLET | Freq: Every day | ORAL | 0 refills | Status: DC

## 2019-08-21 MED ORDER — ATORVASTATIN CALCIUM 40 MG PO TABS: 40.0000 mg | ORAL_TABLET | Freq: Every morning | ORAL | 0 refills | Status: DC

## 2019-08-21 NOTE — Discharge Summary (Signed)
Physician Discharge Summary  Courtney [name] DQQ:[id number] DOB: 06/24/1945 DOA: 08/20/2019  PCP: Cory Munch, PA-C  Admit date: 08/20/2019 Discharge date: 08/21/2019  Admitted From: Home Disposition: Home  Recommendations for Outpatient Follow-up:  1. Follow up with PCP in 1-2 weeks 2. Please obtain BMP/CBC in one week 3. Follow-up with neurology as an outpatient  Home Health: Home health PT Equipment/Devices:  Discharge Condition: Stable CODE STATUS: Full code Diet recommendation: Heart healthy, carb modified  Brief/Interim Summary: 74 year old female admitted to the hospital with generalized weakness, frequent falls and drooping of the right side of her mouth.  CT head performed in the emergency room was unrevealing.  She was seen by tele-neurology who recommended MRI brain she is admitted for further work-up for possible stroke  Discharge Diagnoses:  Principal Problem:   CVA (cerebral vascular accident) ([place]) Active Problems:   Hypothyroidism   HTN (hypertension)   DM (diabetes mellitus) ([place])   Type 2 diabetes mellitus with hyperlipidemia ([place])  1. Acute left basal ganglia infarct.  Noted on MRI imaging brain.  No significant lesions in carotids bilaterally.  Echocardiogram also unrevealing.  Was noted to be a 3 mm linear protrusion from the left ventricle aspect of the distal basilar artery which may be an infundibulum or small aneurysm.  This will need to be further worked up as an outpatient.  Case was reviewed with Dr. Merlene Laughter with recommendations start the patient on daily aspirin.  She is also on statin.  She was seen by physical therapy with recommendations for home health physical therapy.  Dr. Merlene Laughter will follow her up as an outpatient. 2. Hypertension.  Blood pressure currently stable on current regimen.  We will continue the same. 3. Hyperlipidemia.  LDL noted to be above target.  Increase Lipitor from 20 to 40 mg daily. 4. Diabetes.  A1c elevated at 7.9.  This  will need further adjustment in the outpatient setting. 5. Hypothyroidism.  Continue Synthroid.  Discharge Instructions  Discharge Instructions    Diet - low sodium heart healthy   Complete by: As directed    Increase activity slowly   Complete by: As directed      Allergies as of 08/21/2019   No Known Allergies     Medication List    TAKE these medications   acetaminophen 325 MG tablet Commonly known as: TYLENOL Take 2 tablets (650 mg total) by mouth every 6 (six) hours as needed for mild pain, fever or headache (or Fever >/= 101).   amLODipine 5 MG tablet Commonly known as: NORVASC Take 5 mg by mouth daily.   aspirin EC 325 MG tablet Take 1 tablet (325 mg total) by mouth daily. What changed: when to take this   atorvastatin 40 MG tablet Commonly known as: LIPITOR Take 1 tablet (40 mg total) by mouth every morning. What changed:   medication strength  how much to take   donepezil 10 MG tablet Commonly known as: ARICEPT Take 10 mg by mouth at bedtime.   ferrous sulfate 325 (65 FE) MG tablet Take 325 mg by mouth daily with breakfast.   furosemide 20 MG tablet Commonly known as: LASIX Take 40 mg by mouth every morning.   labetalol 200 MG tablet Commonly known as: NORMODYNE Take 400 mg by mouth 2 (two) times daily.   Lantus SoloStar 100 UNIT/ML Solostar Pen Generic drug: insulin glargine Inject 40 Units into the skin at bedtime. What changed: how much to take   levothyroxine 150 MCG tablet Commonly  known as: SYNTHROID Take 150 mcg by mouth every morning.   lisinopril 5 MG tablet Commonly known as: ZESTRIL Take 5 mg by mouth daily.   Omega 3 1000 MG Caps Take 1 capsule by mouth daily.   QUEtiapine 25 MG tablet Commonly known as: SEROQUEL Take 25 mg by mouth every morning.   sertraline 100 MG tablet Commonly known as: ZOLOFT Take 100 mg by mouth every morning.      Follow-up Information    Phillips Odor, MD. Schedule an appointment as soon  as possible for a visit in 2 week(s).   Specialty: Neurology Contact information: [address] [place] [address] [phone]        Cory Munch, PA-C. Schedule an appointment as soon as possible for a visit in 2 week(s).   Specialties: Physician Assistant, Internal Medicine Contact information: [place] [address] [phone]          No Known Allergies  Consultations:     Procedures/Studies: CT HEAD WO CONTRAST  Result Date: 08/20/2019 CLINICAL DATA:  Uncomplicated neck pain. Status post fall 5 times in the last week. EXAM: CT HEAD WITHOUT CONTRAST CT CERVICAL SPINE WITHOUT CONTRAST TECHNIQUE: Multidetector CT imaging of the head and cervical spine was performed following the standard protocol without intravenous contrast. Multiplanar CT image reconstructions of the cervical spine were also generated. COMPARISON:  04/24/2014 FINDINGS: CT HEAD FINDINGS Brain: No evidence of acute infarction, hemorrhage, extra-axial collection, ventriculomegaly, or mass effect. Generalized cerebral atrophy. Periventricular white matter low attenuation likely secondary to microangiopathy. Vascular: Cerebrovascular atherosclerotic calcifications are noted. Skull: Negative for fracture or focal lesion. Sinuses/Orbits: Visualized portions of the orbits are unremarkable. Visualized portions of the paranasal sinuses are unremarkable. Visualized portions of the mastoid air cells are unremarkable. Other: None. CT CERVICAL SPINE FINDINGS Alignment: 3 mm anterolisthesis of C3 on C4 and C4 on C5. Skull base and vertebrae: No acute fracture. No primary bone lesion or focal pathologic process. Mineralized pannus formation along the posterior aspect of the dens. Soft tissues and spinal canal: No prevertebral fluid or swelling. No visible canal hematoma. Disc levels: Degenerative disease with severe disc height loss at C5-6, C6-7, C7-T1 and T1-2. severe bilateral facet  arthropathy at C2-3. At C3-4 there is severe bilateral facet arthropathy and bilateral foraminal encroachment. At C4-5 there is severe bilateral facet arthropathy. At C5-6 there is a broad-based disc osteophyte complex, bilateral uncovertebral degenerative changes and bilateral foraminal encroachment. At C6-7 there is severe degenerative disease with disc height loss, broad-based disc osteophyte complex and bilateral foraminal encroachment. Bilateral foraminal encroachment at C7-T1. Upper chest: Lung apices are clear. Other: No fluid collection or hematoma. IMPRESSION: 1. No acute intracranial pathology. 2. No acute osseous injury the cervical spine. 3. Cervical spine spondylosis as described above. Electronically Signed   By: Kathreen Devoid   On: 08/20/2019 15:34   CT Cervical Spine Wo Contrast  Result Date: 08/20/2019 CLINICAL DATA:  Uncomplicated neck pain. Status post fall 5 times in the last week. EXAM: CT HEAD WITHOUT CONTRAST CT CERVICAL SPINE WITHOUT CONTRAST TECHNIQUE: Multidetector CT imaging of the head and cervical spine was performed following the standard protocol without intravenous contrast. Multiplanar CT image reconstructions of the cervical spine were also generated. COMPARISON:  04/24/2014 FINDINGS: CT HEAD FINDINGS Brain: No evidence of acute infarction, hemorrhage, extra-axial collection, ventriculomegaly, or mass effect. Generalized cerebral atrophy. Periventricular white matter low attenuation likely secondary to microangiopathy. Vascular: Cerebrovascular atherosclerotic calcifications are noted. Skull: Negative  for fracture or focal lesion. Sinuses/Orbits: Visualized portions of the orbits are unremarkable. Visualized portions of the paranasal sinuses are unremarkable. Visualized portions of the mastoid air cells are unremarkable. Other: None. CT CERVICAL SPINE FINDINGS Alignment: 3 mm anterolisthesis of C3 on C4 and C4 on C5. Skull base and vertebrae: No acute fracture. No primary bone  lesion or focal pathologic process. Mineralized pannus formation along the posterior aspect of the dens. Soft tissues and spinal canal: No prevertebral fluid or swelling. No visible canal hematoma. Disc levels: Degenerative disease with severe disc height loss at C5-6, C6-7, C7-T1 and T1-2. severe bilateral facet arthropathy at C2-3. At C3-4 there is severe bilateral facet arthropathy and bilateral foraminal encroachment. At C4-5 there is severe bilateral facet arthropathy. At C5-6 there is a broad-based disc osteophyte complex, bilateral uncovertebral degenerative changes and bilateral foraminal encroachment. At C6-7 there is severe degenerative disease with disc height loss, broad-based disc osteophyte complex and bilateral foraminal encroachment. Bilateral foraminal encroachment at C7-T1. Upper chest: Lung apices are clear. Other: No fluid collection or hematoma. IMPRESSION: 1. No acute intracranial pathology. 2. No acute osseous injury the cervical spine. 3. Cervical spine spondylosis as described above. Electronically Signed   By: Kathreen Devoid   On: 08/20/2019 15:34   MR ANGIO HEAD WO CONTRAST  Result Date: 08/20/2019 CLINICAL DATA:  Encephalopathy. Right-sided weakness with falls and facial asymmetry. EXAM: MRI HEAD WITHOUT CONTRAST MRA HEAD WITHOUT CONTRAST MRA NECK WITHOUT CONTRAST TECHNIQUE: Multiplanar, multiecho pulse sequences of the brain and surrounding structures were obtained without intravenous contrast. Angiographic images of the Circle of Willis were obtained using MRA technique without intravenous contrast. Angiographic images of the neck were obtained using MRA technique without intravenous contrast. Carotid stenosis measurements (when applicable) are obtained utilizing NASCET criteria, using the distal internal carotid diameter as the denominator. COMPARISON:  None. FINDINGS: MRI HEAD FINDINGS BRAIN: There is an area of abnormal diffusion restriction within the posterior left basal ganglia.  Multifocal white matter hyperintensity, most commonly due to chronic ischemic microangiopathy. The CSF spaces are normal for age, with no hydrocephalus. Blood-sensitive sequences show no chronic microhemorrhage or superficial siderosis. SKULL AND UPPER CERVICAL SPINE: The visualized skull base, calvarium, upper cervical spine and extracranial soft tissues are normal. SINUSES/ORBITS: No fluid levels or advanced mucosal thickening. No mastoid or middle ear effusion. The orbits are normal. MRA HEAD FINDINGS POSTERIOR CIRCULATION: --Vertebral arteries: Normal V4 segments. --Posterior inferior cerebellar arteries (PICA): Patent origins from the vertebral arteries. --Anterior inferior cerebellar arteries (AICA): Patent origins from the basilar artery. --Basilar artery: 3 mm linear protrusion projecting anterior and to the left from the tip of the basilar artery, possibly a remnant P1 segment infundibulum. --Superior cerebellar arteries: Normal. --Posterior cerebral arteries: Normal. The left PCA is predominantly supplied by the posterior communicating artery. The right PCA is partially supplied by the P-comm. ANTERIOR CIRCULATION: --Intracranial internal carotid arteries: Normal. --Anterior cerebral arteries (ACA): Normal. Both A1 segments are present. Patent anterior communicating artery (a-comm). --Middle cerebral arteries (MCA): Normal. MRA NECK FINDINGS Time-of-flight MRA images of the neck are severely motion degraded. No visible carotid stenosis. IMPRESSION: 1. Acute left basal ganglia small vessel infarct. No hemorrhage or mass effect. 2. Small (3 mm) linear protrusion from the left ventral aspect of the distal basilar artery may be an infundibulum or small aneurysm. Follow-up non-emergent CTA of the head may be helpful for further characterization. Normal intracranial MRA. 3. Severely motion degraded MRA of the neck without visible hemodynamically significant stenosis. Electronically Signed  By: Ulyses Jarred  M.D.   On: 08/20/2019 21:38   MR ANGIO NECK WO CONTRAST  Result Date: 08/20/2019 CLINICAL DATA:  Encephalopathy. Right-sided weakness with falls and facial asymmetry. EXAM: MRI HEAD WITHOUT CONTRAST MRA HEAD WITHOUT CONTRAST MRA NECK WITHOUT CONTRAST TECHNIQUE: Multiplanar, multiecho pulse sequences of the brain and surrounding structures were obtained without intravenous contrast. Angiographic images of the Circle of Willis were obtained using MRA technique without intravenous contrast. Angiographic images of the neck were obtained using MRA technique without intravenous contrast. Carotid stenosis measurements (when applicable) are obtained utilizing NASCET criteria, using the distal internal carotid diameter as the denominator. COMPARISON:  None. FINDINGS: MRI HEAD FINDINGS BRAIN: There is an area of abnormal diffusion restriction within the posterior left basal ganglia. Multifocal white matter hyperintensity, most commonly due to chronic ischemic microangiopathy. The CSF spaces are normal for age, with no hydrocephalus. Blood-sensitive sequences show no chronic microhemorrhage or superficial siderosis. SKULL AND UPPER CERVICAL SPINE: The visualized skull base, calvarium, upper cervical spine and extracranial soft tissues are normal. SINUSES/ORBITS: No fluid levels or advanced mucosal thickening. No mastoid or middle ear effusion. The orbits are normal. MRA HEAD FINDINGS POSTERIOR CIRCULATION: --Vertebral arteries: Normal V4 segments. --Posterior inferior cerebellar arteries (PICA): Patent origins from the vertebral arteries. --Anterior inferior cerebellar arteries (AICA): Patent origins from the basilar artery. --Basilar artery: 3 mm linear protrusion projecting anterior and to the left from the tip of the basilar artery, possibly a remnant P1 segment infundibulum. --Superior cerebellar arteries: Normal. --Posterior cerebral arteries: Normal. The left PCA is predominantly supplied by the posterior  communicating artery. The right PCA is partially supplied by the P-comm. ANTERIOR CIRCULATION: --Intracranial internal carotid arteries: Normal. --Anterior cerebral arteries (ACA): Normal. Both A1 segments are present. Patent anterior communicating artery (a-comm). --Middle cerebral arteries (MCA): Normal. MRA NECK FINDINGS Time-of-flight MRA images of the neck are severely motion degraded. No visible carotid stenosis. IMPRESSION: 1. Acute left basal ganglia small vessel infarct. No hemorrhage or mass effect. 2. Small (3 mm) linear protrusion from the left ventral aspect of the distal basilar artery may be an infundibulum or small aneurysm. Follow-up non-emergent CTA of the head may be helpful for further characterization. Normal intracranial MRA. 3. Severely motion degraded MRA of the neck without visible hemodynamically significant stenosis. Electronically Signed   By: Ulyses Jarred M.D.   On: 08/20/2019 21:38   MR BRAIN WO CONTRAST  Result Date: 08/20/2019 CLINICAL DATA:  Encephalopathy. Right-sided weakness with falls and facial asymmetry. EXAM: MRI HEAD WITHOUT CONTRAST MRA HEAD WITHOUT CONTRAST MRA NECK WITHOUT CONTRAST TECHNIQUE: Multiplanar, multiecho pulse sequences of the brain and surrounding structures were obtained without intravenous contrast. Angiographic images of the Circle of Willis were obtained using MRA technique without intravenous contrast. Angiographic images of the neck were obtained using MRA technique without intravenous contrast. Carotid stenosis measurements (when applicable) are obtained utilizing NASCET criteria, using the distal internal carotid diameter as the denominator. COMPARISON:  None. FINDINGS: MRI HEAD FINDINGS BRAIN: There is an area of abnormal diffusion restriction within the posterior left basal ganglia. Multifocal white matter hyperintensity, most commonly due to chronic ischemic microangiopathy. The CSF spaces are normal for age, with no hydrocephalus.  Blood-sensitive sequences show no chronic microhemorrhage or superficial siderosis. SKULL AND UPPER CERVICAL SPINE: The visualized skull base, calvarium, upper cervical spine and extracranial soft tissues are normal. SINUSES/ORBITS: No fluid levels or advanced mucosal thickening. No mastoid or middle ear effusion. The orbits are normal. MRA HEAD FINDINGS POSTERIOR CIRCULATION: --Vertebral  arteries: Normal V4 segments. --Posterior inferior cerebellar arteries (PICA): Patent origins from the vertebral arteries. --Anterior inferior cerebellar arteries (AICA): Patent origins from the basilar artery. --Basilar artery: 3 mm linear protrusion projecting anterior and to the left from the tip of the basilar artery, possibly a remnant P1 segment infundibulum. --Superior cerebellar arteries: Normal. --Posterior cerebral arteries: Normal. The left PCA is predominantly supplied by the posterior communicating artery. The right PCA is partially supplied by the P-comm. ANTERIOR CIRCULATION: --Intracranial internal carotid arteries: Normal. --Anterior cerebral arteries (ACA): Normal. Both A1 segments are present. Patent anterior communicating artery (a-comm). --Middle cerebral arteries (MCA): Normal. MRA NECK FINDINGS Time-of-flight MRA images of the neck are severely motion degraded. No visible carotid stenosis. IMPRESSION: 1. Acute left basal ganglia small vessel infarct. No hemorrhage or mass effect. 2. Small (3 mm) linear protrusion from the left ventral aspect of the distal basilar artery may be an infundibulum or small aneurysm. Follow-up non-emergent CTA of the head may be helpful for further characterization. Normal intracranial MRA. 3. Severely motion degraded MRA of the neck without visible hemodynamically significant stenosis. Electronically Signed   By: Ulyses Jarred M.D.   On: 08/20/2019 21:38   US Carotid Bilateral  Result Date: 08/21/2019 CLINICAL DATA:  Syncope, possible stroke EXAM: BILATERAL CAROTID DUPLEX  ULTRASOUND TECHNIQUE: Pearline Cables scale imaging, color Doppler and duplex ultrasound were performed of bilateral carotid and vertebral arteries in the neck. COMPARISON:  None. FINDINGS: Criteria: Quantification of carotid stenosis is based on velocity parameters that correlate the residual internal carotid diameter with NASCET-based stenosis levels, using the diameter of the distal internal carotid lumen as the denominator for stenosis measurement. The following velocity measurements were obtained: RIGHT ICA: 86/25 cm/sec CCA: 48/18 cm/sec SYSTOLIC ICA/CCA RATIO:  1.0 ECA: 106 cm/sec LEFT ICA: 95/16 cm/sec CCA: 56/31 cm/sec SYSTOLIC ICA/CCA RATIO:  1.0 ECA: 95 cm/sec RIGHT CAROTID ARTERY: Eccentric nonocclusive plaque in the mid common carotid artery. Eccentric nonocclusive plaque in the bulb and proximal ICA, without high-grade stenosis. Normal waveforms and color Doppler signal. RIGHT VERTEBRAL ARTERY:  Normal flow direction and waveform. LEFT CAROTID ARTERY: Intimal thickening and mild plaque in the common carotid artery. Eccentric partially calcified plaque in the bulb without high-grade stenosis. Normal waveforms and color Doppler signal. Moderately tortuous ICA. LEFT VERTEBRAL ARTERY:  Normal flow direction and waveform. IMPRESSION: 1. Bilateral carotid bifurcation plaque resulting in less than 50% diameter ICA stenosis. 2. Antegrade bilateral vertebral arterial flow. Electronically Signed   By: Lucrezia Europe M.D.   On: 08/21/2019 15:33   ECHOCARDIOGRAM COMPLETE  Result Date: 08/21/2019    ECHOCARDIOGRAM REPORT   Patient Name:   Courtney [name] Date of Exam: 08/21/2019 Medical Rec #:  [id number]     Height:       61.0 in Accession #:    [id number]    Weight:       215.2 lb Date of Birth:  Dec 07, 1945    BSA:          1.948 m Patient Age:    74 years      BP:           137/71 mmHg Patient Gender: F             HR:           61 bpm. Exam Location:  [facility] Procedure: 2D Echo Indications:    TIA 435.9 / G45.9  History:         Patient has no prior history of  Echocardiogram examinations.                 TIA; Risk Factors:Diabetes, Hypertension and Former Smoker.                 Hypothermia, Cancer.  Sonographer:    Leavy Cella RDCS (AE) Referring Phys: [place]  1. Left ventricular ejection fraction, by estimation, is 60 to 65%. The left ventricle has normal function. The left ventricle has no regional wall motion abnormalities. There is mild left ventricular hypertrophy of the posterior segment. Left ventricular diastolic parameters are consistent with Grade II diastolic dysfunction (pseudonormalization). Elevated left ventricular end-diastolic pressure.  2. Right ventricular systolic function is normal. The right ventricular size is normal. There is mildly elevated pulmonary artery systolic pressure.  3. The mitral valve is grossly normal. Trivial mitral valve regurgitation.  4. The aortic valve is tricuspid. Aortic valve regurgitation is not visualized. No aortic stenosis is present.  5. The inferior vena cava is normal in size with greater than 50% respiratory variability, suggesting right atrial pressure of 3 mmHg. FINDINGS  Left Ventricle: Left ventricular ejection fraction, by estimation, is 60 to 65%. The left ventricle has normal function. The left ventricle has no regional wall motion abnormalities. The left ventricular internal cavity size was normal in size. There is  mild left ventricular hypertrophy of the posterior segment. Left ventricular diastolic parameters are consistent with Grade II diastolic dysfunction (pseudonormalization). Elevated left ventricular end-diastolic pressure. Right Ventricle: The right ventricular size is normal. No increase in right ventricular wall thickness. Right ventricular systolic function is normal. There is mildly elevated pulmonary artery systolic pressure. The tricuspid regurgitant velocity is 2.68  m/s, and with an assumed right atrial pressure of 10  mmHg, the estimated right ventricular systolic pressure is 09.7 mmHg. Left Atrium: Left atrial size was normal in size. Right Atrium: Right atrial size was normal in size. Pericardium: There is no evidence of pericardial effusion. Mitral Valve: The mitral valve is grossly normal. Mild mitral annular calcification. Trivial mitral valve regurgitation. Tricuspid Valve: The tricuspid valve is grossly normal. Tricuspid valve regurgitation is mild. Aortic Valve: The aortic valve is tricuspid. Aortic valve regurgitation is not visualized. No aortic stenosis is present. Mild aortic valve annular calcification. Pulmonic Valve: The pulmonic valve was grossly normal. Pulmonic valve regurgitation is not visualized. Aorta: The aortic root is normal in size and structure. Venous: The inferior vena cava is normal in size with greater than 50% respiratory variability, suggesting right atrial pressure of 3 mmHg. IAS/Shunts: No atrial level shunt detected by color flow Doppler.  LEFT VENTRICLE PLAX 2D LVIDd:         4.59 cm  Diastology LVIDs:         2.34 cm  LV e' lateral:   6.42 cm/s LV PW:         1.11 cm  LV E/e' lateral: 16.8 LV IVS:        0.97 cm  LV e' medial:    5.22 cm/s LVOT diam:     1.90 cm  LV E/e' medial:  20.7 LVOT Area:     2.84 cm  RIGHT VENTRICLE RV S prime:     18.50 cm/s TAPSE (M-mode): 2.4 cm LEFT ATRIUM             Index LA diam:        3.50 cm 1.80 cm/m LA Vol (A2C):   67.3 ml 34.54 ml/m LA Vol (A4C):  44.2 ml 22.69 ml/m LA Biplane Vol: 55.2 ml 28.33 ml/m   AORTA Ao Root diam: 3.10 cm MITRAL VALVE                TRICUSPID VALVE MV Area (PHT): 2.42 cm     TR Peak grad:   28.7 mmHg MV Decel Time: 313 msec     TR Vmax:        268.00 cm/s MV E velocity: 108.00 cm/s MV A velocity: 89.10 cm/s   SHUNTS MV E/A ratio:  1.21         Systemic Diam: 1.90 cm Kate Sable MD Electronically signed by Kate Sable MD Signature Date/Time: 08/21/2019/10:56:24 AM    Final       Subjective: No further facial  droop. Does not have any unilateral weakness or numbness. Feels better and wants to go home.  Discharge Exam: Vitals:   08/21/19 0428 08/21/19 0622 08/21/19 0830 08/21/19 1407  BP: (!) 154/82 (!) 142/70 137/71 (!) 149/75  Pulse: 68 (!) 56 61 65  Resp: 16 17 17 18   Temp: 98 F (36.7 C) 98.3 F (36.8 C) 98.2 F (36.8 C) 98 F (36.7 C)  TempSrc: Oral Oral Oral Oral  SpO2: 95% 98% 98% 98%  Weight:      Height:        General: Pt is alert, awake, not in acute distress Cardiovascular: RRR, S1/S2 +, no rubs, no gallops Respiratory: CTA bilaterally, no wheezing, no rhonchi Abdominal: Soft, NT, ND, bowel sounds + Extremities: no edema, no cyanosis    The results of significant diagnostics from this hospitalization (including imaging, microbiology, ancillary and laboratory) are listed below for reference.     Microbiology: Recent Results (from the past 240 hour(s))  SARS CORONAVIRUS 2 (TAT 6-24 HRS) Nasopharyngeal Nasopharyngeal Swab     Status: None   Collection Time: 08/20/19  4:18 PM   Specimen: Nasopharyngeal Swab  Result Value Ref Range Status   SARS Coronavirus 2 NEGATIVE NEGATIVE Final    Comment: (NOTE) SARS-CoV-2 target nucleic acids are NOT DETECTED. The SARS-CoV-2 RNA is generally detectable in upper and lower respiratory specimens during the acute phase of infection. Negative results do not preclude SARS-CoV-2 infection, do not rule out co-infections with other pathogens, and should not be used as the sole basis for treatment or other patient management decisions. Negative results must be combined with clinical observations, patient history, and epidemiological information. The expected result is Negative. Fact Sheet for Patients: SugarRoll.be Fact Sheet for Healthcare Providers: https://www.woods-mathews.com/ This test is not yet approved or cleared by the [place] FDA and  has been authorized for detection and/or  diagnosis of SARS-CoV-2 by FDA under an Emergency Use Authorization (EUA). This EUA will remain  in effect (meaning this test can be used) for the duration of the COVID-19 declaration under Section 56 4(b)(1) of the Act, 21 U.S.C. section 360bbb-3(b)(1), unless the authorization is terminated or revoked sooner. Performed at [facility], [place] [address], [place],  [address]      Labs: BNP (last 3 results) No results for input(s): BNP in the last 8760 hours. Basic Metabolic Panel: Recent Labs  Lab 08/20/19 1453 08/20/19 1502  NA 136 136  K 4.5 4.4  CL 102 103  CO2 25  --   GLUCOSE 197* 194*  BUN 31* 30*  CREATININE 1.77* 1.80*  CALCIUM 9.0  --    Liver Function Tests: Recent Labs  Lab 08/20/19 1453  AST 14*  ALT  14  ALKPHOS 50  BILITOT 0.4  PROT 6.5  ALBUMIN 3.6   No results for input(s): LIPASE, AMYLASE in the last 168 hours. No results for input(s): AMMONIA in the last 168 hours. CBC: Recent Labs  Lab 08/20/19 1453 08/20/19 1502  WBC 8.0  --   NEUTROABS 5.1  --   HGB 11.0* 10.9*  HCT 32.8* 32.0*  MCV 94.3  --   PLT 169  --    Cardiac Enzymes: No results for input(s): CKTOTAL, CKMB, CKMBINDEX, TROPONINI in the last 168 hours. BNP: Invalid input(s): POCBNP CBG: Recent Labs  Lab 08/20/19 2158 08/21/19 0739 08/21/19 1122 08/21/19 1629  GLUCAP 106* 82 168* 156*   D-Dimer No results for input(s): DDIMER in the last 72 hours. Hgb A1c Recent Labs    08/20/19 1453  HGBA1C 7.9*   Lipid Profile Recent Labs    08/21/19 0515  CHOL 220*  HDL 44  LDLCALC 110*  TRIG 328*  CHOLHDL 5.0   Thyroid function studies Recent Labs    08/20/19 1453  TSH 0.436   Anemia work up No results for input(s): VITAMINB12, FOLATE, FERRITIN, TIBC, IRON, RETICCTPCT in the last 72 hours. Urinalysis    Component Value Date/Time   COLORURINE YELLOW 08/20/2019 1134   APPEARANCEUR CLEAR 08/20/2019 1134   LABSPEC 1.011 08/20/2019 1134   PHURINE 5.0  08/20/2019 1134   GLUCOSEU NEGATIVE 08/20/2019 1134   HGBUR SMALL (A) 08/20/2019 1134   BILIRUBINUR NEGATIVE 08/20/2019 1134   KETONESUR NEGATIVE 08/20/2019 1134   PROTEINUR 30 (A) 08/20/2019 1134   UROBILINOGEN 0.2 04/24/2014 1406   NITRITE NEGATIVE 08/20/2019 1134   LEUKOCYTESUR NEGATIVE 08/20/2019 1134   Sepsis Labs Invalid input(s): PROCALCITONIN,  WBC,  LACTICIDVEN Microbiology Recent Results (from the past 240 hour(s))  SARS CORONAVIRUS 2 (TAT 6-24 HRS) Nasopharyngeal Nasopharyngeal Swab     Status: None   Collection Time: 08/20/19  4:18 PM   Specimen: Nasopharyngeal Swab  Result Value Ref Range Status   SARS Coronavirus 2 NEGATIVE NEGATIVE Final    Comment: (NOTE) SARS-CoV-2 target nucleic acids are NOT DETECTED. The SARS-CoV-2 RNA is generally detectable in upper and lower respiratory specimens during the acute phase of infection. Negative results do not preclude SARS-CoV-2 infection, do not rule out co-infections with other pathogens, and should not be used as the sole basis for treatment or other patient management decisions. Negative results must be combined with clinical observations, patient history, and epidemiological information. The expected result is Negative. Fact Sheet for Patients: SugarRoll.be Fact Sheet for Healthcare Providers: https://www.woods-mathews.com/ This test is not yet approved or cleared by the [place] FDA and  has been authorized for detection and/or diagnosis of SARS-CoV-2 by FDA under an Emergency Use Authorization (EUA). This EUA will remain  in effect (meaning this test can be used) for the duration of the COVID-19 declaration under Section 56 4(b)(1) of the Act, 21 U.S.C. section 360bbb-3(b)(1), unless the authorization is terminated or revoked sooner. Performed at [facility], [place] [address], [place], [place] [address]      Time coordinating discharge:  49mins  SIGNED:   Kathie Dike, MD  [facility] 08/21/2019, 8:44 PM   If 7PM-7AM, please contact night-coverage [email or web address]

## 2019-08-21 NOTE — Evaluation (Signed)
Physical Therapy Evaluation Patient Details Name: Courtney [name] MRN: [id number] DOB: 09-29-1945 Today's Date: 08/21/2019   History of Present Illness  Courtney [name] is a 74 y.o. female with medical history significant for hypothyroidism, diabetes mellitus, hypertension. Husband is present at bedside and helps with most of the history.  Patient was brought to the ED via EMS reports of falls up to 5 times in the past week.  Patient also complained of weakness involving her right side present over the past week.  Patient attributes her falls to weakness of her right side.    Clinical Impression  Patient functioning near baseline for functional mobility and gait, limited mostly due to c/o fatigue and generalized weakness, has to lean on nearby objects for support when not using an AD, requires the use of RW for safety demonstrating slow labored cadence without loss of balance and continued sitting up in chair after therapy with her daughter present in room.  Patient will benefit from continued physical therapy in hospital and recommended venue below to increase strength, balance, endurance for safe ADLs and gait.     Follow Up Recommendations Home health PT;Supervision for mobility/OOB;Supervision - Intermittent    Equipment Recommendations  None recommended by PT    Recommendations for Other Services       Precautions / Restrictions Precautions Precautions: Fall Precaution Comments: 5 falls in last week Restrictions Weight Bearing Restrictions: No      Mobility  Bed Mobility Overal bed mobility: Modified Independent             General bed mobility comments: slightly increased time  Transfers Overall transfer level: Needs assistance Equipment used: Rolling walker (2 wheeled) Transfers: Sit to/from [facility] Sit to Stand: Min guard Stand pivot transfers: Min guard       General transfer comment: patient has to lean on nearby objects for support when not  using AD, required use of RW for safety  Ambulation/Gait Ambulation/Gait assistance: Min guard Gait Distance (Feet): 65 Feet Assistive device: Rolling walker (2 wheeled) Gait Pattern/deviations: Decreased step length - right;Decreased step length - left;Decreased stride length Gait velocity: decreased   General Gait Details: has to lean on nearby objects for support when not using AD, required use of RW for safety demonstrating slow labored cadence without loss of balance, limited mostly due to fatigue  Stairs            Wheelchair Mobility    Modified Rankin (Stroke Patients Only)       Balance Overall balance assessment: Needs assistance Sitting-balance support: Feet supported;No upper extremity supported Sitting balance-Leahy Scale: Good Sitting balance - Comments: seated at EOB   Standing balance support: During functional activity;No upper extremity supported Standing balance-Leahy Scale: Poor Standing balance comment: fair using RW                             Pertinent Vitals/Pain Pain Assessment: No/denies pain    Home Living Family/patient expects to be discharged to:: Private residence Living Arrangements: Spouse/significant other Available Help at Discharge: Family;Available 24 hours/day Type of Home: House Home Access: Stairs to enter;Ramped entrance Entrance Stairs-Rails: Right;Left;Can reach both Entrance Stairs-Number of Steps: 2 Home Layout: One level Home Equipment: Walker - 2 wheels;Wheelchair - manual;Cane - single point;Shower seat;Bedside commode      Prior Function Level of Independence: Needs assistance   Gait / Transfers Assistance Needed: Patient normally a household ambultor without AD, but using  RW for past week with encouragement of family, however,  patient inconsistent with using RW per patient's daughter  ADL's / Homemaking Assistance Needed: Assisted by family due to cognition  Comments: daughter reports sedentary  lifestyle     Hand Dominance   Dominant Hand: Right    Extremity/Trunk Assessment   Upper Extremity Assessment Upper Extremity Assessment: Defer to OT evaluation    Lower Extremity Assessment Lower Extremity Assessment: Generalized weakness    Cervical / Trunk Assessment Cervical / Trunk Assessment: Normal  Communication   Communication: No difficulties  Cognition Arousal/Alertness: Awake/alert Behavior During Therapy: WFL for tasks assessed/performed Overall Cognitive Status: History of cognitive impairments - at baseline                                        General Comments      Exercises     Assessment/Plan    PT Assessment Patient needs continued PT services  PT Problem List Decreased strength;Decreased activity tolerance;Decreased balance;Decreased mobility       PT Treatment Interventions Balance training;Stair training;Functional mobility training;Therapeutic activities;Therapeutic exercise;Patient/family education;Gait training    PT Goals (Current goals can be found in the Care Plan section)  Acute Rehab PT Goals Patient Stated Goal: return home with family to assist PT Goal Formulation: With patient/family Time For Goal Achievement: 09/04/19 Potential to Achieve Goals: Good    Frequency Min 3X/week   Barriers to discharge        Co-evaluation               AM-PAC PT "6 Clicks" Mobility  Outcome Measure Help needed turning from your back to your side while in a flat bed without using bedrails?: None Help needed moving from lying on your back to sitting on the side of a flat bed without using bedrails?: None Help needed moving to and from a bed to a chair (including a wheelchair)?: A Little Help needed standing up from a chair using your arms (e.g., wheelchair or bedside chair)?: A Little Help needed to walk in hospital room?: A Little Help needed climbing 3-5 steps with a railing? : A Little 6 Click Score: 20    End  of Session   Activity Tolerance: Patient tolerated treatment well;Patient limited by fatigue Patient left: in chair;with call bell/phone within reach;with family/visitor present Nurse Communication: Mobility status PT Visit Diagnosis: Unsteadiness on feet (R26.81);Other abnormalities of gait and mobility (R26.89);Muscle weakness (generalized) (M62.81)    Time: 4627-0350 PT Time Calculation (min) (ACUTE ONLY): 27 min   Charges:   PT Evaluation $PT Eval Moderate Complexity: 1 Mod PT Treatments $Therapeutic Activity: 23-37 mins        11:23 AM, 08/21/19 Lonell Grandchild, MPT Physical Therapist with [facility] [phone] office [phone] mobile phone

## 2019-08-21 NOTE — Evaluation (Signed)
Occupational Therapy Evaluation Patient Details Name: Courtney [name] MRN: [id number] DOB: 1945/05/28 Today's Date: 08/21/2019    History of Present Illness Courtney [name] is a 74 y.o. female with medical history significant for hypothyroidism, diabetes mellitus, hypertension. Husband is present at bedside and helps with most of the history.  Patient was brought to the ED via EMS reports of falls up to 5 times in the past week.  Patient also complained of weakness involving her right side present over the past week.  Patient attributes her falls to weakness of her right side.   Clinical Impression   Pt agreeable to OT evaluation, daughter present for session. Pt performing ADLs with supervision/min guard this am. RW used for mobility, verbal cuing for correct use. Daughter reports family assists pt with all ADLs at baseline due to cognition. BUE strength is WNL, coordination and sensation are intact. Primary concerns at this time are LOB and frequent falling. Educated on removing any trip hazards in the home. Will defer discharge recommendations to PT as pt is performing ADLs at baseline. No further OT services required at this time.     Follow Up Recommendations  Supervision/Assistance - 24 hour    Equipment Recommendations  None recommended by OT       Precautions / Restrictions Precautions Precautions: Fall Precaution Comments: 5 falls in last week Restrictions Weight Bearing Restrictions: No      Mobility Bed Mobility Overal bed mobility: Modified Independent                Transfers Overall transfer level: Needs assistance Equipment used: Rolling walker (2 wheeled) Transfers: Sit to/from [facility] Sit to Stand: Min guard Stand pivot transfers: Min guard                ADL either performed or assessed with clinical judgement   ADL Overall ADL's : Needs assistance/impaired Eating/Feeding: Set up;Sitting Eating/Feeding Details (indicate cue  type and reason): daughter preparing tray for pt Grooming: Wash/dry hands;Supervision/safety;Standing Grooming Details (indicate cue type and reason): pt standing at sink for hand washing                 Toilet Transfer: Min guard;Regular Toilet;RW   Toileting- Clothing Manipulation and Hygiene: Supervision/safety;Sitting/lateral lean;Sit to/from stand       Functional mobility during ADLs: Supervision/safety;Min guard;Rolling walker General ADL Comments: Pt performing tasks with no LOB     Vision Baseline Vision/History: Wears glasses Wears Glasses: At all times Patient Visual Report: No change from baseline Vision Assessment?: No apparent visual deficits            Pertinent Vitals/Pain Pain Assessment: No/denies pain     Hand Dominance Right   Extremity/Trunk Assessment Upper Extremity Assessment Upper Extremity Assessment: Overall WFL for tasks assessed   Lower Extremity Assessment Lower Extremity Assessment: Defer to PT evaluation   Cervical / Trunk Assessment Cervical / Trunk Assessment: Normal   Communication Communication Communication: No difficulties   Cognition Arousal/Alertness: Awake/alert Behavior During Therapy: WFL for tasks assessed/performed Overall Cognitive Status: History of cognitive impairments - at baseline                                                Home Living Family/patient expects to be discharged to:: Private residence Living Arrangements: Spouse/significant other Available Help at Discharge: Family;Available 24 hours/day Type of  Home: House Home Access: Stairs to enter;Ramped entrance Entrance Stairs-Number of Steps: 2 Entrance Stairs-Rails: Right;Left;Can reach both Home Layout: One level     Bathroom Shower/Tub: Occupational psychologist: Standard     Home Equipment: Environmental consultant - 2 wheels;Wheelchair - manual;Cane - single point;Shower seat          Prior Functioning/Environment Level  of Independence: Needs assistance  Gait / Transfers Assistance Needed: Pt using RW for past week with encouragement of family.  ADL's / Homemaking Assistance Needed: Assisted by family due to cognition   Comments: daughter reports sedentary lifestyle        OT Problem List: Decreased activity tolerance;Impaired balance (sitting and/or standing)          AM-PAC OT "6 Clicks" Daily Activity     Outcome Measure Help from another person eating meals?: None Help from another person taking care of personal grooming?: A Little Help from another person toileting, which includes using toliet, bedpan, or urinal?: A Little Help from another person bathing (including washing, rinsing, drying)?: A Lot Help from another person to put on and taking off regular upper body clothing?: A Little Help from another person to put on and taking off regular lower body clothing?: A Lot 6 Click Score: 17   End of Session Equipment Utilized During Treatment: Gait belt;Rolling walker  Activity Tolerance: Patient tolerated treatment well Patient left: in chair;with call bell/phone within reach;with family/visitor present  OT Visit Diagnosis: History of falling (Z91.81)                Time: 2257-5051 OT Time Calculation (min): 22 min Charges:  OT General Charges $OT Visit: 1 Visit OT Evaluation $OT Eval Low Complexity: 1 Low   Guadelupe Sabin, OTR/L  [phone] 08/21/2019, 9:01 AM

## 2019-08-21 NOTE — Progress Notes (Signed)
Stroke teaching with family and patient.  Handout given and BEFAST reviewed.  IV removed and diischarge instructions reviewed   To make follow up with Dr. Merlene Laughter.   Family to drive home

## 2019-08-21 NOTE — Progress Notes (Signed)
*  PRELIMINARY RESULTS* Echocardiogram 2D Echocardiogram has been performed.  Leavy Cella 08/21/2019, 10:48 AM

## 2019-08-21 NOTE — Clinical Social Work Note (Signed)
Patient previously active with [facility]. Referral made to [facility] [place] for [place].    Jaslin Novitski, Clydene Pugh, LCSW

## 2019-08-21 NOTE — Plan of Care (Signed)
  Problem: Acute Rehab PT Goals(only PT should resolve) Goal: Pt Will Go Supine/Side To Sit Outcome: Progressing Flowsheets (Taken 08/21/2019 1124) Pt will go Supine/Side to Sit: Independently Goal: Patient Will Transfer Sit To/From Stand Outcome: Progressing Flowsheets (Taken 08/21/2019 1124) Patient will transfer sit to/from stand: with supervision Goal: Pt Will Transfer Bed To Chair/Chair To Bed Outcome: Progressing Flowsheets (Taken 08/21/2019 1124) Pt will Transfer Bed to Chair/Chair to Bed: with supervision Goal: Pt Will Ambulate Outcome: Progressing Flowsheets (Taken 08/21/2019 1124) Pt will Ambulate:  100 feet  with rolling walker  with supervision   11:25 AM, 08/21/19 Lonell Grandchild, MPT Physical Therapist with [facility] [phone] office [phone] mobile phone

## 2019-08-28 DIAGNOSIS — I6389 Other cerebral infarction: Secondary | ICD-10-CM | POA: Diagnosis not present

## 2019-08-28 DIAGNOSIS — R531 Weakness: Secondary | ICD-10-CM | POA: Diagnosis not present

## 2019-08-28 DIAGNOSIS — R2689 Other abnormalities of gait and mobility: Secondary | ICD-10-CM | POA: Diagnosis not present

## 2019-08-28 DIAGNOSIS — Z6838 Body mass index (BMI) 38.0-38.9, adult: Secondary | ICD-10-CM | POA: Diagnosis not present

## 2019-09-05 DIAGNOSIS — E113213 Type 2 diabetes mellitus with mild nonproliferative diabetic retinopathy with macular edema, bilateral: Secondary | ICD-10-CM | POA: Diagnosis not present

## 2019-09-20 DIAGNOSIS — E039 Hypothyroidism, unspecified: Secondary | ICD-10-CM | POA: Diagnosis not present

## 2019-09-20 DIAGNOSIS — F0390 Unspecified dementia without behavioral disturbance: Secondary | ICD-10-CM | POA: Diagnosis not present

## 2019-09-20 DIAGNOSIS — E1122 Type 2 diabetes mellitus with diabetic chronic kidney disease: Secondary | ICD-10-CM | POA: Diagnosis not present

## 2019-09-20 DIAGNOSIS — N183 Chronic kidney disease, stage 3 unspecified: Secondary | ICD-10-CM | POA: Diagnosis not present

## 2019-09-20 DIAGNOSIS — I129 Hypertensive chronic kidney disease with stage 1 through stage 4 chronic kidney disease, or unspecified chronic kidney disease: Secondary | ICD-10-CM | POA: Diagnosis not present

## 2019-10-21 DIAGNOSIS — E1122 Type 2 diabetes mellitus with diabetic chronic kidney disease: Secondary | ICD-10-CM | POA: Diagnosis not present

## 2019-10-21 DIAGNOSIS — I129 Hypertensive chronic kidney disease with stage 1 through stage 4 chronic kidney disease, or unspecified chronic kidney disease: Secondary | ICD-10-CM | POA: Diagnosis not present

## 2019-10-21 DIAGNOSIS — E039 Hypothyroidism, unspecified: Secondary | ICD-10-CM | POA: Diagnosis not present

## 2019-10-21 DIAGNOSIS — N183 Chronic kidney disease, stage 3 unspecified: Secondary | ICD-10-CM | POA: Diagnosis not present

## 2019-10-21 DIAGNOSIS — F0390 Unspecified dementia without behavioral disturbance: Secondary | ICD-10-CM | POA: Diagnosis not present

## 2019-11-19 DIAGNOSIS — B029 Zoster without complications: Secondary | ICD-10-CM | POA: Diagnosis not present

## 2019-11-20 DIAGNOSIS — N183 Chronic kidney disease, stage 3 unspecified: Secondary | ICD-10-CM | POA: Diagnosis not present

## 2019-11-20 DIAGNOSIS — F0390 Unspecified dementia without behavioral disturbance: Secondary | ICD-10-CM | POA: Diagnosis not present

## 2019-11-20 DIAGNOSIS — E039 Hypothyroidism, unspecified: Secondary | ICD-10-CM | POA: Diagnosis not present

## 2019-11-20 DIAGNOSIS — I129 Hypertensive chronic kidney disease with stage 1 through stage 4 chronic kidney disease, or unspecified chronic kidney disease: Secondary | ICD-10-CM | POA: Diagnosis not present

## 2019-11-20 DIAGNOSIS — E1122 Type 2 diabetes mellitus with diabetic chronic kidney disease: Secondary | ICD-10-CM | POA: Diagnosis not present

## 2019-12-20 DIAGNOSIS — E039 Hypothyroidism, unspecified: Secondary | ICD-10-CM | POA: Diagnosis not present

## 2019-12-20 DIAGNOSIS — I129 Hypertensive chronic kidney disease with stage 1 through stage 4 chronic kidney disease, or unspecified chronic kidney disease: Secondary | ICD-10-CM | POA: Diagnosis not present

## 2019-12-20 DIAGNOSIS — E1122 Type 2 diabetes mellitus with diabetic chronic kidney disease: Secondary | ICD-10-CM | POA: Diagnosis not present

## 2019-12-20 DIAGNOSIS — N184 Chronic kidney disease, stage 4 (severe): Secondary | ICD-10-CM | POA: Diagnosis not present

## 2019-12-30 DIAGNOSIS — I10 Essential (primary) hypertension: Secondary | ICD-10-CM | POA: Diagnosis not present

## 2019-12-30 DIAGNOSIS — E7849 Other hyperlipidemia: Secondary | ICD-10-CM | POA: Diagnosis not present

## 2019-12-30 DIAGNOSIS — F0390 Unspecified dementia without behavioral disturbance: Secondary | ICD-10-CM | POA: Diagnosis not present

## 2019-12-30 DIAGNOSIS — E1129 Type 2 diabetes mellitus with other diabetic kidney complication: Secondary | ICD-10-CM | POA: Diagnosis not present

## 2020-01-01 DIAGNOSIS — E7849 Other hyperlipidemia: Secondary | ICD-10-CM | POA: Diagnosis not present

## 2020-01-01 DIAGNOSIS — I10 Essential (primary) hypertension: Secondary | ICD-10-CM | POA: Diagnosis not present

## 2020-01-01 DIAGNOSIS — F0390 Unspecified dementia without behavioral disturbance: Secondary | ICD-10-CM | POA: Diagnosis not present

## 2020-01-01 DIAGNOSIS — E1129 Type 2 diabetes mellitus with other diabetic kidney complication: Secondary | ICD-10-CM | POA: Diagnosis not present

## 2020-01-03 DIAGNOSIS — E7849 Other hyperlipidemia: Secondary | ICD-10-CM | POA: Diagnosis not present

## 2020-01-03 DIAGNOSIS — I10 Essential (primary) hypertension: Secondary | ICD-10-CM | POA: Diagnosis not present

## 2020-01-03 DIAGNOSIS — E1129 Type 2 diabetes mellitus with other diabetic kidney complication: Secondary | ICD-10-CM | POA: Diagnosis not present

## 2020-01-03 DIAGNOSIS — F0390 Unspecified dementia without behavioral disturbance: Secondary | ICD-10-CM | POA: Diagnosis not present

## 2020-01-06 DIAGNOSIS — E119 Type 2 diabetes mellitus without complications: Secondary | ICD-10-CM | POA: Diagnosis not present

## 2020-01-06 DIAGNOSIS — Z6837 Body mass index (BMI) 37.0-37.9, adult: Secondary | ICD-10-CM | POA: Diagnosis not present

## 2020-01-06 DIAGNOSIS — Z1389 Encounter for screening for other disorder: Secondary | ICD-10-CM | POA: Diagnosis not present

## 2020-01-06 DIAGNOSIS — Z0001 Encounter for general adult medical examination with abnormal findings: Secondary | ICD-10-CM | POA: Diagnosis not present

## 2020-01-06 DIAGNOSIS — K529 Noninfective gastroenteritis and colitis, unspecified: Secondary | ICD-10-CM | POA: Diagnosis not present

## 2020-01-07 DIAGNOSIS — E1129 Type 2 diabetes mellitus with other diabetic kidney complication: Secondary | ICD-10-CM | POA: Diagnosis not present

## 2020-01-07 DIAGNOSIS — F0390 Unspecified dementia without behavioral disturbance: Secondary | ICD-10-CM | POA: Diagnosis not present

## 2020-01-07 DIAGNOSIS — I10 Essential (primary) hypertension: Secondary | ICD-10-CM | POA: Diagnosis not present

## 2020-01-07 DIAGNOSIS — E7849 Other hyperlipidemia: Secondary | ICD-10-CM | POA: Diagnosis not present

## 2020-01-08 DIAGNOSIS — E7849 Other hyperlipidemia: Secondary | ICD-10-CM | POA: Diagnosis not present

## 2020-01-08 DIAGNOSIS — F0390 Unspecified dementia without behavioral disturbance: Secondary | ICD-10-CM | POA: Diagnosis not present

## 2020-01-08 DIAGNOSIS — E1129 Type 2 diabetes mellitus with other diabetic kidney complication: Secondary | ICD-10-CM | POA: Diagnosis not present

## 2020-01-08 DIAGNOSIS — I10 Essential (primary) hypertension: Secondary | ICD-10-CM | POA: Diagnosis not present

## 2020-01-09 DIAGNOSIS — E1129 Type 2 diabetes mellitus with other diabetic kidney complication: Secondary | ICD-10-CM | POA: Diagnosis not present

## 2020-01-09 DIAGNOSIS — E7849 Other hyperlipidemia: Secondary | ICD-10-CM | POA: Diagnosis not present

## 2020-01-09 DIAGNOSIS — F0390 Unspecified dementia without behavioral disturbance: Secondary | ICD-10-CM | POA: Diagnosis not present

## 2020-01-09 DIAGNOSIS — I10 Essential (primary) hypertension: Secondary | ICD-10-CM | POA: Diagnosis not present

## 2020-01-14 DIAGNOSIS — F0390 Unspecified dementia without behavioral disturbance: Secondary | ICD-10-CM | POA: Diagnosis not present

## 2020-01-14 DIAGNOSIS — E7849 Other hyperlipidemia: Secondary | ICD-10-CM | POA: Diagnosis not present

## 2020-01-14 DIAGNOSIS — E1129 Type 2 diabetes mellitus with other diabetic kidney complication: Secondary | ICD-10-CM | POA: Diagnosis not present

## 2020-01-14 DIAGNOSIS — I10 Essential (primary) hypertension: Secondary | ICD-10-CM | POA: Diagnosis not present

## 2020-01-16 DIAGNOSIS — E7849 Other hyperlipidemia: Secondary | ICD-10-CM | POA: Diagnosis not present

## 2020-01-16 DIAGNOSIS — F0390 Unspecified dementia without behavioral disturbance: Secondary | ICD-10-CM | POA: Diagnosis not present

## 2020-01-16 DIAGNOSIS — E1129 Type 2 diabetes mellitus with other diabetic kidney complication: Secondary | ICD-10-CM | POA: Diagnosis not present

## 2020-01-16 DIAGNOSIS — I10 Essential (primary) hypertension: Secondary | ICD-10-CM | POA: Diagnosis not present

## 2020-01-20 DIAGNOSIS — E7849 Other hyperlipidemia: Secondary | ICD-10-CM | POA: Diagnosis not present

## 2020-01-20 DIAGNOSIS — I10 Essential (primary) hypertension: Secondary | ICD-10-CM | POA: Diagnosis not present

## 2020-01-20 DIAGNOSIS — F0390 Unspecified dementia without behavioral disturbance: Secondary | ICD-10-CM | POA: Diagnosis not present

## 2020-01-20 DIAGNOSIS — E1129 Type 2 diabetes mellitus with other diabetic kidney complication: Secondary | ICD-10-CM | POA: Diagnosis not present

## 2020-01-21 DIAGNOSIS — I129 Hypertensive chronic kidney disease with stage 1 through stage 4 chronic kidney disease, or unspecified chronic kidney disease: Secondary | ICD-10-CM | POA: Diagnosis not present

## 2020-01-21 DIAGNOSIS — E039 Hypothyroidism, unspecified: Secondary | ICD-10-CM | POA: Diagnosis not present

## 2020-01-21 DIAGNOSIS — E1122 Type 2 diabetes mellitus with diabetic chronic kidney disease: Secondary | ICD-10-CM | POA: Diagnosis not present

## 2020-01-21 DIAGNOSIS — N184 Chronic kidney disease, stage 4 (severe): Secondary | ICD-10-CM | POA: Diagnosis not present

## 2020-01-22 DIAGNOSIS — I10 Essential (primary) hypertension: Secondary | ICD-10-CM | POA: Diagnosis not present

## 2020-01-22 DIAGNOSIS — E7849 Other hyperlipidemia: Secondary | ICD-10-CM | POA: Diagnosis not present

## 2020-01-22 DIAGNOSIS — F0390 Unspecified dementia without behavioral disturbance: Secondary | ICD-10-CM | POA: Diagnosis not present

## 2020-01-22 DIAGNOSIS — E1129 Type 2 diabetes mellitus with other diabetic kidney complication: Secondary | ICD-10-CM | POA: Diagnosis not present

## 2020-02-20 DIAGNOSIS — F0390 Unspecified dementia without behavioral disturbance: Secondary | ICD-10-CM | POA: Diagnosis not present

## 2020-02-20 DIAGNOSIS — E039 Hypothyroidism, unspecified: Secondary | ICD-10-CM | POA: Diagnosis not present

## 2020-02-20 DIAGNOSIS — I129 Hypertensive chronic kidney disease with stage 1 through stage 4 chronic kidney disease, or unspecified chronic kidney disease: Secondary | ICD-10-CM | POA: Diagnosis not present

## 2020-02-20 DIAGNOSIS — E1122 Type 2 diabetes mellitus with diabetic chronic kidney disease: Secondary | ICD-10-CM | POA: Diagnosis not present

## 2020-03-21 DIAGNOSIS — I129 Hypertensive chronic kidney disease with stage 1 through stage 4 chronic kidney disease, or unspecified chronic kidney disease: Secondary | ICD-10-CM | POA: Diagnosis not present

## 2020-03-21 DIAGNOSIS — F0390 Unspecified dementia without behavioral disturbance: Secondary | ICD-10-CM | POA: Diagnosis not present

## 2020-03-21 DIAGNOSIS — E039 Hypothyroidism, unspecified: Secondary | ICD-10-CM | POA: Diagnosis not present

## 2020-03-21 DIAGNOSIS — E1122 Type 2 diabetes mellitus with diabetic chronic kidney disease: Secondary | ICD-10-CM | POA: Diagnosis not present

## 2020-04-21 DIAGNOSIS — I129 Hypertensive chronic kidney disease with stage 1 through stage 4 chronic kidney disease, or unspecified chronic kidney disease: Secondary | ICD-10-CM | POA: Diagnosis not present

## 2020-04-21 DIAGNOSIS — E039 Hypothyroidism, unspecified: Secondary | ICD-10-CM | POA: Diagnosis not present

## 2020-04-21 DIAGNOSIS — E1122 Type 2 diabetes mellitus with diabetic chronic kidney disease: Secondary | ICD-10-CM | POA: Diagnosis not present

## 2020-04-21 DIAGNOSIS — F0390 Unspecified dementia without behavioral disturbance: Secondary | ICD-10-CM | POA: Diagnosis not present

## 2020-05-22 DIAGNOSIS — F0390 Unspecified dementia without behavioral disturbance: Secondary | ICD-10-CM | POA: Diagnosis not present

## 2020-05-22 DIAGNOSIS — I639 Cerebral infarction, unspecified: Secondary | ICD-10-CM | POA: Diagnosis not present

## 2020-05-22 DIAGNOSIS — E1122 Type 2 diabetes mellitus with diabetic chronic kidney disease: Secondary | ICD-10-CM | POA: Diagnosis not present

## 2020-05-22 DIAGNOSIS — E039 Hypothyroidism, unspecified: Secondary | ICD-10-CM | POA: Diagnosis not present

## 2020-06-20 DIAGNOSIS — F0390 Unspecified dementia without behavioral disturbance: Secondary | ICD-10-CM | POA: Diagnosis not present

## 2020-06-20 DIAGNOSIS — I129 Hypertensive chronic kidney disease with stage 1 through stage 4 chronic kidney disease, or unspecified chronic kidney disease: Secondary | ICD-10-CM | POA: Diagnosis not present

## 2020-06-20 DIAGNOSIS — E1122 Type 2 diabetes mellitus with diabetic chronic kidney disease: Secondary | ICD-10-CM | POA: Diagnosis not present

## 2020-06-20 DIAGNOSIS — E039 Hypothyroidism, unspecified: Secondary | ICD-10-CM | POA: Diagnosis not present

## 2020-07-09 DIAGNOSIS — N184 Chronic kidney disease, stage 4 (severe): Secondary | ICD-10-CM | POA: Diagnosis not present

## 2020-07-09 DIAGNOSIS — Z0001 Encounter for general adult medical examination with abnormal findings: Secondary | ICD-10-CM | POA: Diagnosis not present

## 2020-07-09 DIAGNOSIS — E1129 Type 2 diabetes mellitus with other diabetic kidney complication: Secondary | ICD-10-CM | POA: Diagnosis not present

## 2020-07-09 DIAGNOSIS — Z6838 Body mass index (BMI) 38.0-38.9, adult: Secondary | ICD-10-CM | POA: Diagnosis not present

## 2020-07-09 DIAGNOSIS — Z1331 Encounter for screening for depression: Secondary | ICD-10-CM | POA: Diagnosis not present

## 2020-07-09 DIAGNOSIS — Z0000 Encounter for general adult medical examination without abnormal findings: Secondary | ICD-10-CM | POA: Diagnosis not present

## 2020-07-09 DIAGNOSIS — E7849 Other hyperlipidemia: Secondary | ICD-10-CM | POA: Diagnosis not present

## 2020-07-09 DIAGNOSIS — D649 Anemia, unspecified: Secondary | ICD-10-CM | POA: Diagnosis not present

## 2020-07-14 DIAGNOSIS — I509 Heart failure, unspecified: Secondary | ICD-10-CM | POA: Diagnosis not present

## 2020-07-14 DIAGNOSIS — I130 Hypertensive heart and chronic kidney disease with heart failure and stage 1 through stage 4 chronic kidney disease, or unspecified chronic kidney disease: Secondary | ICD-10-CM | POA: Diagnosis not present

## 2020-07-14 DIAGNOSIS — E1165 Type 2 diabetes mellitus with hyperglycemia: Secondary | ICD-10-CM | POA: Diagnosis not present

## 2020-07-14 DIAGNOSIS — E1159 Type 2 diabetes mellitus with other circulatory complications: Secondary | ICD-10-CM | POA: Diagnosis not present

## 2020-07-14 DIAGNOSIS — Z6841 Body Mass Index (BMI) 40.0 and over, adult: Secondary | ICD-10-CM | POA: Diagnosis not present

## 2020-07-14 DIAGNOSIS — Z634 Disappearance and death of family member: Secondary | ICD-10-CM | POA: Diagnosis not present

## 2020-07-14 DIAGNOSIS — E1122 Type 2 diabetes mellitus with diabetic chronic kidney disease: Secondary | ICD-10-CM | POA: Diagnosis not present

## 2020-07-14 DIAGNOSIS — E039 Hypothyroidism, unspecified: Secondary | ICD-10-CM | POA: Diagnosis not present

## 2020-07-14 DIAGNOSIS — F339 Major depressive disorder, recurrent, unspecified: Secondary | ICD-10-CM | POA: Diagnosis not present

## 2020-07-14 DIAGNOSIS — N184 Chronic kidney disease, stage 4 (severe): Secondary | ICD-10-CM | POA: Diagnosis not present

## 2020-07-14 DIAGNOSIS — F0390 Unspecified dementia without behavioral disturbance: Secondary | ICD-10-CM | POA: Diagnosis not present

## 2020-07-20 DIAGNOSIS — E039 Hypothyroidism, unspecified: Secondary | ICD-10-CM | POA: Diagnosis not present

## 2020-07-20 DIAGNOSIS — E1122 Type 2 diabetes mellitus with diabetic chronic kidney disease: Secondary | ICD-10-CM | POA: Diagnosis not present

## 2020-07-20 DIAGNOSIS — F0390 Unspecified dementia without behavioral disturbance: Secondary | ICD-10-CM | POA: Diagnosis not present

## 2020-07-20 DIAGNOSIS — I129 Hypertensive chronic kidney disease with stage 1 through stage 4 chronic kidney disease, or unspecified chronic kidney disease: Secondary | ICD-10-CM | POA: Diagnosis not present

## 2020-07-20 DIAGNOSIS — N183 Chronic kidney disease, stage 3 unspecified: Secondary | ICD-10-CM | POA: Diagnosis not present

## 2020-08-06 ENCOUNTER — Other Ambulatory Visit: Payer: Self-pay | Admitting: Nephrology

## 2020-08-06 ENCOUNTER — Other Ambulatory Visit (HOSPITAL_COMMUNITY): Payer: Self-pay | Admitting: Nephrology

## 2020-08-06 DIAGNOSIS — I129 Hypertensive chronic kidney disease with stage 1 through stage 4 chronic kidney disease, or unspecified chronic kidney disease: Secondary | ICD-10-CM | POA: Diagnosis not present

## 2020-08-06 DIAGNOSIS — E039 Hypothyroidism, unspecified: Secondary | ICD-10-CM | POA: Diagnosis not present

## 2020-08-06 DIAGNOSIS — N189 Chronic kidney disease, unspecified: Secondary | ICD-10-CM | POA: Diagnosis not present

## 2020-08-06 DIAGNOSIS — E1122 Type 2 diabetes mellitus with diabetic chronic kidney disease: Secondary | ICD-10-CM | POA: Diagnosis not present

## 2020-08-06 DIAGNOSIS — E1129 Type 2 diabetes mellitus with other diabetic kidney complication: Secondary | ICD-10-CM

## 2020-08-06 DIAGNOSIS — R809 Proteinuria, unspecified: Secondary | ICD-10-CM | POA: Diagnosis not present

## 2020-08-06 DIAGNOSIS — I5032 Chronic diastolic (congestive) heart failure: Secondary | ICD-10-CM | POA: Diagnosis not present

## 2020-08-06 DIAGNOSIS — Z79899 Other long term (current) drug therapy: Secondary | ICD-10-CM | POA: Diagnosis not present

## 2020-08-07 DIAGNOSIS — E11319 Type 2 diabetes mellitus with unspecified diabetic retinopathy without macular edema: Secondary | ICD-10-CM | POA: Diagnosis not present

## 2020-08-07 DIAGNOSIS — H524 Presbyopia: Secondary | ICD-10-CM | POA: Diagnosis not present

## 2020-08-13 ENCOUNTER — Ambulatory Visit (HOSPITAL_COMMUNITY)
Admission: RE | Admit: 2020-08-13 | Discharge: 2020-08-13 | Disposition: A | Discharge status: Home / Self Care | Payer: PPO | Source: Ambulatory Visit | Attending: Nephrology | Admitting: Nephrology

## 2020-08-13 DIAGNOSIS — E1129 Type 2 diabetes mellitus with other diabetic kidney complication: Secondary | ICD-10-CM | POA: Diagnosis not present

## 2020-08-13 DIAGNOSIS — D631 Anemia in chronic kidney disease: Secondary | ICD-10-CM | POA: Diagnosis not present

## 2020-08-13 DIAGNOSIS — R809 Proteinuria, unspecified: Secondary | ICD-10-CM | POA: Diagnosis not present

## 2020-08-13 DIAGNOSIS — N189 Chronic kidney disease, unspecified: Secondary | ICD-10-CM | POA: Diagnosis not present

## 2020-08-13 DIAGNOSIS — I129 Hypertensive chronic kidney disease with stage 1 through stage 4 chronic kidney disease, or unspecified chronic kidney disease: Secondary | ICD-10-CM | POA: Insufficient documentation

## 2020-08-13 DIAGNOSIS — E1122 Type 2 diabetes mellitus with diabetic chronic kidney disease: Secondary | ICD-10-CM | POA: Diagnosis not present

## 2020-08-13 DIAGNOSIS — E039 Hypothyroidism, unspecified: Secondary | ICD-10-CM | POA: Diagnosis not present

## 2020-08-13 DIAGNOSIS — N281 Cyst of kidney, acquired: Secondary | ICD-10-CM | POA: Diagnosis not present

## 2020-08-13 DIAGNOSIS — Z79899 Other long term (current) drug therapy: Secondary | ICD-10-CM | POA: Diagnosis not present

## 2020-08-13 DIAGNOSIS — E559 Vitamin D deficiency, unspecified: Secondary | ICD-10-CM | POA: Diagnosis not present

## 2020-08-13 DIAGNOSIS — D519 Vitamin B12 deficiency anemia, unspecified: Secondary | ICD-10-CM | POA: Diagnosis not present

## 2020-08-13 DIAGNOSIS — I5032 Chronic diastolic (congestive) heart failure: Secondary | ICD-10-CM | POA: Diagnosis not present

## 2020-08-19 DIAGNOSIS — I129 Hypertensive chronic kidney disease with stage 1 through stage 4 chronic kidney disease, or unspecified chronic kidney disease: Secondary | ICD-10-CM | POA: Diagnosis not present

## 2020-08-19 DIAGNOSIS — E1122 Type 2 diabetes mellitus with diabetic chronic kidney disease: Secondary | ICD-10-CM | POA: Diagnosis not present

## 2020-08-19 DIAGNOSIS — N183 Chronic kidney disease, stage 3 unspecified: Secondary | ICD-10-CM | POA: Diagnosis not present

## 2020-09-10 DIAGNOSIS — N189 Chronic kidney disease, unspecified: Secondary | ICD-10-CM | POA: Diagnosis not present

## 2020-09-10 DIAGNOSIS — F0390 Unspecified dementia without behavioral disturbance: Secondary | ICD-10-CM | POA: Diagnosis not present

## 2020-09-10 DIAGNOSIS — R809 Proteinuria, unspecified: Secondary | ICD-10-CM | POA: Diagnosis not present

## 2020-09-10 DIAGNOSIS — E1129 Type 2 diabetes mellitus with other diabetic kidney complication: Secondary | ICD-10-CM | POA: Diagnosis not present

## 2020-09-10 DIAGNOSIS — R413 Other amnesia: Secondary | ICD-10-CM | POA: Diagnosis not present

## 2020-09-10 DIAGNOSIS — D631 Anemia in chronic kidney disease: Secondary | ICD-10-CM | POA: Diagnosis not present

## 2020-09-10 DIAGNOSIS — I5032 Chronic diastolic (congestive) heart failure: Secondary | ICD-10-CM | POA: Diagnosis not present

## 2020-09-10 DIAGNOSIS — E1122 Type 2 diabetes mellitus with diabetic chronic kidney disease: Secondary | ICD-10-CM | POA: Diagnosis not present

## 2020-09-10 DIAGNOSIS — N281 Cyst of kidney, acquired: Secondary | ICD-10-CM | POA: Diagnosis not present

## 2020-09-10 DIAGNOSIS — E211 Secondary hyperparathyroidism, not elsewhere classified: Secondary | ICD-10-CM | POA: Diagnosis not present

## 2020-09-19 DIAGNOSIS — N183 Chronic kidney disease, stage 3 unspecified: Secondary | ICD-10-CM | POA: Diagnosis not present

## 2020-09-19 DIAGNOSIS — E1122 Type 2 diabetes mellitus with diabetic chronic kidney disease: Secondary | ICD-10-CM | POA: Diagnosis not present

## 2020-09-19 DIAGNOSIS — I129 Hypertensive chronic kidney disease with stage 1 through stage 4 chronic kidney disease, or unspecified chronic kidney disease: Secondary | ICD-10-CM | POA: Diagnosis not present

## 2020-09-27 ENCOUNTER — Inpatient Hospital Stay (HOSPITAL_COMMUNITY)
Admission: EM | Admit: 2020-09-27 | Discharge: 2020-09-29 | DRG: 683 | Disposition: A | Discharge status: Skilled Nursing Facility | Payer: PPO | Attending: Internal Medicine | Admitting: Internal Medicine

## 2020-09-27 ENCOUNTER — Emergency Department (HOSPITAL_COMMUNITY): Payer: PPO

## 2020-09-27 ENCOUNTER — Encounter (HOSPITAL_COMMUNITY): Payer: Self-pay | Admitting: Emergency Medicine

## 2020-09-27 ENCOUNTER — Other Ambulatory Visit: Payer: Self-pay

## 2020-09-27 DIAGNOSIS — Z794 Long term (current) use of insulin: Secondary | ICD-10-CM | POA: Diagnosis not present

## 2020-09-27 DIAGNOSIS — R531 Weakness: Secondary | ICD-10-CM | POA: Diagnosis present

## 2020-09-27 DIAGNOSIS — E1165 Type 2 diabetes mellitus with hyperglycemia: Secondary | ICD-10-CM | POA: Diagnosis not present

## 2020-09-27 DIAGNOSIS — M1712 Unilateral primary osteoarthritis, left knee: Secondary | ICD-10-CM | POA: Diagnosis not present

## 2020-09-27 DIAGNOSIS — Z8673 Personal history of transient ischemic attack (TIA), and cerebral infarction without residual deficits: Secondary | ICD-10-CM | POA: Diagnosis not present

## 2020-09-27 DIAGNOSIS — M79671 Pain in right foot: Secondary | ICD-10-CM | POA: Diagnosis present

## 2020-09-27 DIAGNOSIS — M6281 Muscle weakness (generalized): Secondary | ICD-10-CM | POA: Diagnosis not present

## 2020-09-27 DIAGNOSIS — Z7982 Long term (current) use of aspirin: Secondary | ICD-10-CM | POA: Diagnosis not present

## 2020-09-27 DIAGNOSIS — Z823 Family history of stroke: Secondary | ICD-10-CM

## 2020-09-27 DIAGNOSIS — F0390 Unspecified dementia without behavioral disturbance: Secondary | ICD-10-CM

## 2020-09-27 DIAGNOSIS — G309 Alzheimer's disease, unspecified: Secondary | ICD-10-CM | POA: Diagnosis present

## 2020-09-27 DIAGNOSIS — R404 Transient alteration of awareness: Secondary | ICD-10-CM | POA: Diagnosis not present

## 2020-09-27 DIAGNOSIS — Z87891 Personal history of nicotine dependence: Secondary | ICD-10-CM

## 2020-09-27 DIAGNOSIS — M170 Bilateral primary osteoarthritis of knee: Secondary | ICD-10-CM | POA: Diagnosis not present

## 2020-09-27 DIAGNOSIS — E1159 Type 2 diabetes mellitus with other circulatory complications: Secondary | ICD-10-CM | POA: Diagnosis not present

## 2020-09-27 DIAGNOSIS — Z8261 Family history of arthritis: Secondary | ICD-10-CM | POA: Diagnosis not present

## 2020-09-27 DIAGNOSIS — Z8249 Family history of ischemic heart disease and other diseases of the circulatory system: Secondary | ICD-10-CM

## 2020-09-27 DIAGNOSIS — E1122 Type 2 diabetes mellitus with diabetic chronic kidney disease: Secondary | ICD-10-CM | POA: Diagnosis present

## 2020-09-27 DIAGNOSIS — N179 Acute kidney failure, unspecified: Secondary | ICD-10-CM | POA: Diagnosis not present

## 2020-09-27 DIAGNOSIS — R52 Pain, unspecified: Secondary | ICD-10-CM

## 2020-09-27 DIAGNOSIS — N1832 Chronic kidney disease, stage 3b: Secondary | ICD-10-CM | POA: Diagnosis not present

## 2020-09-27 DIAGNOSIS — R4182 Altered mental status, unspecified: Secondary | ICD-10-CM | POA: Diagnosis present

## 2020-09-27 DIAGNOSIS — E039 Hypothyroidism, unspecified: Secondary | ICD-10-CM | POA: Diagnosis not present

## 2020-09-27 DIAGNOSIS — Z20822 Contact with and (suspected) exposure to covid-19: Secondary | ICD-10-CM | POA: Diagnosis not present

## 2020-09-27 DIAGNOSIS — R410 Disorientation, unspecified: Secondary | ICD-10-CM | POA: Diagnosis not present

## 2020-09-27 DIAGNOSIS — Z79890 Hormone replacement therapy (postmenopausal): Secondary | ICD-10-CM

## 2020-09-27 DIAGNOSIS — Z79899 Other long term (current) drug therapy: Secondary | ICD-10-CM | POA: Diagnosis not present

## 2020-09-27 DIAGNOSIS — S92154A Nondisplaced avulsion fracture (chip fracture) of right talus, initial encounter for closed fracture: Secondary | ICD-10-CM | POA: Diagnosis not present

## 2020-09-27 DIAGNOSIS — E785 Hyperlipidemia, unspecified: Secondary | ICD-10-CM | POA: Diagnosis present

## 2020-09-27 DIAGNOSIS — M159 Polyosteoarthritis, unspecified: Secondary | ICD-10-CM | POA: Diagnosis not present

## 2020-09-27 DIAGNOSIS — I10 Essential (primary) hypertension: Secondary | ICD-10-CM | POA: Diagnosis not present

## 2020-09-27 DIAGNOSIS — R262 Difficulty in walking, not elsewhere classified: Secondary | ICD-10-CM | POA: Diagnosis not present

## 2020-09-27 DIAGNOSIS — M1711 Unilateral primary osteoarthritis, right knee: Secondary | ICD-10-CM | POA: Diagnosis not present

## 2020-09-27 DIAGNOSIS — M25562 Pain in left knee: Secondary | ICD-10-CM

## 2020-09-27 DIAGNOSIS — D631 Anemia in chronic kidney disease: Secondary | ICD-10-CM | POA: Diagnosis present

## 2020-09-27 DIAGNOSIS — Z66 Do not resuscitate: Secondary | ICD-10-CM | POA: Diagnosis present

## 2020-09-27 DIAGNOSIS — Z9181 History of falling: Secondary | ICD-10-CM | POA: Diagnosis not present

## 2020-09-27 DIAGNOSIS — I639 Cerebral infarction, unspecified: Secondary | ICD-10-CM | POA: Diagnosis not present

## 2020-09-27 DIAGNOSIS — I129 Hypertensive chronic kidney disease with stage 1 through stage 4 chronic kidney disease, or unspecified chronic kidney disease: Secondary | ICD-10-CM | POA: Diagnosis not present

## 2020-09-27 DIAGNOSIS — M25461 Effusion, right knee: Secondary | ICD-10-CM | POA: Diagnosis not present

## 2020-09-27 DIAGNOSIS — N184 Chronic kidney disease, stage 4 (severe): Secondary | ICD-10-CM | POA: Diagnosis not present

## 2020-09-27 DIAGNOSIS — M25462 Effusion, left knee: Secondary | ICD-10-CM | POA: Diagnosis not present

## 2020-09-27 DIAGNOSIS — R0902 Hypoxemia: Secondary | ICD-10-CM | POA: Diagnosis not present

## 2020-09-27 DIAGNOSIS — E669 Obesity, unspecified: Secondary | ICD-10-CM | POA: Diagnosis present

## 2020-09-27 DIAGNOSIS — F0280 Dementia in other diseases classified elsewhere without behavioral disturbance: Secondary | ICD-10-CM | POA: Diagnosis not present

## 2020-09-27 DIAGNOSIS — Z6841 Body Mass Index (BMI) 40.0 and over, adult: Secondary | ICD-10-CM

## 2020-09-27 DIAGNOSIS — G8929 Other chronic pain: Secondary | ICD-10-CM | POA: Diagnosis present

## 2020-09-27 DIAGNOSIS — Z833 Family history of diabetes mellitus: Secondary | ICD-10-CM

## 2020-09-27 DIAGNOSIS — S92151D Displaced avulsion fracture (chip fracture) of right talus, subsequent encounter for fracture with routine healing: Secondary | ICD-10-CM | POA: Diagnosis not present

## 2020-09-27 DIAGNOSIS — M25561 Pain in right knee: Secondary | ICD-10-CM | POA: Diagnosis not present

## 2020-09-27 DIAGNOSIS — R2681 Unsteadiness on feet: Secondary | ICD-10-CM | POA: Diagnosis not present

## 2020-09-27 DIAGNOSIS — D649 Anemia, unspecified: Secondary | ICD-10-CM | POA: Diagnosis present

## 2020-09-27 LAB — RESP PANEL BY RT-PCR (FLU A&B, COVID) ARPGX2
Influenza A by PCR: NEGATIVE
Influenza B by PCR: NEGATIVE
SARS Coronavirus 2 by RT PCR: NEGATIVE

## 2020-09-27 LAB — COMPREHENSIVE METABOLIC PANEL
ALT: 16 U/L (ref 0–44)
AST: 19 U/L (ref 15–41)
Albumin: 3.5 g/dL (ref 3.5–5.0)
Alkaline Phosphatase: 46 U/L (ref 38–126)
Anion gap: 9 (ref 5–15)
BUN: 56 mg/dL — ABNORMAL HIGH (ref 8–23)
CO2: 25 mmol/L (ref 22–32)
Calcium: 8.8 mg/dL — ABNORMAL LOW (ref 8.9–10.3)
Chloride: 103 mmol/L (ref 98–111)
Creatinine, Ser: 2.49 mg/dL — ABNORMAL HIGH (ref 0.44–1.00)
GFR, Estimated: 20 mL/min — ABNORMAL LOW (ref >60)
Glucose, Bld: 177 mg/dL — ABNORMAL HIGH (ref 70–99)
Potassium: 4.2 mmol/L (ref 3.5–5.1)
Sodium: 137 mmol/L (ref 135–145)
Total Bilirubin: 0.6 mg/dL (ref 0.3–1.2)
Total Protein: 6.7 g/dL (ref 6.5–8.1)

## 2020-09-27 LAB — GLUCOSE, CAPILLARY
Glucose-Capillary: 111 mg/dL — ABNORMAL HIGH (ref 70–99)
Glucose-Capillary: 128 mg/dL — ABNORMAL HIGH (ref 70–99)

## 2020-09-27 LAB — URINALYSIS, ROUTINE W REFLEX MICROSCOPIC
Bacteria, UA: NONE SEEN
Bilirubin Urine: NEGATIVE
Glucose, UA: NEGATIVE mg/dL
Ketones, ur: NEGATIVE mg/dL
Leukocytes,Ua: NEGATIVE
Nitrite: NEGATIVE
Protein, ur: NEGATIVE mg/dL
Specific Gravity, Urine: 1.009 (ref 1.005–1.030)
pH: 5 (ref 5.0–8.0)

## 2020-09-27 LAB — CBC WITH DIFFERENTIAL/PLATELET
Abs Immature Granulocytes: 0.04 10*3/uL (ref 0.00–0.07)
Basophils Absolute: 0.1 10*3/uL (ref 0.0–0.1)
Basophils Relative: 1 %
Eosinophils Absolute: 0.1 10*3/uL (ref 0.0–0.5)
Eosinophils Relative: 1 %
HCT: 32.1 % — ABNORMAL LOW (ref 36.0–46.0)
Hemoglobin: 10.5 g/dL — ABNORMAL LOW (ref 12.0–15.0)
Immature Granulocytes: 0 %
Lymphocytes Relative: 18 %
Lymphs Abs: 1.6 10*3/uL (ref 0.7–4.0)
MCH: 30.7 pg (ref 26.0–34.0)
MCHC: 32.7 g/dL (ref 30.0–36.0)
MCV: 93.9 fL (ref 80.0–100.0)
Monocytes Absolute: 0.8 10*3/uL (ref 0.1–1.0)
Monocytes Relative: 8 %
Neutro Abs: 6.5 10*3/uL (ref 1.7–7.7)
Neutrophils Relative %: 72 %
Platelets: 164 10*3/uL (ref 150–400)
RBC: 3.42 MIL/uL — ABNORMAL LOW (ref 3.87–5.11)
RDW: 13.9 % (ref 11.5–15.5)
WBC: 9.1 10*3/uL (ref 4.0–10.5)
nRBC: 0 % (ref 0.0–0.2)

## 2020-09-27 LAB — CBC
HCT: 28.5 % — ABNORMAL LOW (ref 36.0–46.0)
Hemoglobin: 9.2 g/dL — ABNORMAL LOW (ref 12.0–15.0)
MCH: 30.4 pg (ref 26.0–34.0)
MCHC: 32.3 g/dL (ref 30.0–36.0)
MCV: 94.1 fL (ref 80.0–100.0)
Platelets: 134 10*3/uL — ABNORMAL LOW (ref 150–400)
RBC: 3.03 MIL/uL — ABNORMAL LOW (ref 3.87–5.11)
RDW: 13.7 % (ref 11.5–15.5)
WBC: 8.3 10*3/uL (ref 4.0–10.5)
nRBC: 0 % (ref 0.0–0.2)

## 2020-09-27 LAB — HEMOGLOBIN A1C
Hgb A1c MFr Bld: 7.6 % — ABNORMAL HIGH (ref 4.8–5.6)
Mean Plasma Glucose: 171.42 mg/dL

## 2020-09-27 LAB — TSH: TSH: 1.189 u[IU]/mL (ref 0.350–4.500)

## 2020-09-27 LAB — CREATININE, SERUM
Creatinine, Ser: 2.34 mg/dL — ABNORMAL HIGH (ref 0.44–1.00)
GFR, Estimated: 21 mL/min — ABNORMAL LOW (ref >60)

## 2020-09-27 MED ORDER — OMEGA-3-ACID ETHYL ESTERS 1 G PO CAPS
1.0000 g | ORAL_CAPSULE | Freq: Every day | ORAL | Status: DC
  Administered 2020-09-28 – 2020-09-29 (×2): 1 g via ORAL
  Filled 2020-09-27 (×2): qty 1

## 2020-09-27 MED ORDER — SODIUM CHLORIDE 0.9 % IV SOLN: INTRAVENOUS | Status: AC

## 2020-09-27 MED ORDER — DONEPEZIL HCL 5 MG PO TABS
10.0000 mg | ORAL_TABLET | Freq: Every day | ORAL | Status: DC
  Administered 2020-09-27 – 2020-09-28 (×2): 10 mg via ORAL
  Filled 2020-09-27 (×2): qty 2

## 2020-09-27 MED ORDER — INSULIN ASPART 100 UNIT/ML IJ SOLN
0.0000 [IU] | Freq: Three times a day (TID) | INTRAMUSCULAR | Status: DC
  Administered 2020-09-28 – 2020-09-29 (×3): 3 [IU] via SUBCUTANEOUS

## 2020-09-27 MED ORDER — ONDANSETRON HCL 4 MG PO TABS: 4.0000 mg | ORAL_TABLET | Freq: Four times a day (QID) | ORAL | Status: DC | PRN

## 2020-09-27 MED ORDER — ACETAMINOPHEN 325 MG PO TABS: 650.0000 mg | ORAL_TABLET | Freq: Four times a day (QID) | ORAL | Status: DC | PRN

## 2020-09-27 MED ORDER — FERROUS SULFATE 325 (65 FE) MG PO TABS
325.0000 mg | ORAL_TABLET | Freq: Every day | ORAL | Status: DC
  Administered 2020-09-28 – 2020-09-29 (×2): 325 mg via ORAL
  Filled 2020-09-27 (×2): qty 1

## 2020-09-27 MED ORDER — ONDANSETRON HCL 4 MG/2ML IJ SOLN: 4.0000 mg | Freq: Four times a day (QID) | INTRAMUSCULAR | Status: DC | PRN

## 2020-09-27 MED ORDER — ACETAMINOPHEN 650 MG RE SUPP: 650.0000 mg | Freq: Four times a day (QID) | RECTAL | Status: DC | PRN

## 2020-09-27 MED ORDER — AMLODIPINE BESYLATE 5 MG PO TABS
5.0000 mg | ORAL_TABLET | Freq: Every day | ORAL | Status: DC
  Administered 2020-09-28 – 2020-09-29 (×2): 5 mg via ORAL
  Filled 2020-09-27 (×3): qty 1

## 2020-09-27 MED ORDER — SERTRALINE HCL 50 MG PO TABS
100.0000 mg | ORAL_TABLET | Freq: Every morning | ORAL | Status: DC
  Administered 2020-09-28 – 2020-09-29 (×2): 100 mg via ORAL
  Filled 2020-09-27 (×2): qty 2

## 2020-09-27 MED ORDER — LEVOTHYROXINE SODIUM 75 MCG PO TABS
150.0000 ug | ORAL_TABLET | Freq: Every morning | ORAL | Status: DC
  Administered 2020-09-29: 150 ug via ORAL
  Filled 2020-09-27: qty 2

## 2020-09-27 MED ORDER — ATORVASTATIN CALCIUM 40 MG PO TABS
40.0000 mg | ORAL_TABLET | Freq: Every morning | ORAL | Status: DC
  Administered 2020-09-28 – 2020-09-29 (×2): 40 mg via ORAL
  Filled 2020-09-27 (×2): qty 1

## 2020-09-27 MED ORDER — QUETIAPINE FUMARATE 25 MG PO TABS
25.0000 mg | ORAL_TABLET | Freq: Every day | ORAL | Status: DC
  Administered 2020-09-27 – 2020-09-28 (×2): 25 mg via ORAL
  Filled 2020-09-27 (×2): qty 1

## 2020-09-27 MED ORDER — ASPIRIN EC 325 MG PO TBEC
325.0000 mg | DELAYED_RELEASE_TABLET | Freq: Every day | ORAL | Status: DC
  Administered 2020-09-27 – 2020-09-29 (×3): 325 mg via ORAL
  Filled 2020-09-27 (×3): qty 1

## 2020-09-27 MED ORDER — SODIUM CHLORIDE 0.9 % IV SOLN: INTRAVENOUS | Status: DC

## 2020-09-27 MED ORDER — INSULIN ASPART 100 UNIT/ML IJ SOLN
4.0000 [IU] | Freq: Three times a day (TID) | INTRAMUSCULAR | Status: DC
  Administered 2020-09-27 – 2020-09-29 (×4): 4 [IU] via SUBCUTANEOUS

## 2020-09-27 MED ORDER — HEPARIN SODIUM (PORCINE) 5000 UNIT/ML IJ SOLN
5000.0000 [IU] | Freq: Three times a day (TID) | INTRAMUSCULAR | Status: DC
  Administered 2020-09-27 – 2020-09-29 (×6): 5000 [IU] via SUBCUTANEOUS
  Filled 2020-09-27 (×6): qty 1

## 2020-09-27 MED ORDER — LABETALOL HCL 200 MG PO TABS
400.0000 mg | ORAL_TABLET | Freq: Two times a day (BID) | ORAL | Status: DC
  Administered 2020-09-27 – 2020-09-29 (×4): 400 mg via ORAL
  Filled 2020-09-27 (×4): qty 2

## 2020-09-27 MED ORDER — INSULIN ASPART 100 UNIT/ML IJ SOLN: 0.0000 [IU] | Freq: Every day | INTRAMUSCULAR | Status: DC

## 2020-09-27 MED ORDER — OMEGA 3 1000 MG PO CAPS: 1.0000 | ORAL_CAPSULE | Freq: Every day | ORAL | Status: DC

## 2020-09-27 NOTE — ED Triage Notes (Signed)
Pt to the ED via [facility] with stage 6 dementia from home.  Family stated she was more confused today than normal.  Pt's family states the last time she was at baseline was Wednesday 09/23/20.  Family states she was able to walk to the bathroom unassisted until noon yesterday.  Pt has equal strength and sensation in bilateral hands arms and legs. No pronator drift.  Pt is oriented to name,place, DOB, but not time or age.

## 2020-09-27 NOTE — ED Notes (Signed)
Pt unable to ambulate. Pt able to stand with a 3 person assist. Pt unable to even take a step with maximum assistance.

## 2020-09-27 NOTE — ED Provider Notes (Signed)
[facility] EMERGENCY DEPARTMENT Provider Note   CSN: [id number] Arrival date & time: 09/27/20  1132     History Chief Complaint  Patient presents with  . Altered Mental Status    Courtney [name] is a 75 y.o. female.  Patient has a history of dementia Alzheimer's.  The patient also had a CVA in March 2021.  Patient is normally able to ambulate.  But starting about 3 days ago she will not really ambulate at all.  Also her mental status was markedly decreased and more confusion than usual the past 2 days but husband does admit that does seem to be better today.  Patient is alert and will follow all commands but she can tell there is some confusion.  Able to raise lower extremity with good strength in upper extremities with good strength.  Patient known to have fairly significant arthritic knees.  They had recommended knee replacement but she was not a good surgical candidate.  Husband is concerned because she will not ambulate very unsteady when he gets her up.  And that was somewhat concerned about the increased confusion the past 2 days but admits its better today.  No nausea no vomiting.  He feels that her p.o. intake is also been down the past few days.        Past Medical History:  Diagnosis Date  . Alzheimer's dementia ([place]) 2015  . Cancer ([place])   . Diabetes ([place])   . Hypertension     Patient Active Problem List   Diagnosis Date Noted  . CVA (cerebral vascular accident) ([place]) 08/21/2019  . Type 2 diabetes mellitus with hyperlipidemia ([place]) 08/21/2019  . DM (diabetes mellitus) ([place]) 08/20/2019  . Hypothyroidism 01/23/2019  . HTN (hypertension) 01/23/2019  . Hypothermia 01/22/2019  . Osteoarthritis of left knee 01/23/2013    Past Surgical History:  Procedure Laterality Date  . ABDOMINAL HYSTERECTOMY    . COLONOSCOPY N/A 05/14/2015   Procedure: COLONOSCOPY;  Surgeon: Rogene Houston, MD;  Location: [facility];  Service: Endoscopy;  Laterality: N/A;  730  . OOPHORECTOMY        OB History   No obstetric history on file.     Family History  Problem Relation Age of Onset  . Rheum arthritis Mother   . Heart failure Mother   . Hypertension Mother   . Stroke Father   . Diabetes Brother   . Diabetes Sister   . Hypertension Maternal Grandmother     Social History   Tobacco Use  . Smoking status: Former Smoker    Packs/day: 0.50    Years: 10.00    Pack years: 5.00    Types: Cigarettes    Quit date: 05/22/2004    Years since quitting: 16.3  . Smokeless tobacco: Never Used  Vaping Use  . Vaping Use: Never used  Substance Use Topics  . Alcohol use: Yes    Alcohol/week: 0.0 standard drinks    Comment: social  . Drug use: No    Home Medications Prior to Admission medications   Medication Sig Start Date End Date Taking? Authorizing Provider  acetaminophen (TYLENOL) 325 MG tablet Take 2 tablets (650 mg total) by mouth every 6 (six) hours as needed for mild pain, fever or headache (or Fever >/= 101). 01/24/19   Emokpae, Courage, MD  amLODipine (NORVASC) 5 MG tablet Take 5 mg by mouth daily.     [provider]  aspirin EC 325 MG tablet Take 1 tablet (325 mg  total) by mouth daily. 08/21/19   Kathie Dike, MD  atorvastatin (LIPITOR) 40 MG tablet Take 1 tablet (40 mg total) by mouth every morning. 08/21/19   Kathie Dike, MD  donepezil (ARICEPT) 10 MG tablet Take 10 mg by mouth at bedtime.  03/20/17   [provider]  ferrous sulfate 325 (65 FE) MG tablet Take 325 mg by mouth daily with breakfast.    [provider]  furosemide (LASIX) 20 MG tablet Take 40 mg by mouth every morning.     [provider]  labetalol (NORMODYNE) 200 MG tablet Take 400 mg by mouth 2 (two) times daily.    [provider]  LANTUS SOLOSTAR 100 UNIT/ML Solostar Pen Inject 40 Units into the skin at bedtime. Patient taking differently: Inject 50 Units into the skin at bedtime.  01/24/19   Roxan Hockey, MD  levothyroxine  (SYNTHROID) 150 MCG tablet Take 150 mcg by mouth every morning. 08/14/19   [provider]  lisinopril (ZESTRIL) 5 MG tablet Take 5 mg by mouth daily.    [provider]  Omega 3 1000 MG CAPS Take 1 capsule by mouth daily.    [provider]  QUEtiapine (SEROQUEL) 25 MG tablet Take 25 mg by mouth every morning.  03/07/19   [provider]  sertraline (ZOLOFT) 100 MG tablet Take 100 mg by mouth every morning.  04/20/17   [provider]    Allergies    Patient has no known allergies.  Review of Systems   Review of Systems  Unable to perform ROS: Dementia    Physical Exam Updated Vital Signs BP (!) 120/53   Pulse 62   Temp 98.4 F (36.9 C) (Oral)   Resp (!) 26   Ht 1.549 m (5\' 1" )   Wt 96.6 kg   SpO2 94%   BMI 40.25 kg/m   Physical Exam Vitals and nursing note reviewed.  Constitutional:      General: She is not in acute distress.    Appearance: Normal appearance. She is well-developed.  HENT:     Head: Normocephalic and atraumatic.     Mouth/Throat:     Mouth: Mucous membranes are dry.  Eyes:     Extraocular Movements: Extraocular movements intact.     Conjunctiva/sclera: Conjunctivae normal.     Pupils: Pupils are equal, round, and reactive to light.  Cardiovascular:     Rate and Rhythm: Normal rate and regular rhythm.     Heart sounds: No murmur heard.   Pulmonary:     Effort: Pulmonary effort is normal. No respiratory distress.     Breath sounds: Normal breath sounds.  Abdominal:     Palpations: Abdomen is soft.     Tenderness: There is no abdominal tenderness.  Musculoskeletal:        General: Swelling present. No tenderness, deformity or signs of injury. Normal range of motion.     Cervical back: Normal range of motion and neck supple.  Skin:    General: Skin is warm and dry.     Capillary Refill: Capillary refill takes less than 2 seconds.  Neurological:     General: No focal deficit present.     Mental  Status: She is alert and oriented to person, place, and time.     Cranial Nerves: No cranial nerve deficit.     Sensory: No sensory deficit.     Motor: No weakness.     ED Results / Procedures / Treatments   Labs (  all labs ordered are listed, but only abnormal results are displayed) Labs Reviewed  COMPREHENSIVE METABOLIC PANEL - Abnormal; Notable for the following components:      Result Value   Glucose, Bld 177 (*)    BUN 56 (*)    Creatinine, Ser 2.49 (*)    Calcium 8.8 (*)    GFR, Estimated 20 (*)    All other components within normal limits  CBC WITH DIFFERENTIAL/PLATELET - Abnormal; Notable for the following components:   RBC 3.42 (*)    Hemoglobin 10.5 (*)    HCT 32.1 (*)    All other components within normal limits  RESP PANEL BY RT-PCR (FLU A&B, COVID) [id number]  URINALYSIS, ROUTINE W REFLEX MICROSCOPIC    EKG EKG Interpretation  Date/Time:  Sunday Sep 27 2020 11:52:25 EDT Ventricular Rate:  61 PR Interval:  143 QRS Duration: 84 QT Interval:  441 QTC Calculation: 445 R Axis:   51 Text Interpretation: Sinus rhythm Borderline low voltage, extremity leads Confirmed by Fredia Sorrow [phone]) on 09/27/2020 12:07:32 PM   Radiology CT Head Wo Contrast  Result Date: 09/27/2020 CLINICAL DATA:  Altered mental status. EXAM: CT HEAD WITHOUT CONTRAST TECHNIQUE: Contiguous axial images were obtained from the base of the skull through the vertex without intravenous contrast. COMPARISON:  08/20/2019 FINDINGS: Brain: Stable mildly enlarged ventricles and cortical sulci. Stable mild patchy white matter low density in both cerebral hemispheres. No intracranial hemorrhage, mass lesion or CT evidence of acute infarction. Vascular: No hyperdense vessel or unexpected calcification. Skull: Mild bilateral hyperostosis frontalis. Sinuses/Orbits: Unremarkable. Other: None. IMPRESSION: No acute abnormality. Electronically Signed   By: Claudie Revering M.D.   On: 09/27/2020 12:55   DG Chest Port 1  View  Result Date: 09/27/2020 CLINICAL DATA:  Altered mental status.  History of dementia. EXAM: PORTABLE CHEST 1 VIEW COMPARISON:  Radiographs 01/22/2019.  CT 08/25/2017. FINDINGS: 1234 hours. The heart size and mediastinal contours are stable with aortic atherosclerosis and great vessel tortuosity. The lungs appear clear. There is no pleural effusion or pneumothorax. The bones appear unremarkable. Telemetry leads overlie the chest. IMPRESSION: Stable chest.  No active cardiopulmonary process. Electronically Signed   By: Richardean Sale M.D.   On: 09/27/2020 12:47   DG Knee Complete 4 Views Left  Result Date: 09/27/2020 CLINICAL DATA:  Knee pain EXAM: LEFT KNEE - COMPLETE 4+ VIEW COMPARISON:  Knee radiographs dated 01/23/2013. FINDINGS: No evidence of fracture or dislocation. There is a small to moderate knee joint effusion. Moderate tricompartmental osteoarthritis is noted. IMPRESSION: Small to moderate knee joint effusion without acute osseous injury. Chronic degenerative changes. Electronically Signed   By: Zerita Boers M.D.   On: 09/27/2020 13:28   DG Knee Complete 4 Views Right  Result Date: 09/27/2020 CLINICAL DATA:  Knee pain EXAM: RIGHT KNEE - COMPLETE 4+ VIEW COMPARISON:  None. FINDINGS: No evidence of fracture or dislocation. There is a small to moderate knee joint effusion. Moderate tricompartmental osteoarthritis is noted. IMPRESSION: Small to moderate knee joint effusion without acute osseous injury. Chronic degenerative changes. Electronically Signed   By: Zerita Boers M.D.   On: 09/27/2020 13:28    Procedures Procedures   Medications Ordered in ED Medications  0.9 %  sodium chloride infusion ( Intravenous Rate/Dose Change 09/27/20 1344)    ED Course  I have reviewed the triage vital signs and the nursing notes.  Pertinent labs & imaging results that were available during my care of the patient were reviewed by me and considered  in my medical decision making (see chart for  details).    MDM Rules/Calculators/A&P                           X-rays of both knees show some arthritic changes.  But we got patient up to ambulate she is very unsteady on her feet.  This is not just due to some knee pain.  Patient also has a history of thyroid problems a THS has been ordered.  Patient's white blood count was normal.  Hemoglobin a little low at 9.2.  Head CT without any acute findings but would consider MRI since she is had a prior history of stroke.  BUN and creatinine is markedly changed compared to 2021.  Not sure if this is new.  But certainly consistent with acute kidney injury and she seems to be dehydrated.  Have ordered some IV fluids for gentle hydration.  Discussed with hospitalist who will admit for the acute kidney injury.  The prior physical therapy evaluate lower extremity situation.  It may be consideration for MRI to completely rule out stroke.  In addition chest x-ray without any acute finding   Final Clinical Impression(s) / ED Diagnoses Final diagnoses:  Dementia without behavioral disturbance, unspecified dementia type ([place])  Weakness  AKI (acute kidney injury) ([place])  Chronic pain of both knees    Rx / DC Orders ED Discharge Orders    None       Fredia Sorrow, MD 09/27/20 1553

## 2020-09-27 NOTE — H&P (Signed)
History and Physical    [name] NIO:[id number] DOB: 1945-07-20 DOA: 09/27/2020  PCP: Cory Munch, PA-C   Patient coming from: Home  Chief Complaint: Weakness/confusion  HPI: Courtney [name] is a 75 y.o. female with medical history significant for Alzheimer's dementia with prior CVA, hypertension, dyslipidemia, type 2 diabetes, hypothyroidism, and osteoarthritis who was brought to the ED for further evaluation on account of progressive weakness as well as some intermittent worsening confusion.  She has otherwise been eating and drinking well according to the husband, but has not been urinating very much.  No other complaints of lower extremity pain, fevers, chills, or dysuria noted.   ED Course: Vital signs stable and patient is afebrile.  Hemoglobin is 10.5, BUN 56, creatinine 2.29.  Bilateral knee x-rays with small to moderate joint effusion, but no acute osseous injuries noted.  CT head negative for any acute findings and chest x-ray with no acute findings.  Urinalysis is currently pending.  Review of Systems: Reviewed as noted above, otherwise negative.  Past Medical History:  Diagnosis Date  . Alzheimer's dementia ([place]) 2015  . Cancer ([place])   . Diabetes ([place])   . Hypertension     Past Surgical History:  Procedure Laterality Date  . ABDOMINAL HYSTERECTOMY    . COLONOSCOPY N/A 05/14/2015   Procedure: COLONOSCOPY;  Surgeon: Rogene Houston, MD;  Location: [facility];  Service: Endoscopy;  Laterality: N/A;  730  . OOPHORECTOMY       reports that she quit smoking about 16 years ago. Her smoking use included cigarettes. She has a 5.00 pack-year smoking history. She has never used smokeless tobacco. She reports current alcohol use. She reports that she does not use drugs.  No Known Allergies  Family History  Problem Relation Age of Onset  . Rheum arthritis Mother   . Heart failure Mother   . Hypertension Mother   . Stroke Father   . Diabetes Brother   . Diabetes  Sister   . Hypertension Maternal Grandmother     Prior to Admission medications   Medication Sig Start Date End Date Taking? Authorizing Provider  acetaminophen (TYLENOL) 325 MG tablet Take 2 tablets (650 mg total) by mouth every 6 (six) hours as needed for mild pain, fever or headache (or Fever >/= 101). 01/24/19  Yes Emokpae, Courage, MD  amLODipine (NORVASC) 5 MG tablet Take 5 mg by mouth daily.    Yes [provider]  atorvastatin (LIPITOR) 40 MG tablet Take 1 tablet (40 mg total) by mouth every morning. 08/21/19  Yes Memon, Jolaine Artist, MD  donepezil (ARICEPT) 10 MG tablet Take 10 mg by mouth at bedtime.  03/20/17  Yes [provider]  ferrous sulfate 325 (65 FE) MG tablet Take 325 mg by mouth daily with breakfast.   Yes [provider]  furosemide (LASIX) 20 MG tablet Take 40 mg by mouth every morning.    Yes [provider]  labetalol (NORMODYNE) 200 MG tablet Take 400 mg by mouth 2 (two) times daily.   Yes [provider]  LANTUS SOLOSTAR 100 UNIT/ML Solostar Pen Inject 40 Units into the skin at bedtime. Patient taking differently: Inject 35 Units into the skin. 01/24/19  Yes Roxan Hockey, MD  levothyroxine (SYNTHROID) 150 MCG tablet Take 150 mcg by mouth every morning. 08/14/19  Yes [provider]  lisinopril (ZESTRIL) 5 MG tablet Take 5 mg by mouth daily.   Yes [provider]  Omega 3 1000 MG CAPS  Take 1 capsule by mouth daily.   Yes [provider]  QUEtiapine (SEROQUEL) 25 MG tablet Take 25 mg by mouth every morning.  03/07/19  Yes [provider]  sertraline (ZOLOFT) 100 MG tablet Take 100 mg by mouth every morning.  04/20/17  Yes [provider]  aspirin EC 325 MG tablet Take 1 tablet (325 mg total) by mouth daily. 08/21/19   Kathie Dike, MD    Physical Exam: Vitals:   09/27/20 1200 09/27/20 1230 09/27/20 1300 09/27/20 1400  BP: 135/72 115/68 (!) 120/53 (!) 143/72  Pulse: (!) 59 61 62 61   Resp: 18 (!) 21 (!) 26 14  Temp:      TempSrc:      SpO2: 97% 94% 94% 95%  Weight:      Height:        Constitutional: NAD, calm, comfortable, pleasantly confused, obese Vitals:   09/27/20 1200 09/27/20 1230 09/27/20 1300 09/27/20 1400  BP: 135/72 115/68 (!) 120/53 (!) 143/72  Pulse: (!) 59 61 62 61  Resp: 18 (!) 21 (!) 26 14  Temp:      TempSrc:      SpO2: 97% 94% 94% 95%  Weight:      Height:       Eyes: lids and conjunctivae normal Neck: normal, supple Respiratory: clear to auscultation bilaterally. Normal respiratory effort. No accessory muscle use.  Cardiovascular: Regular rate and rhythm, no murmurs. Abdomen: no tenderness, no distention. Bowel sounds positive.  Musculoskeletal:  No edema. Skin: no rashes, lesions, ulcers.  Psychiatric: Flat affect  Labs on Admission: I have personally reviewed following labs and imaging studies  CBC: Recent Labs  Lab 09/27/20 1220  WBC 9.1  NEUTROABS 6.5  HGB 10.5*  HCT 32.1*  MCV 93.9  PLT 944   Basic Metabolic Panel: Recent Labs  Lab 09/27/20 1220  NA 137  K 4.2  CL 103  CO2 25  GLUCOSE 177*  BUN 56*  CREATININE 2.49*  CALCIUM 8.8*   GFR: Estimated Creatinine Clearance: 20.7 mL/min (A) (by C-G formula based on SCr of 2.49 mg/dL (H)). Liver Function Tests: Recent Labs  Lab 09/27/20 1220  AST 19  ALT 16  ALKPHOS 46  BILITOT 0.6  PROT 6.7  ALBUMIN 3.5   No results for input(s): LIPASE, AMYLASE in the last 168 hours. No results for input(s): AMMONIA in the last 168 hours. Coagulation Profile: No results for input(s): INR, PROTIME in the last 168 hours. Cardiac Enzymes: No results for input(s): CKTOTAL, CKMB, CKMBINDEX, TROPONINI in the last 168 hours. BNP (last 3 results) No results for input(s): PROBNP in the last 8760 hours. HbA1C: No results for input(s): HGBA1C in the last 72 hours. CBG: No results for input(s): GLUCAP in the last 168 hours. Lipid Profile: No results for input(s): CHOL, HDL,  LDLCALC, TRIG, CHOLHDL, LDLDIRECT in the last 72 hours. Thyroid Function Tests: No results for input(s): TSH, T4TOTAL, FREET4, T3FREE, THYROIDAB in the last 72 hours. Anemia Panel: No results for input(s): VITAMINB12, FOLATE, FERRITIN, TIBC, IRON, RETICCTPCT in the last 72 hours. Urine analysis:    Component Value Date/Time   COLORURINE YELLOW 08/20/2019 1134   APPEARANCEUR CLEAR 08/20/2019 1134   LABSPEC 1.011 08/20/2019 1134   PHURINE 5.0 08/20/2019 1134   GLUCOSEU NEGATIVE 08/20/2019 1134   HGBUR SMALL (A) 08/20/2019 1134   BILIRUBINUR NEGATIVE 08/20/2019 1134   KETONESUR NEGATIVE 08/20/2019 1134   PROTEINUR 30 (A) 08/20/2019 1134   UROBILINOGEN 0.2 04/24/2014 1406  NITRITE NEGATIVE 08/20/2019 [place] 08/20/2019 1134    Radiological Exams on Admission: CT Head Wo Contrast  Result Date: 09/27/2020 CLINICAL DATA:  Altered mental status. EXAM: CT HEAD WITHOUT CONTRAST TECHNIQUE: Contiguous axial images were obtained from the base of the skull through the vertex without intravenous contrast. COMPARISON:  08/20/2019 FINDINGS: Brain: Stable mildly enlarged ventricles and cortical sulci. Stable mild patchy white matter low density in both cerebral hemispheres. No intracranial hemorrhage, mass lesion or CT evidence of acute infarction. Vascular: No hyperdense vessel or unexpected calcification. Skull: Mild bilateral hyperostosis frontalis. Sinuses/Orbits: Unremarkable. Other: None. IMPRESSION: No acute abnormality. Electronically Signed   By: Claudie Revering M.D.   On: 09/27/2020 12:55   DG Chest Port 1 View  Result Date: 09/27/2020 CLINICAL DATA:  Altered mental status.  History of dementia. EXAM: PORTABLE CHEST 1 VIEW COMPARISON:  Radiographs 01/22/2019.  CT 08/25/2017. FINDINGS: 1234 hours. The heart size and mediastinal contours are stable with aortic atherosclerosis and great vessel tortuosity. The lungs appear clear. There is no pleural effusion or pneumothorax. The  bones appear unremarkable. Telemetry leads overlie the chest. IMPRESSION: Stable chest.  No active cardiopulmonary process. Electronically Signed   By: Richardean Sale M.D.   On: 09/27/2020 12:47   DG Knee Complete 4 Views Left  Result Date: 09/27/2020 CLINICAL DATA:  Knee pain EXAM: LEFT KNEE - COMPLETE 4+ VIEW COMPARISON:  Knee radiographs dated 01/23/2013. FINDINGS: No evidence of fracture or dislocation. There is a small to moderate knee joint effusion. Moderate tricompartmental osteoarthritis is noted. IMPRESSION: Small to moderate knee joint effusion without acute osseous injury. Chronic degenerative changes. Electronically Signed   By: Zerita Boers M.D.   On: 09/27/2020 13:28   DG Knee Complete 4 Views Right  Result Date: 09/27/2020 CLINICAL DATA:  Knee pain EXAM: RIGHT KNEE - COMPLETE 4+ VIEW COMPARISON:  None. FINDINGS: No evidence of fracture or dislocation. There is a small to moderate knee joint effusion. Moderate tricompartmental osteoarthritis is noted. IMPRESSION: Small to moderate knee joint effusion without acute osseous injury. Chronic degenerative changes. Electronically Signed   By: Zerita Boers M.D.   On: 09/27/2020 13:28    EKG: Independently reviewed. 61bpm, SR.  Assessment/Plan Active Problems:   AKI (acute kidney injury) ([place])    AKI secondary to poor oral intake-prerenal versus progression of CKD to CKD 3b/4 -Recently seen by Dr. Theador Hawthorne 4/22, Cr was 1.78 at that time; currently 2.49 -Baseline 1.6-1.9 -Plan to use IV fluid for hydration -Follow repeat labs -Monitor intake and output -Avoid nephrotoxic agents -Further work-up pending repeat labs  Generalized weakness secondary to above versus progressive dementia -PT evaluation -Fall precautions -Brain MRI in am -UA ordered and pending -Possible need for placement/TOC consultation  Chronic anemia -No overt bleeding noted, continue to follow trend  Alzheimer's dementia -Continue  Seroquel  Hypertension-stable -Continue home medications with exception of lisinopril given AKI  Dyslipidemia -Continue statin  Hypothyroidism -Continue Synthroid -Check TSH  Type 2 diabetes -SSI -Hold Lantus for now until eating better  Obesity -Lifestyle changes outpatient   DVT prophylaxis: Heparin Code Status: DNR Family Communication: Discussed with husband at bedside Disposition Plan:Admit for treatment of AKI Consults called:None Admission status: Inpatient, MedSurg  Berish Bohman D Vallen Calabrese DO [facility]  If 7PM-7AM, please contact night-coverage [email or web address]  09/27/2020, 2:36 PM

## 2020-09-28 ENCOUNTER — Inpatient Hospital Stay (HOSPITAL_COMMUNITY): Payer: PPO

## 2020-09-28 LAB — BASIC METABOLIC PANEL
Anion gap: 6 (ref 5–15)
BUN: 48 mg/dL — ABNORMAL HIGH (ref 8–23)
CO2: 25 mmol/L (ref 22–32)
Calcium: 8.4 mg/dL — ABNORMAL LOW (ref 8.9–10.3)
Chloride: 108 mmol/L (ref 98–111)
Creatinine, Ser: 1.82 mg/dL — ABNORMAL HIGH (ref 0.44–1.00)
GFR, Estimated: 29 mL/min — ABNORMAL LOW (ref >60)
Glucose, Bld: 107 mg/dL — ABNORMAL HIGH (ref 70–99)
Potassium: 4 mmol/L (ref 3.5–5.1)
Sodium: 139 mmol/L (ref 135–145)

## 2020-09-28 LAB — GLUCOSE, CAPILLARY
Glucose-Capillary: 110 mg/dL — ABNORMAL HIGH (ref 70–99)
Glucose-Capillary: 79 mg/dL (ref 70–99)
Glucose-Capillary: 160 mg/dL — ABNORMAL HIGH (ref 70–99)
Glucose-Capillary: 90 mg/dL (ref 70–99)

## 2020-09-28 LAB — CBC
HCT: 27.8 % — ABNORMAL LOW (ref 36.0–46.0)
Hemoglobin: 9 g/dL — ABNORMAL LOW (ref 12.0–15.0)
MCH: 30.3 pg (ref 26.0–34.0)
MCHC: 32.4 g/dL (ref 30.0–36.0)
MCV: 93.6 fL (ref 80.0–100.0)
Platelets: 145 10*3/uL — ABNORMAL LOW (ref 150–400)
RBC: 2.97 MIL/uL — ABNORMAL LOW (ref 3.87–5.11)
RDW: 13.5 % (ref 11.5–15.5)
WBC: 7.7 10*3/uL (ref 4.0–10.5)
nRBC: 0 % (ref 0.0–0.2)

## 2020-09-28 LAB — VITAMIN B12: Vitamin B-12: 171 pg/mL — ABNORMAL LOW (ref 180–914)

## 2020-09-28 LAB — FOLATE: Folate: 5.3 ng/mL — ABNORMAL LOW (ref >5.9)

## 2020-09-28 LAB — MAGNESIUM: Magnesium: 1.9 mg/dL (ref 1.7–2.4)

## 2020-09-28 MED ORDER — MUSCLE RUB 10-15 % EX CREA
TOPICAL_CREAM | CUTANEOUS | Status: DC | PRN
  Filled 2020-09-28: qty 85

## 2020-09-28 NOTE — Plan of Care (Signed)
  Problem: Education: Goal: Knowledge of General Education information will improve Description: Including pain rating scale, medication(s)/side effects and non-pharmacologic comfort measures Outcome: Progressing   Problem: Clinical Measurements: Goal: Diagnostic test results will improve Outcome: Progressing   Problem: Activity: Goal: Risk for activity intolerance will decrease Outcome: Progressing   Problem: Nutrition: Goal: Adequate nutrition will be maintained Outcome: Progressing   Problem: Coping: Goal: Level of anxiety will decrease Outcome: Progressing   

## 2020-09-28 NOTE — Progress Notes (Addendum)
PROGRESS NOTE    Courtney [name]  DXA:[id number] DOB: [birth year] DOA: 09/27/2020 PCP: Cory Munch, PA-C   Brief Narrative:    Courtney [name] is a 75 y.o. female with medical history significant for Alzheimer's dementia with prior CVA, hypertension, dyslipidemia, type 2 diabetes, hypothyroidism, and osteoarthritis who was brought to the ED for further evaluation on account of progressive weakness as well as some intermittent worsening confusion. PT has evaluated pt with recommendations for inpatient rehabilitation.  Assessment & Plan:   Active Problems:   AKI (acute kidney injury) ([place])   AKI secondary to poor oral intake-prerenal versus progression of CKD to CKD 3b/4-resolved -Recently seen by Dr. Theador Hawthorne 4/22, Cr was 1.78 at that time; currently 2.49 -Baseline 1.6-1.9 -No further need for IVF -Follow repeat labs -Monitor intake and output -Avoid nephrotoxic agents  Generalized weakness secondary to above versus progressive dementia/OA -PT evaluation with recommendation for SNF -Fall precautions -Brain MRI/C spine with no acute abnormalities -UA without sign of UTI -SNF placement pending -B12 and folate pending  Chronic anemia-stable -No overt bleeding noted, continue to follow trend  Alzheimer's dementia -Continue Seroquel  Hypertension-stable -Continue home medications with exception of lisinopril given AKI  Dyslipidemia -Continue statin  Hypothyroidism -Continue Synthroid -TSH 1.189  OA -Bengay for bilateral knees  Type 2 diabetes -SSI -Resume lantus  Obesity -Lifestyle changes outpatient   DVT prophylaxis:Heparin Code Status: DNR Family Communication: Discussed with daughter and husband at bedside 5/9 Disposition Plan:  Status is: Inpatient  Remains inpatient appropriate because:Unsafe d/c plan, IV treatments appropriate due to intensity of illness or inability to take PO and Inpatient level of care appropriate due to severity of  illness   Dispo: The patient is from: Home              Anticipated d/c is to: SNF              Patient currently is not medically stable to d/c.   Difficult to place patient No  Consultants:   None  Procedures:   See below  Antimicrobials:   None   Subjective: Patient seen and evaluated today with no new acute complaints or concerns. No acute concerns or events noted overnight.  Objective: Vitals:   09/27/20 1939 09/27/20 2339 09/28/20 0336 09/28/20 0802  BP: (!) 159/73 (!) 148/61 (!) 145/75 (!) 131/97  Pulse: (!) 58 64 95 66  Resp: 16  18 18   Temp: 97.8 F (36.6 C) 98.4 F (36.9 C) 99 F (37.2 C) 98.9 F (37.2 C)  TempSrc:  Oral Oral Oral  SpO2: 97% 96% 95% 95%  Weight:      Height:        Intake/Output Summary (Last 24 hours) at 09/28/2020 1309 Last data filed at 09/28/2020 1246 Gross per 24 hour  Intake 1698.17 ml  Output 800 ml  Net 898.17 ml   Filed Weights   09/27/20 1150  Weight: 96.6 kg    Examination:  General exam: Appears calm and comfortable, obese  Respiratory system: Clear to auscultation. Respiratory effort normal. Cardiovascular system: S1 & S2 heard, RRR.  Gastrointestinal system: Abdomen is soft Central nervous system: Alert and awake Extremities: No edema Skin: No significant lesions noted Psychiatry: Flat affect.    Data Reviewed: I have personally reviewed following labs and imaging studies  CBC: Recent Labs  Lab 09/27/20 1220 09/27/20 1527 09/28/20 0507  WBC 9.1 8.3 7.7  NEUTROABS 6.5  --   --   HGB 10.5* 9.2* 9.0*  HCT 32.1* 28.5* 27.8*  MCV 93.9 94.1 93.6  PLT 164 134* 295*   Basic Metabolic Panel: Recent Labs  Lab 09/27/20 1220 09/27/20 1527 09/28/20 0507  NA 137  --  139  K 4.2  --  4.0  CL 103  --  108  CO2 25  --  25  GLUCOSE 177*  --  107*  BUN 56*  --  48*  CREATININE 2.49* 2.34* 1.82*  CALCIUM 8.8*  --  8.4*  MG  --   --  1.9   GFR: Estimated Creatinine Clearance: 28.4 mL/min (A) (by C-G  formula based on SCr of 1.82 mg/dL (H)). Liver Function Tests: Recent Labs  Lab 09/27/20 1220  AST 19  ALT 16  ALKPHOS 46  BILITOT 0.6  PROT 6.7  ALBUMIN 3.5   No results for input(s): LIPASE, AMYLASE in the last 168 hours. No results for input(s): AMMONIA in the last 168 hours. Coagulation Profile: No results for input(s): INR, PROTIME in the last 168 hours. Cardiac Enzymes: No results for input(s): CKTOTAL, CKMB, CKMBINDEX, TROPONINI in the last 168 hours. BNP (last 3 results) No results for input(s): PROBNP in the last 8760 hours. HbA1C: Recent Labs    09/27/20 1527  HGBA1C 7.6*   CBG: Recent Labs  Lab 09/27/20 1627 09/27/20 2227 09/28/20 0809 09/28/20 1146  GLUCAP 111* 128* 110* 79   Lipid Profile: No results for input(s): CHOL, HDL, LDLCALC, TRIG, CHOLHDL, LDLDIRECT in the last 72 hours. Thyroid Function Tests: Recent Labs    09/27/20 1527  TSH 1.189   Anemia Panel: No results for input(s): VITAMINB12, FOLATE, FERRITIN, TIBC, IRON, RETICCTPCT in the last 72 hours. Sepsis Labs: No results for input(s): PROCALCITON, LATICACIDVEN in the last 168 hours.  Recent Results (from the past 240 hour(s))  Resp Panel by RT-PCR (Flu A&B, Covid) Nasopharyngeal Swab     Status: None   Collection Time: 09/27/20  1:50 PM   Specimen: Nasopharyngeal Swab; Nasopharyngeal(NP) swabs in vial transport medium  Result Value Ref Range Status   SARS Coronavirus 2 by RT PCR NEGATIVE NEGATIVE Final    Comment: (NOTE) SARS-CoV-2 target nucleic acids are NOT DETECTED.  The SARS-CoV-2 RNA is generally detectable in upper respiratory specimens during the acute phase of infection. The lowest concentration of SARS-CoV-2 viral copies this assay can detect is 138 copies/mL. A negative result does not preclude SARS-Cov-2 infection and should not be used as the sole basis for treatment or other patient management decisions. A negative result may occur with  improper specimen  collection/handling, submission of specimen other than nasopharyngeal swab, presence of viral mutation(s) within the areas targeted by this assay, and inadequate number of viral copies(<138 copies/mL). A negative result must be combined with clinical observations, patient history, and epidemiological information. The expected result is Negative.  Fact Sheet for Patients:  EntrepreneurPulse.com.au  Fact Sheet for Healthcare Providers:  IncredibleEmployment.be  This test is no t yet approved or cleared by the [place] FDA and  has been authorized for detection and/or diagnosis of SARS-CoV-2 by FDA under an Emergency Use Authorization (EUA). This EUA will remain  in effect (meaning this test can be used) for the duration of the COVID-19 declaration under Section 564(b)(1) of the Act, 21 U.S.C.section 360bbb-3(b)(1), unless the authorization is terminated  or revoked sooner.       Influenza A by PCR NEGATIVE NEGATIVE Final   Influenza B by PCR NEGATIVE NEGATIVE Final    Comment: (NOTE) The Xpert Xpress SARS-CoV-2/FLU/RSV  plus assay is intended as an aid in the diagnosis of influenza from Nasopharyngeal swab specimens and should not be used as a sole basis for treatment. Nasal washings and aspirates are unacceptable for Xpert Xpress SARS-CoV-2/FLU/RSV testing.  Fact Sheet for Patients: EntrepreneurPulse.com.au  Fact Sheet for Healthcare Providers: IncredibleEmployment.be  This test is not yet approved or cleared by the [place] FDA and has been authorized for detection and/or diagnosis of SARS-CoV-2 by FDA under an Emergency Use Authorization (EUA). This EUA will remain in effect (meaning this test can be used) for the duration of the COVID-19 declaration under Section 564(b)(1) of the Act, 21 U.S.C. section 360bbb-3(b)(1), unless the authorization is terminated or revoked.  Performed at [facility], [address], [place], [place] [address]          Radiology Studies: CT Head Wo Contrast  Result Date: 09/27/2020 CLINICAL DATA:  Altered mental status. EXAM: CT HEAD WITHOUT CONTRAST TECHNIQUE: Contiguous axial images were obtained from the base of the skull through the vertex without intravenous contrast. COMPARISON:  08/20/2019 FINDINGS: Brain: Stable mildly enlarged ventricles and cortical sulci. Stable mild patchy white matter low density in both cerebral hemispheres. No intracranial hemorrhage, mass lesion or CT evidence of acute infarction. Vascular: No hyperdense vessel or unexpected calcification. Skull: Mild bilateral hyperostosis frontalis. Sinuses/Orbits: Unremarkable. Other: None. IMPRESSION: No acute abnormality. Electronically Signed   By: Claudie Revering M.D.   On: 09/27/2020 12:55   MR BRAIN WO CONTRAST  Result Date: 09/28/2020 CLINICAL DATA:  Altered mental status EXAM: MRI HEAD WITHOUT CONTRAST TECHNIQUE: Multiplanar, multiecho pulse sequences of the brain and surrounding structures were obtained without intravenous contrast. COMPARISON:  08/20/2019 FINDINGS: Motion artifact is present. Brain: There is no acute infarction or intracranial hemorrhage. There is no intracranial mass, mass effect, or edema. There is no hydrocephalus or extra-axial fluid collection. Ventricles and sulci are prominent reflecting generalized parenchymal volume loss. Chronic small vessel infarcts of the left basal ganglia. Additional patchy foci of T2 hyperintensity in the supratentorial white matter nonspecific but may reflect mild chronic microvascular ischemic changes. Vascular: Major vessel flow voids at the skull base are preserved. Skull and upper cervical spine: Normal marrow signal is preserved. Sinuses/Orbits: Paranasal sinuses are aerated. Orbits are unremarkable. Other: Sella is unremarkable.  Mastoid air cells are clear. IMPRESSION: No evidence of recent infarction, hemorrhage, or mass.  Chronic left basal ganglia infarcts. Mild chronic microvascular ischemic changes. Electronically Signed   By: Macy Mis M.D.   On: 09/28/2020 12:46   MR CERVICAL SPINE WO CONTRAST  Result Date: 09/28/2020 CLINICAL DATA:  Altered mental status EXAM: MRI CERVICAL SPINE WITHOUT CONTRAST TECHNIQUE: Multiplanar, multisequence MR imaging of the cervical spine was performed. No intravenous contrast was administered. COMPARISON:  None. FINDINGS: Markedly motion degraded.  No definite high-grade canal narrowing. IMPRESSION: Markedly motion degraded studies essentially nondiagnostic. There is no high-grade canal narrowing. Electronically Signed   By: Macy Mis M.D.   On: 09/28/2020 12:49   DG Chest Port 1 View  Result Date: 09/27/2020 CLINICAL DATA:  Altered mental status.  History of dementia. EXAM: PORTABLE CHEST 1 VIEW COMPARISON:  Radiographs 01/22/2019.  CT 08/25/2017. FINDINGS: 1234 hours. The heart size and mediastinal contours are stable with aortic atherosclerosis and great vessel tortuosity. The lungs appear clear. There is no pleural effusion or pneumothorax. The bones appear unremarkable. Telemetry leads overlie the chest. IMPRESSION: Stable chest.  No active cardiopulmonary process. Electronically Signed   By: Caryl Comes.D.  On: 09/27/2020 12:47   DG Knee Complete 4 Views Left  Result Date: 09/27/2020 CLINICAL DATA:  Knee pain EXAM: LEFT KNEE - COMPLETE 4+ VIEW COMPARISON:  Knee radiographs dated 01/23/2013. FINDINGS: No evidence of fracture or dislocation. There is a small to moderate knee joint effusion. Moderate tricompartmental osteoarthritis is noted. IMPRESSION: Small to moderate knee joint effusion without acute osseous injury. Chronic degenerative changes. Electronically Signed   By: Zerita Boers M.D.   On: 09/27/2020 13:28   DG Knee Complete 4 Views Right  Result Date: 09/27/2020 CLINICAL DATA:  Knee pain EXAM: RIGHT KNEE - COMPLETE 4+ VIEW COMPARISON:  None. FINDINGS: No  evidence of fracture or dislocation. There is a small to moderate knee joint effusion. Moderate tricompartmental osteoarthritis is noted. IMPRESSION: Small to moderate knee joint effusion without acute osseous injury. Chronic degenerative changes. Electronically Signed   By: Zerita Boers M.D.   On: 09/27/2020 13:28        Scheduled Meds: . amLODipine  5 mg Oral Daily  . aspirin EC  325 mg Oral Daily  . atorvastatin  40 mg Oral q morning  . donepezil  10 mg Oral QHS  . ferrous sulfate  325 mg Oral Q breakfast  . heparin  5,000 Units Subcutaneous Q8H  . insulin aspart  0-15 Units Subcutaneous TID WC  . insulin aspart  0-5 Units Subcutaneous QHS  . insulin aspart  4 Units Subcutaneous TID WC  . labetalol  400 mg Oral BID  . levothyroxine  150 mcg Oral q morning  . omega-3 acid ethyl esters  1 g Oral Daily  . QUEtiapine  25 mg Oral QHS  . sertraline  100 mg Oral q morning    LOS: 1 day    Time spent: 35 minutes    Clearance [place] Darleen Crocker, DO [facility]  If 7PM-7AM, please contact night-coverage [email or web address] 09/28/2020, 1:09 PM

## 2020-09-28 NOTE — NC FL2 (Signed)
[place] LEVEL OF CARE SCREENING TOOL     IDENTIFICATION  Patient Name: Courtney [name] Birthdate: 1945/12/08 Sex: female Admission Date (Current Location): 09/27/2020  [facility] and [place] Number:  [employer] and Address:  [place] [address], [place]       Provider Number: [id number]  Attending Physician Name and Address:  Rodena Goldmann, DO  Relative Name and Phone Number:       Current Level of Care: Hospital Recommended Level of Care: [place] Prior Approval Number:    Date Approved/Denied:   PASRR Number:    Discharge Plan: SNF    Current Diagnoses: Patient Active Problem List   Diagnosis Date Noted  . AKI (acute kidney injury) ([place]) 09/27/2020  . CVA (cerebral vascular accident) ([place]) 08/21/2019  . Type 2 diabetes mellitus with hyperlipidemia ([place]) 08/21/2019  . DM (diabetes mellitus) ([place]) 08/20/2019  . Hypothyroidism 01/23/2019  . HTN (hypertension) 01/23/2019  . Hypothermia 01/22/2019  . Osteoarthritis of left knee 01/23/2013    Orientation RESPIRATION BLADDER Height & Weight     Self  Normal Incontinent Weight: 213 lb (96.6 kg) Height:  5\' 1"  (154.9 cm)  BEHAVIORAL SYMPTOMS/MOOD NEUROLOGICAL BOWEL NUTRITION STATUS      Continent Diet (see dc summary)  AMBULATORY STATUS COMMUNICATION OF NEEDS Skin   Extensive Assist Verbally Normal                       Personal Care Assistance Level of Assistance  Bathing,Feeding,Dressing Bathing Assistance: Limited assistance Feeding assistance: Limited assistance Dressing Assistance: Limited assistance     Functional Limitations Info  Sight,Hearing,Speech Sight Info: Adequate Hearing Info: Adequate Speech Info: Adequate    SPECIAL CARE FACTORS FREQUENCY  PT (By licensed PT),OT (By licensed OT)     PT Frequency: 5x week OT Frequency: 3x week            Contractures Contractures Info: Not present    Additional Factors  Info  Code Status,Allergies,Psychotropic Code Status Info: DNR Allergies Info: NKA Psychotropic Info: Zoloft, Seroquel         Current Medications (09/28/2020):  This is the current hospital active medication list Current Facility-Administered Medications  Medication Dose Route Frequency Provider Last Rate Last Admin  . acetaminophen (TYLENOL) tablet 650 mg  650 mg Oral Q6H PRN Manuella Ghazi, Pratik D, DO       Or  . acetaminophen (TYLENOL) suppository 650 mg  650 mg Rectal Q6H PRN Manuella Ghazi, Pratik D, DO      . amLODipine (NORVASC) tablet 5 mg  5 mg Oral Daily Manuella Ghazi, Pratik D, DO   5 mg at 09/28/20 0911  . aspirin EC tablet 325 mg  325 mg Oral Daily Heath Lark D, DO   325 mg at 09/28/20 0911  . atorvastatin (LIPITOR) tablet 40 mg  40 mg Oral q morning Manuella Ghazi, Pratik D, DO      . donepezil (ARICEPT) tablet 10 mg  10 mg Oral QHS Shah, Pratik D, DO   10 mg at 09/27/20 2230  . ferrous sulfate tablet 325 mg  325 mg Oral Q breakfast Heath Lark D, DO   325 mg at 09/28/20 0911  . heparin injection 5,000 Units  5,000 Units Subcutaneous Q8H Shah, Pratik D, DO   5,000 Units at 09/28/20 0550  . insulin aspart (novoLOG) injection 0-15 Units  0-15 Units Subcutaneous TID WC Shah, Pratik D, DO      .  insulin aspart (novoLOG) injection 0-5 Units  0-5 Units Subcutaneous QHS Manuella Ghazi, Pratik D, DO      . insulin aspart (novoLOG) injection 4 Units  4 Units Subcutaneous TID WC Shah, Pratik D, DO   4 Units at 09/28/20 0914  . labetalol (NORMODYNE) tablet 400 mg  400 mg Oral BID Heath Lark D, DO   400 mg at 09/28/20 0911  . levothyroxine (SYNTHROID) tablet 150 mcg  150 mcg Oral q morning Manuella Ghazi, Pratik D, DO      . omega-3 acid ethyl esters (LOVAZA) capsule 1 g  1 g Oral Daily Manuella Ghazi, Pratik D, DO   1 g at 09/28/20 0911  . ondansetron (ZOFRAN) tablet 4 mg  4 mg Oral Q6H PRN Manuella Ghazi, Pratik D, DO       Or  . ondansetron (ZOFRAN) injection 4 mg  4 mg Intravenous Q6H PRN Manuella Ghazi, Pratik D, DO      . QUEtiapine (SEROQUEL) tablet 25 mg  25  mg Oral QHS Shah, Pratik D, DO   25 mg at 09/27/20 2230  . sertraline (ZOLOFT) tablet 100 mg  100 mg Oral q morning Heath Lark D, DO   100 mg at 09/28/20 0911     Discharge Medications: Please see discharge summary for a list of discharge medications.  Relevant Imaging Results:  Relevant Lab Results:   Additional Information SSN: [id number], [place]

## 2020-09-28 NOTE — Plan of Care (Signed)
  Problem: Acute Rehab PT Goals(only PT should resolve) Goal: Pt Will Go Supine/Side To Sit Flowsheets (Taken 09/28/2020 0930) Pt will go Supine/Side to Sit: with modified independence Goal: Patient Will Transfer Sit To/From Stand Flowsheets (Taken 09/28/2020 0930) Patient will transfer sit to/from stand: with min guard assist Goal: Pt Will Transfer Bed To Chair/Chair To Bed Flowsheets (Taken 09/28/2020 0930) Pt will Transfer Bed to Chair/Chair to Bed: with min assist Note: And RW Goal: Pt Will Ambulate Flowsheets (Taken 09/28/2020 0930) Pt will Ambulate:  15 feet  with minimal assist  with rolling walker   9:31 AM, 09/28/20 Jerene Pitch, DPT Physical Therapy with [facility]  [phone] office

## 2020-09-28 NOTE — Evaluation (Signed)
Physical Therapy Evaluation Patient Details Name: Courtney [name] MRN: [id number] DOB: [birth year] Today's Date: 09/28/2020   History of Present Illness  Courtney [name] is a 75 y.o. female with medical history significant for Alzheimer's dementia with prior CVA, hypertension, dyslipidemia, type 2 diabetes, hypothyroidism, and osteoarthritis who was brought to the ED for further evaluation on account of progressive weakness as well as some intermittent worsening confusion.  She has otherwise been eating and drinking well according to the husband, but has not been urinating very much.  No other complaints of lower extremity pain, fevers, chills, or dysuria noted.  Clinical Impression   Patient laying in bed and very agreeable to therapy. Husband, Courtney [name] present at time of evaluation and was able to complete history as patient with some gaps in history secondary to dementia. Patient able to perform all bed mobilities with no physical assist but required verbal cues for hand placement and trouble shooting how to get to edge of bed. Patient reported pain in knees with transition from sit to stand. Performed sit to stand 3x with moderate assist but patient unable to stand completely upright secondary to pain and fatigue. Not able to perform stand pivot transfer secondary to patient unable to pick up legs while standing. Patient's husband is able to assist with ADLS but unable to provide physical level of required at this time.   Patient will continue to benefit from skilled physical therapy in hospital and recommended venue below to increase strength, balance, endurance for safe ADLs and gait.    Follow Up Recommendations SNF;Supervision for mobility/OOB;Supervision/Assistance - 24 hour    Equipment Recommendations  None recommended by PT    Recommendations for Other Services       Precautions / Restrictions Precautions Precautions: Fall      Mobility  Bed Mobility Overal bed mobility: Needs  Assistance Bed Mobility: Sidelying to Sit;Supine to Sit;Sit to Supine   Sidelying to sit: Min assist Supine to sit: Min assist Sit to supine: Min assist   General bed mobility comments: required verbal cues for all movements, including scooting at bedside    Transfers Overall transfer level: Needs assistance   Transfers: Sit to/from Stand Sit to Stand: Mod assist         General transfer comment: with RW - patient unable to stand up straight even with physical assist. Condfused about hand placement on walker once up from bed  Ambulation/Gait             General Gait Details: unable at this time  Stairs            Wheelchair Mobility    Modified Rankin (Stroke Patients Only)       Balance Overall balance assessment: Needs assistance Sitting-balance support: Feet supported Sitting balance-Leahy Scale: Fair       Standing balance-Leahy Scale: Poor                               Pertinent Vitals/Pain Pain Assessment: Faces Faces Pain Scale: Hurts a little bit Pain Location: knees Pain Descriptors / Indicators: Sore Pain Intervention(s): Limited activity within patient's tolerance;Monitored during session    Home Living Family/patient expects to be discharged to:: Private residence Living Arrangements: Spouse/significant other Available Help at Discharge: Family;Available 24 hours/day Type of Home: House Home Access: Stairs to enter;Ramped entrance Entrance Stairs-Rails: Can reach both;Left;Right Entrance Stairs-Number of Steps: 2 Home Layout: One level Home Equipment: Clinical cytogeneticist -  2 wheels;Cane - single point;Bedside commode;Wheelchair - manual      Prior Function Level of Independence: Needs assistance   Gait / Transfers Assistance Needed: Patient normally household ambulator with no AD but SBA.  ADL's / Homemaking Assistance Needed: assisted by family due to coginition        Hand Dominance        Extremity/Trunk  Assessment        Lower Extremity Assessment Lower Extremity Assessment: Generalized weakness       Communication   Communication: No difficulties  Cognition Arousal/Alertness: Awake/alert Behavior During Therapy: WFL for tasks assessed/performed                                   General Comments: patient with dementia but alert and oriented x3      General Comments      Exercises General Exercises - Lower Extremity Long Arc Quad: AROM;Strengthening;Both;10 reps;Seated Hip Flexion/Marching: AROM;Strengthening;Both;10 reps;Seated Toe Raises: AROM;Strengthening;Both;10 reps;Seated Heel Raises: AROM;Strengthening;Both;10 reps;Seated   Assessment/Plan    PT Assessment Patient needs continued PT services  PT Problem List Decreased strength;Pain;Decreased balance;Decreased mobility;Decreased activity tolerance;Decreased safety awareness;Decreased range of motion;Decreased cognition       PT Treatment Interventions DME instruction;Gait training;Therapeutic exercise;Therapeutic activities;Manual techniques;Patient/family education;Neuromuscular re-education;Balance training    PT Goals (Current goals can be found in the Care Plan section)  Acute Rehab PT Goals Patient Stated Goal: to go home PT Goal Formulation: With patient Time For Goal Achievement: 10/12/20 Potential to Achieve Goals: Fair    Frequency Min 3X/week   Barriers to discharge Decreased caregiver support patient's husband unable to provide more then min guard assist wtih transfers and walking - currently patient requires mod assist with sit to stand and unable to perform stand pivot transfer secondary to weakness    Co-evaluation               AM-PAC PT "6 Clicks" Mobility  Outcome Measure Help needed turning from your back to your side while in a flat bed without using bedrails?: A Little Help needed moving from lying on your back to sitting on the side of a flat bed without using  bedrails?: A Little Help needed moving to and from a bed to a chair (including a wheelchair)?: A Lot Help needed standing up from a chair using your arms (e.g., wheelchair or bedside chair)?: A Little Help needed to walk in hospital room?: Total Help needed climbing 3-5 steps with a railing? : Total 6 Click Score: 13    End of Session Equipment Utilized During Treatment: Gait belt Activity Tolerance: Patient limited by fatigue;Patient limited by pain Patient left: in bed;with bed alarm set;with nursing/sitter in room;with family/visitor present Nurse Communication: Mobility status PT Visit Diagnosis: Unsteadiness on feet (R26.81);Muscle weakness (generalized) (M62.81);Pain;Difficulty in walking, not elsewhere classified (R26.2) Pain - Right/Left:  (both) Pain - part of body: Knee    Time: 8032-1224 PT Time Calculation (min) (ACUTE ONLY): 34 min   Charges:   PT Evaluation $PT Eval Low Complexity: 1 Low PT Treatments $Therapeutic Exercise: 8-22 mins       9:30 AM, 09/28/20 Jerene Pitch, DPT Physical Therapy with [facility]  [phone] office

## 2020-09-28 NOTE — TOC Initial Note (Signed)
Transition of Care [facility]) - Initial/Assessment Note    Patient Details  Name: Courtney [name] MRN: [id number] Date of Birth: 1946/03/24  Transition [facility] Care [facility]) CM/SW Contact:    Shade Flood, LCSW Phone Number: 09/28/2020, 11:31 AM  Clinical Narrative:                  Pt admitted from home. PT recommending SNF rehab. Spoke with pt's daughter at bedside (Pt at MRI) to discuss. Per daughter, pt and family agreeable to SNF rehab. Discussed provider options and will refer as requested. MD anticipating dc in 1-2 days. Started insurance auth this AM.  Will follow.  Expected Discharge Plan: Skilled Nursing Facility Barriers to Discharge: Continued Medical Work up   Patient Goals and CMS Choice Patient states their goals for this hospitalization and ongoing recovery are:: get better CMS Medicare.gov Compare Post Acute Care list provided to:: Patient Represenative (must comment) Choice offered to / list presented to : Adult Children  Expected Discharge Plan and Services Expected Discharge Plan: [place] In-house Referral: Clinical Social Work   Post Acute Care Choice: [place] Living arrangements for the past 2 months: [place]                                      Prior Living Arrangements/Services Living arrangements for the past 2 months: Single Family Home Lives with:: Spouse Patient language and need for interpreter reviewed:: Yes Do you feel safe going back to the place where you live?: Yes      Need for Family Participation in Patient Care: Yes (Comment) Care giver support system in place?: Yes (comment) Current home services: DME Criminal Activity/Legal Involvement Pertinent to Current Situation/Hospitalization: No - Comment as needed  Activities of Daily Living Home Assistive Devices/Equipment: Wheelchair,Walker (specify type) ADL Screening (condition at time of admission) Patient's cognitive ability adequate to  safely complete daily activities?: Yes Is the patient deaf or have difficulty hearing?: No Does the patient have difficulty seeing, even when wearing glasses/contacts?: No Does the patient have difficulty concentrating, remembering, or making decisions?: No Patient able to express need for assistance with ADLs?: Yes Does the patient have difficulty dressing or bathing?: Yes Independently performs ADLs?: No Communication: Independent Dressing (OT): Needs assistance Is this a change from baseline?: Pre-admission baseline Grooming: Needs assistance Is this a change from baseline?: Pre-admission baseline Feeding: Independent Bathing: Needs assistance Is this a change from baseline?: Pre-admission baseline Toileting: Needs assistance Is this a change from baseline?: Pre-admission baseline In/Out Bed: Needs assistance Is this a change from baseline?: Pre-admission baseline Walks in Home: Needs assistance Is this a change from baseline?: Pre-admission baseline Does the patient have difficulty walking or climbing stairs?: Yes Weakness of Legs: Both Weakness of Arms/Hands: None  Permission Sought/Granted Permission sought to share information with : Chartered certified accountant granted to share information with : Yes, Verbal Permission Granted     Permission granted to share info w AGENCY: SNFs        Emotional Assessment       Orientation: : Oriented to Self Alcohol / Substance Use: Not Applicable Psych Involvement: No (comment)  Admission diagnosis:  Weakness [R53.1] AKI (acute kidney injury) ([place]) [N17.9] Chronic pain of both knees [M25.561, M25.562, G89.29] Dementia without behavioral disturbance, unspecified dementia type ([place]) [F03.90] Patient Active Problem List   Diagnosis Date Noted  . AKI (acute kidney  injury) ([place]) 09/27/2020  . CVA (cerebral vascular accident) ([place]) 08/21/2019  . Type 2 diabetes mellitus with hyperlipidemia ([place]) 08/21/2019  . DM  (diabetes mellitus) ([place]) 08/20/2019  . Hypothyroidism 01/23/2019  . HTN (hypertension) 01/23/2019  . Hypothermia 01/22/2019  . Osteoarthritis of left knee 01/23/2013   PCP:  Cory Munch, PA-C Pharmacy:   [place], [address] [place] [address] Phone: [phone] Fax: [phone]  [facility] - [place], [place] - [address] [place] [address] Phone: [phone] Fax: [phone]     Social Determinants of Health (SDOH) Interventions    Readmission Risk Interventions Readmission Risk Prevention Plan 09/28/2020  Transportation Screening Complete  Home Care Screening Complete  Medication Review (RN CM) Complete  Some recent data might be hidden

## 2020-09-28 NOTE — Progress Notes (Signed)
Transition of Care (TOC) -30 day Note       Patient Details  Name: Courtney [name] MRN: [id number] Date of Birth: [birth year]   Transition of Care [facility]) CM/SW Contact  Name: Shade Flood  Phone Number: [phone] Date: 09/28/2020 Time: 11:42   MUST ID:    To Whom it May Concern:   Please be advised that the above patient will require a short-term nursing home stay, anticipated 30 days or less rehabilitation and strengthening. The plan is for return home. The patient has a dementia diagnosis that supercedes any mental illness diagnosis.

## 2020-09-29 ENCOUNTER — Inpatient Hospital Stay
Admission: RE | Admit: 2020-09-29 | Discharge: 2020-10-05 | Disposition: A | Discharge status: Home / Self Care | Payer: PPO | Source: Ambulatory Visit | Attending: Internal Medicine | Admitting: Internal Medicine

## 2020-09-29 DIAGNOSIS — E1149 Type 2 diabetes mellitus with other diabetic neurological complication: Secondary | ICD-10-CM | POA: Diagnosis not present

## 2020-09-29 DIAGNOSIS — Z9181 History of falling: Secondary | ICD-10-CM | POA: Diagnosis not present

## 2020-09-29 DIAGNOSIS — G301 Alzheimer's disease with late onset: Secondary | ICD-10-CM | POA: Diagnosis not present

## 2020-09-29 DIAGNOSIS — R600 Localized edema: Secondary | ICD-10-CM | POA: Diagnosis not present

## 2020-09-29 DIAGNOSIS — S92154A Nondisplaced avulsion fracture (chip fracture) of right talus, initial encounter for closed fracture: Secondary | ICD-10-CM | POA: Diagnosis not present

## 2020-09-29 DIAGNOSIS — E1122 Type 2 diabetes mellitus with diabetic chronic kidney disease: Secondary | ICD-10-CM | POA: Diagnosis not present

## 2020-09-29 DIAGNOSIS — R262 Difficulty in walking, not elsewhere classified: Secondary | ICD-10-CM | POA: Diagnosis not present

## 2020-09-29 DIAGNOSIS — M170 Bilateral primary osteoarthritis of knee: Secondary | ICD-10-CM | POA: Diagnosis not present

## 2020-09-29 DIAGNOSIS — E039 Hypothyroidism, unspecified: Secondary | ICD-10-CM | POA: Diagnosis not present

## 2020-09-29 DIAGNOSIS — I10 Essential (primary) hypertension: Secondary | ICD-10-CM | POA: Diagnosis not present

## 2020-09-29 DIAGNOSIS — N179 Acute kidney failure, unspecified: Secondary | ICD-10-CM | POA: Diagnosis not present

## 2020-09-29 DIAGNOSIS — E538 Deficiency of other specified B group vitamins: Secondary | ICD-10-CM | POA: Diagnosis not present

## 2020-09-29 DIAGNOSIS — E1169 Type 2 diabetes mellitus with other specified complication: Secondary | ICD-10-CM | POA: Diagnosis not present

## 2020-09-29 DIAGNOSIS — I639 Cerebral infarction, unspecified: Secondary | ICD-10-CM | POA: Diagnosis not present

## 2020-09-29 DIAGNOSIS — M159 Polyosteoarthritis, unspecified: Secondary | ICD-10-CM | POA: Diagnosis not present

## 2020-09-29 DIAGNOSIS — S92901S Unspecified fracture of right foot, sequela: Secondary | ICD-10-CM | POA: Diagnosis not present

## 2020-09-29 DIAGNOSIS — S92151D Displaced avulsion fracture (chip fracture) of right talus, subsequent encounter for fracture with routine healing: Secondary | ICD-10-CM | POA: Diagnosis not present

## 2020-09-29 DIAGNOSIS — D638 Anemia in other chronic diseases classified elsewhere: Secondary | ICD-10-CM | POA: Diagnosis not present

## 2020-09-29 DIAGNOSIS — E785 Hyperlipidemia, unspecified: Secondary | ICD-10-CM | POA: Diagnosis not present

## 2020-09-29 DIAGNOSIS — F0390 Unspecified dementia without behavioral disturbance: Secondary | ICD-10-CM | POA: Diagnosis not present

## 2020-09-29 DIAGNOSIS — R2681 Unsteadiness on feet: Secondary | ICD-10-CM | POA: Diagnosis not present

## 2020-09-29 DIAGNOSIS — Z8673 Personal history of transient ischemic attack (TIA), and cerebral infarction without residual deficits: Secondary | ICD-10-CM | POA: Diagnosis not present

## 2020-09-29 DIAGNOSIS — M6281 Muscle weakness (generalized): Secondary | ICD-10-CM | POA: Diagnosis not present

## 2020-09-29 DIAGNOSIS — E1159 Type 2 diabetes mellitus with other circulatory complications: Secondary | ICD-10-CM | POA: Diagnosis not present

## 2020-09-29 DIAGNOSIS — F339 Major depressive disorder, recurrent, unspecified: Secondary | ICD-10-CM | POA: Diagnosis not present

## 2020-09-29 DIAGNOSIS — N183 Chronic kidney disease, stage 3 unspecified: Secondary | ICD-10-CM | POA: Diagnosis not present

## 2020-09-29 DIAGNOSIS — Z794 Long term (current) use of insulin: Secondary | ICD-10-CM | POA: Diagnosis not present

## 2020-09-29 DIAGNOSIS — N184 Chronic kidney disease, stage 4 (severe): Secondary | ICD-10-CM | POA: Diagnosis not present

## 2020-09-29 DIAGNOSIS — F0281 Dementia in other diseases classified elsewhere with behavioral disturbance: Secondary | ICD-10-CM | POA: Diagnosis not present

## 2020-09-29 DIAGNOSIS — E0869 Diabetes mellitus due to underlying condition with other specified complication: Secondary | ICD-10-CM | POA: Diagnosis not present

## 2020-09-29 DIAGNOSIS — G309 Alzheimer's disease, unspecified: Secondary | ICD-10-CM | POA: Diagnosis not present

## 2020-09-29 LAB — BASIC METABOLIC PANEL
Anion gap: 9 (ref 5–15)
BUN: 36 mg/dL — ABNORMAL HIGH (ref 8–23)
CO2: 22 mmol/L (ref 22–32)
Calcium: 8.5 mg/dL — ABNORMAL LOW (ref 8.9–10.3)
Chloride: 105 mmol/L (ref 98–111)
Creatinine, Ser: 1.35 mg/dL — ABNORMAL HIGH (ref 0.44–1.00)
GFR, Estimated: 41 mL/min — ABNORMAL LOW (ref >60)
Glucose, Bld: 172 mg/dL — ABNORMAL HIGH (ref 70–99)
Potassium: 4.2 mmol/L (ref 3.5–5.1)
Sodium: 136 mmol/L (ref 135–145)

## 2020-09-29 LAB — GLUCOSE, CAPILLARY
Glucose-Capillary: 168 mg/dL — ABNORMAL HIGH (ref 70–99)
Glucose-Capillary: 172 mg/dL — ABNORMAL HIGH (ref 70–99)

## 2020-09-29 LAB — MAGNESIUM: Magnesium: 1.8 mg/dL (ref 1.7–2.4)

## 2020-09-29 MED ORDER — VITAMIN B-12 1000 MCG PO TABS: 1000.0000 ug | ORAL_TABLET | Freq: Every day | ORAL | 0 refills | Status: AC

## 2020-09-29 MED ORDER — FOLIC ACID 1 MG PO TABS: 1.0000 mg | ORAL_TABLET | Freq: Every day | ORAL | 0 refills | Status: DC

## 2020-09-29 MED ORDER — FOLIC ACID 1 MG PO TABS
1.0000 mg | ORAL_TABLET | Freq: Every day | ORAL | Status: DC
  Administered 2020-09-29: 1 mg via ORAL
  Filled 2020-09-29: qty 1

## 2020-09-29 MED ORDER — CYANOCOBALAMIN 1000 MCG/ML IJ SOLN
1000.0000 ug | Freq: Once | INTRAMUSCULAR | Status: AC
  Administered 2020-09-29: 1000 ug via INTRAMUSCULAR
  Filled 2020-09-29: qty 1

## 2020-09-29 NOTE — Discharge Summary (Signed)
Physician Discharge Summary  Courtney [name] DJM:[id number] DOB: 02/09/1946 DOA: 09/27/2020  PCP: Courtney Munch, PA-C  Admit date: 09/27/2020  Discharge date: 09/29/2020  Admitted From:Home  Disposition:  SNF  Recommendations for Outpatient Follow-up:  1. Follow up with PCP in 1-2 weeks 2. Follow up with Orthopedics outpatient as recommended and continue to wear boot to R foot as recommended 3. Continue on medications as noted below with addition of folic acid and Q22  Home Health:None  Equipment/Devices:none  Discharge Condition:Stable  CODE STATUS: DNR  Diet recommendation: Heart Healthy/Carb Modified  Brief/Interim Summary:  Courtney [name] a 75 y.o.femalewith medical history significant forAlzheimer's dementia with prior CVA, hypertension, dyslipidemia, type 2 diabetes, hypothyroidism, and osteoarthritis who was brought to the ED for further evaluation on account of progressive weakness as well as some intermittentworseningconfusion.   She was also noted to have AKI on CKD stage IIIb/IV and has had improvement in her renal function after IV fluid administration.  Brain MRI and MRI C-spine without any acute abnormalities.  She was noted to have some L79 and folic acid deficiencies which will be replaced.  Additionally, it appears that she may have fallen at home while showering and injured her right foot which is now demonstrating an avulsion fracture to her right foot.  Orthopedics currently recommending conservative management with space boot and walker. Plans to follow up outpatient. PT has evaluated pt with recommendations for inpatient rehabilitation and patient and family are agreeable.  No other acute events noted throughout the course of this admission.  She is in stable condition for discharge.  It was conveyed to family members that the likelihood of her being able to improve with rehabilitation is quite poor as she is having some apraxia secondary to her  dementia.  Discharge Diagnoses:  Active Problems:   AKI (acute kidney injury) ([place])  Principal discharge diagnosis: AKI-prerenal on CKD stage IIIb/IV along with generalized weakness in the setting of progressive dementia.  Right foot avulsion fracture.  Discharge Instructions  Discharge Instructions    Diet - low sodium heart healthy   Complete by: As directed    Increase activity slowly   Complete by: As directed      Allergies as of 09/29/2020   No Known Allergies     Medication List    TAKE these medications   acetaminophen 325 MG tablet Commonly known as: TYLENOL Take 2 tablets (650 mg total) by mouth every 6 (six) hours as needed for mild pain, fever or headache (or Fever >/= 101).   amLODipine 5 MG tablet Commonly known as: NORVASC Take 5 mg by mouth daily.   aspirin EC 325 MG tablet Take 1 tablet (325 mg total) by mouth daily.   atorvastatin 40 MG tablet Commonly known as: LIPITOR Take 1 tablet (40 mg total) by mouth every morning.   donepezil 10 MG tablet Commonly known as: ARICEPT Take 10 mg by mouth at bedtime.   ferrous sulfate 325 (65 FE) MG tablet Take 325 mg by mouth daily with breakfast.   folic acid 1 MG tablet Commonly known as: FOLVITE Take 1 tablet (1 mg total) by mouth daily.   furosemide 20 MG tablet Commonly known as: LASIX Take 40 mg by mouth every morning.   labetalol 200 MG tablet Commonly known as: NORMODYNE Take 400 mg by mouth 2 (two) times daily.   Lantus SoloStar 100 UNIT/ML Solostar Pen Generic drug: insulin glargine Inject 40 Units into the skin at bedtime. What changed:  how much to take  when to take this   levothyroxine 150 MCG tablet Commonly known as: SYNTHROID Take 150 mcg by mouth every morning.   lisinopril 5 MG tablet Commonly known as: ZESTRIL Take 5 mg by mouth daily.   Omega 3 1000 MG Caps Take 1 capsule by mouth daily.   QUEtiapine 25 MG tablet Commonly known as: SEROQUEL Take 25 mg by mouth  every morning.   sertraline 100 MG tablet Commonly known as: ZOLOFT Take 100 mg by mouth every morning.   vitamin B-12 1000 MCG tablet Commonly known as: CYANOCOBALAMIN Take 1 tablet (1,000 mcg total) by mouth daily.       Contact information for follow-up providers    Courtney Munch, PA-C. Schedule an appointment as soon as possible for a visit in 1 week(s).   Specialties: Physician Assistant, Internal Medicine Contact information: [place] [address] [phone]        Courtney Civil, MD. Schedule an appointment as soon as possible for a visit in 4 week(s).   Specialties: Orthopedic Surgery, Radiology Contact information: [address] [place] [address] [phone]            Contact information for after-discharge care    [facility] .   Service: Skilled Nursing Contact information: [address] [place] [phone]                 No Known Allergies  Consultations:  Orthopedics   Procedures/Studies: CT Head Wo Contrast  Result Date: 09/27/2020 CLINICAL DATA:  Altered mental status. EXAM: CT HEAD WITHOUT CONTRAST TECHNIQUE: Contiguous axial images were obtained from the base of the skull through the vertex without intravenous contrast. COMPARISON:  08/20/2019 FINDINGS: Brain: Stable mildly enlarged ventricles and cortical sulci. Stable mild patchy white matter low density in both cerebral hemispheres. No intracranial hemorrhage, mass lesion or CT evidence of acute infarction. Vascular: No hyperdense vessel or unexpected calcification. Skull: Mild bilateral hyperostosis frontalis. Sinuses/Orbits: Unremarkable. Other: None. IMPRESSION: No acute abnormality. Electronically Signed   By: Claudie Revering M.D.   On: 09/27/2020 12:55   MR BRAIN WO CONTRAST  Result Date: 09/28/2020 CLINICAL DATA:  Altered mental status EXAM: MRI HEAD WITHOUT  CONTRAST TECHNIQUE: Multiplanar, multiecho pulse sequences of the brain and surrounding structures were obtained without intravenous contrast. COMPARISON:  08/20/2019 FINDINGS: Motion artifact is present. Brain: There is no acute infarction or intracranial hemorrhage. There is no intracranial mass, mass effect, or edema. There is no hydrocephalus or extra-axial fluid collection. Ventricles and sulci are prominent reflecting generalized parenchymal volume loss. Chronic small vessel infarcts of the left basal ganglia. Additional patchy foci of T2 hyperintensity in the supratentorial white matter nonspecific but may reflect mild chronic microvascular ischemic changes. Vascular: Major vessel flow voids at the skull base are preserved. Skull and upper cervical spine: Normal marrow signal is preserved. Sinuses/Orbits: Paranasal sinuses are aerated. Orbits are unremarkable. Other: Sella is unremarkable.  Mastoid air cells are clear. IMPRESSION: No evidence of recent infarction, hemorrhage, or mass. Chronic left basal ganglia infarcts. Mild chronic microvascular ischemic changes. Electronically Signed   By: Macy Mis M.D.   On: 09/28/2020 12:46   MR CERVICAL SPINE WO CONTRAST  Result Date: 09/28/2020 CLINICAL DATA:  Altered mental status EXAM: MRI CERVICAL SPINE WITHOUT CONTRAST TECHNIQUE: Multiplanar, multisequence MR imaging of the cervical spine was performed. No intravenous contrast was administered. COMPARISON:  None.  FINDINGS: Markedly motion degraded.  No definite high-grade canal narrowing. IMPRESSION: Markedly motion degraded studies essentially nondiagnostic. There is no high-grade canal narrowing. Electronically Signed   By: Macy Mis M.D.   On: 09/28/2020 12:49   DG Chest Port 1 View  Result Date: 09/27/2020 CLINICAL DATA:  Altered mental status.  History of dementia. EXAM: PORTABLE CHEST 1 VIEW COMPARISON:  Radiographs 01/22/2019.  CT 08/25/2017. FINDINGS: 1234 hours. The heart size and  mediastinal contours are stable with aortic atherosclerosis and great vessel tortuosity. The lungs appear clear. There is no pleural effusion or pneumothorax. The bones appear unremarkable. Telemetry leads overlie the chest. IMPRESSION: Stable chest.  No active cardiopulmonary process. Electronically Signed   By: Richardean Sale M.D.   On: 09/27/2020 12:47   DG Knee Complete 4 Views Left  Result Date: 09/27/2020 CLINICAL DATA:  Knee pain EXAM: LEFT KNEE - COMPLETE 4+ VIEW COMPARISON:  Knee radiographs dated 01/23/2013. FINDINGS: No evidence of fracture or dislocation. There is a small to moderate knee joint effusion. Moderate tricompartmental osteoarthritis is noted. IMPRESSION: Small to moderate knee joint effusion without acute osseous injury. Chronic degenerative changes. Electronically Signed   By: Zerita Boers M.D.   On: 09/27/2020 13:28   DG Knee Complete 4 Views Right  Result Date: 09/27/2020 CLINICAL DATA:  Knee pain EXAM: RIGHT KNEE - COMPLETE 4+ VIEW COMPARISON:  None. FINDINGS: No evidence of fracture or dislocation. There is a small to moderate knee joint effusion. Moderate tricompartmental osteoarthritis is noted. IMPRESSION: Small to moderate knee joint effusion without acute osseous injury. Chronic degenerative changes. Electronically Signed   By: Zerita Boers M.D.   On: 09/27/2020 13:28   DG Foot Complete Right  Result Date: 09/28/2020 CLINICAL DATA:  Right foot pain. EXAM: RIGHT FOOT COMPLETE - 3+ VIEW COMPARISON:  None. FINDINGS: Technically difficult positioning due to limited mobility. Possible small avulsion type fracture involving the dorsal midfoot in the region of the talar cuneiform articulation. Alternatively this may be chronic. No other fracture of the foot. Mild hammertoe deformity of the digits. Mild midfoot degenerative spurring. Questionable soft tissue edema. IMPRESSION: Possible small avulsion type fracture involving the dorsal midfoot in the region of the talar cuneiform  articulation, may be chronic, however recommend correlation with point tenderness. No other fracture of the foot. Electronically Signed   By: Keith Rake M.D.   On: 09/28/2020 19:41      Discharge Exam: Vitals:   09/29/20 0634 09/29/20 1141  BP: (!) 151/64 (!) 162/84  Pulse: 76 67  Resp: 20   Temp: (!) 100.6 F (38.1 C)   SpO2: 97%    Vitals:   09/28/20 1415 09/28/20 2037 09/29/20 0634 09/29/20 1141  BP: 127/71 (!) 168/73 (!) 151/64 (!) 162/84  Pulse: (!) 57 70 76 67  Resp: 18 20 20    Temp: 98.3 F (36.8 C) (!) 100.7 F (38.2 C) (!) 100.6 F (38.1 C)   TempSrc: Oral Oral Oral   SpO2: 97% 98% 97%   Weight:      Height:        General: Pt is alert, awake, not in acute distress, pleasantly confused Cardiovascular: RRR, S1/S2 +, no rubs, no gallops Respiratory: CTA bilaterally, no wheezing, no rhonchi Abdominal: Soft, NT, ND, bowel sounds + Extremities: no edema, no cyanosis    The results of significant diagnostics from this hospitalization (including imaging, microbiology, ancillary and laboratory) are listed below for reference.     Microbiology: Recent Results (from the past 240  hour(s))  Resp Panel by RT-PCR (Flu A&B, Covid) Nasopharyngeal Swab     Status: None   Collection Time: 09/27/20  1:50 PM   Specimen: Nasopharyngeal Swab; Nasopharyngeal(NP) swabs in vial transport medium  Result Value Ref Range Status   SARS Coronavirus 2 by RT PCR NEGATIVE NEGATIVE Final    Comment: (NOTE) SARS-CoV-2 target nucleic acids are NOT DETECTED.  The SARS-CoV-2 RNA is generally detectable in upper respiratory specimens during the acute phase of infection. The lowest concentration of SARS-CoV-2 viral copies this assay can detect is 138 copies/mL. A negative result does not preclude SARS-Cov-2 infection and should not be used as the sole basis for treatment or other patient management decisions. A negative result may occur with  improper specimen collection/handling,  submission of specimen other than nasopharyngeal swab, presence of viral mutation(s) within the areas targeted by this assay, and inadequate number of viral copies(<138 copies/mL). A negative result must be combined with clinical observations, patient history, and epidemiological information. The expected result is Negative.  Fact Sheet for Patients:  EntrepreneurPulse.com.au  Fact Sheet for Healthcare Providers:  IncredibleEmployment.be  This test is no t yet approved or cleared by the [place] FDA and  has been authorized for detection and/or diagnosis of SARS-CoV-2 by FDA under an Emergency Use Authorization (EUA). This EUA will remain  in effect (meaning this test can be used) for the duration of the COVID-19 declaration under Section 564(b)(1) of the Act, 21 U.S.C.section 360bbb-3(b)(1), unless the authorization is terminated  or revoked sooner.       Influenza A by PCR NEGATIVE NEGATIVE Final   Influenza B by PCR NEGATIVE NEGATIVE Final    Comment: (NOTE) The Xpert Xpress SARS-CoV-2/FLU/RSV plus assay is intended as an aid in the diagnosis of influenza from Nasopharyngeal swab specimens and should not be used as a sole basis for treatment. Nasal washings and aspirates are unacceptable for Xpert Xpress SARS-CoV-2/FLU/RSV testing.  Fact Sheet for Patients: EntrepreneurPulse.com.au  Fact Sheet for Healthcare Providers: IncredibleEmployment.be  This test is not yet approved or cleared by the [place] FDA and has been authorized for detection and/or diagnosis of SARS-CoV-2 by FDA under an Emergency Use Authorization (EUA). This EUA will remain in effect (meaning this test can be used) for the duration of the COVID-19 declaration under Section 564(b)(1) of the Act, 21 U.S.C. section 360bbb-3(b)(1), unless the authorization is terminated or revoked.  Performed at [facility], [address], [place], [place] [address]      Labs: BNP (last 3 results) No results for input(s): BNP in the last 8760 hours. Basic Metabolic Panel: Recent Labs  Lab 09/27/20 1220 09/27/20 1527 09/28/20 0507 09/29/20 0603  NA 137  --  139 136  K 4.2  --  4.0 4.2  CL 103  --  108 105  CO2 25  --  25 22  GLUCOSE 177*  --  107* 172*  BUN 56*  --  48* 36*  CREATININE 2.49* 2.34* 1.82* 1.35*  CALCIUM 8.8*  --  8.4* 8.5*  MG  --   --  1.9 1.8   Liver Function Tests: Recent Labs  Lab 09/27/20 1220  AST 19  ALT 16  ALKPHOS 46  BILITOT 0.6  PROT 6.7  ALBUMIN 3.5   No results for input(s): LIPASE, AMYLASE in the last 168 hours. No results for input(s): AMMONIA in the last 168 hours. CBC: Recent Labs  Lab 09/27/20 1220 09/27/20 1527 09/28/20 0507  WBC 9.1 8.3 7.7  NEUTROABS 6.5  --   --   HGB 10.5* 9.2* 9.0*  HCT 32.1* 28.5* 27.8*  MCV 93.9 94.1 93.6  PLT 164 134* 145*   Cardiac Enzymes: No results for input(s): CKTOTAL, CKMB, CKMBINDEX, TROPONINI in the last 168 hours. BNP: Invalid input(s): POCBNP CBG: Recent Labs  Lab 09/28/20 0809 09/28/20 1146 09/28/20 1602 09/28/20 2143 09/29/20 0751  GLUCAP 110* 79 160* 90 168*   D-Dimer No results for input(s): DDIMER in the last 72 hours. Hgb A1c Recent Labs    09/27/20 1527  HGBA1C 7.6*   Lipid Profile No results for input(s): CHOL, HDL, LDLCALC, TRIG, CHOLHDL, LDLDIRECT in the last 72 hours. Thyroid function studies Recent Labs    09/27/20 1527  TSH 1.189   Anemia work up Recent Labs    09/28/20 0507  VITAMINB12 171*  FOLATE 5.3*   Urinalysis    Component Value Date/Time   COLORURINE STRAW (A) 09/27/2020 1931   APPEARANCEUR CLEAR 09/27/2020 1931   LABSPEC 1.009 09/27/2020 1931   PHURINE 5.0 09/27/2020 1931   GLUCOSEU NEGATIVE 09/27/2020 1931   HGBUR SMALL (A) 09/27/2020 1931   BILIRUBINUR NEGATIVE 09/27/2020 1931   KETONESUR NEGATIVE 09/27/2020 1931   PROTEINUR NEGATIVE 09/27/2020 1931   UROBILINOGEN  0.2 04/24/2014 1406   NITRITE NEGATIVE 09/27/2020 1931   LEUKOCYTESUR NEGATIVE 09/27/2020 1931   Sepsis Labs Invalid input(s): PROCALCITONIN,  WBC,  LACTICIDVEN Microbiology Recent Results (from the past 240 hour(s))  Resp Panel by RT-PCR (Flu A&B, Covid) Nasopharyngeal Swab     Status: None   Collection Time: 09/27/20  1:50 PM   Specimen: Nasopharyngeal Swab; Nasopharyngeal(NP) swabs in vial transport medium  Result Value Ref Range Status   SARS Coronavirus 2 by RT PCR NEGATIVE NEGATIVE Final    Comment: (NOTE) SARS-CoV-2 target nucleic acids are NOT DETECTED.  The SARS-CoV-2 RNA is generally detectable in upper respiratory specimens during the acute phase of infection. The lowest concentration of SARS-CoV-2 viral copies this assay can detect is 138 copies/mL. A negative result does not preclude SARS-Cov-2 infection and should not be used as the sole basis for treatment or other patient management decisions. A negative result may occur with  improper specimen collection/handling, submission of specimen other than nasopharyngeal swab, presence of viral mutation(s) within the areas targeted by this assay, and inadequate number of viral copies(<138 copies/mL). A negative result must be combined with clinical observations, patient history, and epidemiological information. The expected result is Negative.  Fact Sheet for Patients:  EntrepreneurPulse.com.au  Fact Sheet for Healthcare Providers:  IncredibleEmployment.be  This test is no t yet approved or cleared by the [place] FDA and  has been authorized for detection and/or diagnosis of SARS-CoV-2 by FDA under an Emergency Use Authorization (EUA). This EUA will remain  in effect (meaning this test can be used) for the duration of the COVID-19 declaration under Section 564(b)(1) of the Act, 21 U.S.C.section 360bbb-3(b)(1), unless the authorization is terminated  or revoked sooner.        Influenza A by PCR NEGATIVE NEGATIVE Final   Influenza B by PCR NEGATIVE NEGATIVE Final    Comment: (NOTE) The Xpert Xpress SARS-CoV-2/FLU/RSV plus assay is intended as an aid in the diagnosis of influenza from Nasopharyngeal swab specimens and should not be used as a sole basis for treatment. Nasal washings and aspirates are unacceptable for Xpert Xpress SARS-CoV-2/FLU/RSV testing.  Fact Sheet for Patients: EntrepreneurPulse.com.au  Fact Sheet for Healthcare Providers: IncredibleEmployment.be  This test is not yet approved or  cleared by the [place] and has been authorized for detection and/or diagnosis of SARS-CoV-2 by FDA under an Emergency Use Authorization (EUA). This EUA will remain in effect (meaning this test can be used) for the duration of the COVID-19 declaration under Section 564(b)(1) of the Act, 21 U.S.C. section 360bbb-3(b)(1), unless the authorization is terminated or revoked.  Performed at [facility], [address] [place], [place] [address]      Time coordinating discharge: 35 minutes  SIGNED:   Rodena Goldmann, DO [facility] 09/29/2020, 12:06 PM  If 7PM-7AM, please contact night-coverage [email or web address]

## 2020-09-29 NOTE — TOC Transition Note (Signed)
Transition of Care [facility]) - CM/SW Discharge Note   Patient Details  Name: Courtney [name] MRN: [id number] Date of Birth: [birth year]  Transition of Care [facility]) CM/SW Contact:  Salome Arnt, LCSW Phone Number: 09/29/2020, 12:09 PM   Clinical Narrative:   Pt d/c today. [employer] initially denied SNF, but following peer-to-peer review, have approved for 7 days ([id number]). [facility] and daughter notified. Pt will transfer with [facility] staff. RN given number to call report. Kerri at [facility] aware of 30 day pasarr.     Final next level of care: Skilled Nursing Facility Barriers to Discharge: Barriers Resolved   Patient Goals and CMS Choice Patient states their goals for this hospitalization and ongoing recovery are:: get better CMS Medicare.gov Compare Post Acute Care list provided to:: Patient Represenative (must comment) Choice offered to / list presented to : Adult Children  Discharge Placement PASRR number recieved: 09/29/20 (30 day pasarr)            Patient chooses bed at: [facility] Patient to be transferred to facility by: [facility] staff Name of family member notified: daughter- [name] Patient and family notified of of transfer: 09/29/20  Discharge Plan and Services In-house Referral: Clinical Social Work   Post Acute Care Choice: [place]          DME Arranged: N/A DME Agency: NA                  Social Determinants of Health ([place]) Interventions     Readmission Risk Interventions Readmission Risk Prevention Plan 09/28/2020  Transportation Screening Complete  Home Care Screening Complete  Medication Review (RN CM) Complete  Some recent data might be hidden

## 2020-09-29 NOTE — Progress Notes (Signed)
Patient is without any apparent mental health issues.  She does have a history of Alzheimer's dementia.

## 2020-09-29 NOTE — Evaluation (Signed)
Occupational Therapy Evaluation Patient Details Name: Courtney [name] MRN: [id number] DOB: March 05, 1946 Today's Date: 09/29/2020    History of Present Illness Courtney [name] is a 75 y.o. female with medical history significant for Alzheimer's dementia with prior CVA, hypertension, dyslipidemia, type 2 diabetes, hypothyroidism, and osteoarthritis who was brought to the ED for further evaluation on account of progressive weakness as well as some intermittent worsening confusion.  She has otherwise been eating and drinking well according to the husband, but has not been urinating very much.  No other complaints of lower extremity pain, fevers, chills, or dysuria noted.   Clinical Impression   Pt agreeable to OT evaluation with family members present in the pt's room. Due to recent discovery of pt R small avulsion fracture pt evaluation was conducted at bedside. Pt demonstrated need for moderate assist for supine to sit bed mobility and min to mod A for sit to supine. Pt demonstrates B shoulder flexion A/ROM limitation secondary to reports of B shoulder arthrits. Pt will benefit from continued OT in the hospital and recommended venue below to increase strength, balance, ROM, and endurance for safe ADL's.     Follow Up Recommendations  SNF    Equipment Recommendations  None recommended by OT           Precautions / Restrictions Precautions Precautions: Fall Precaution Comments: Possible  small avulsion type fracture involving the dorsal midfoot in the  region of the talar cuneiform articulation. Alternatively this may  be chronic. Per imaging document review. Restrictions Weight Bearing Restrictions: No      Mobility Bed Mobility Overal bed mobility: Needs Assistance Bed Mobility: Supine to Sit;Sit to Supine     Supine to sit: Mod assist Sit to supine: Min assist;Mod assist   General bed mobility comments: slow labored movement    Transfers                 General transfer  comment: sit to stand not attempted due to recent finding of R avulsion fracture and pt wanting pt to stay off her R foot.    Balance Overall balance assessment: Needs assistance Sitting-balance support: Feet supported Sitting balance-Leahy Scale: Fair Sitting balance - Comments: EOB                                   ADL either performed or assessed with clinical judgement   ADL Overall ADL's : Needs assistance/impaired                                     Functional mobility during ADLs: Moderate assistance (mod assist needed for bed mobility.) General ADL Comments: slow labored movement     Vision Baseline Vision/History: Wears glasses Wears Glasses: At all times                  Pertinent Vitals/Pain Pain Assessment: Faces Faces Pain Scale: Hurts a little bit Pain Location: with movement; family reports pt has R foot pain. Pain Intervention(s): Limited activity within patient's tolerance     Hand Dominance     Extremity/Trunk Assessment Upper Extremity Assessment Upper Extremity Assessment: Generalized weakness;RUE deficits/detail;LUE deficits/detail RUE Deficits / Details: shoulder flexion A/ROM limited to ~90 to 110*. ~140 shoulder flexion P/ROM. Family reports pt has arthritis. LUE Deficits / Details: shoulder flexion A/ROM limited to ~90 to 110*. ~  140 shoulder flexion P/ROM. Family reports pt has arthritis.   Lower Extremity Assessment Lower Extremity Assessment: Defer to PT evaluation       Communication Communication Communication: No difficulties   Cognition Arousal/Alertness: Awake/alert Behavior During Therapy: WFL for tasks assessed/performed Overall Cognitive Status: History of cognitive impairments - at baseline                                 General Comments: patient with dementia                    Home Living Family/patient expects to be discharged to:: Private residence Living  Arrangements: Spouse/significant other Available Help at Discharge: Family;Available 24 hours/day Type of Home: House Home Access: Stairs to enter;Ramped entrance Entrance Stairs-Number of Steps: 2 Entrance Stairs-Rails: Can reach both;Left;Right Home Layout: One level     Bathroom Shower/Tub: Occupational psychologist: Standard     Home Equipment: Clinical cytogeneticist - 2 wheels;Cane - single point;Bedside commode;Wheelchair - manual   Additional Comments: History taken via prior documentation.      Prior Functioning/Environment Level of Independence: Needs assistance  Gait / Transfers Assistance Needed: Patient normally household ambulator with no AD but SBA. ADL's / Homemaking Assistance Needed: assisted by family due to coginition   Comments: history taken via documentation review        OT Problem List: Decreased strength;Decreased range of motion;Decreased activity tolerance;Impaired balance (sitting and/or standing);Decreased safety awareness;Decreased knowledge of use of DME or AE      OT Treatment/Interventions: Self-care/ADL training;Therapeutic exercise;DME and/or AE instruction;Energy conservation;Therapeutic activities;Cognitive remediation/compensation;Patient/family education;Balance training    OT Goals(Current goals can be found in the care plan section) Acute Rehab OT Goals Patient Stated Goal: to get rehab at a facility OT Goal Formulation: With patient/family Time For Goal Achievement: 10/13/20 Potential to Achieve Goals: Fair  OT Frequency: Min 2X/week                     End of Session    Activity Tolerance: Patient tolerated treatment well;Other (comment) (Limited by pain/possible fracture in R foot.) Patient left: in bed;with call bell/phone within reach  OT Visit Diagnosis: Unsteadiness on feet (R26.81);Other abnormalities of gait and mobility (R26.89);Muscle weakness (generalized) (M62.81)                Time: 9381-8299 OT Time  Calculation (min): 14 min Charges:  OT General Charges $OT Visit: 1 Visit OT Evaluation $OT Eval Low Complexity: [place] OT, MOT   Larey Seat 09/29/2020, 12:20 PM

## 2020-09-29 NOTE — Consult Note (Signed)
Reason for Consult: -Pain right foot possible fracture Referring Physician: Dr. Heath Lark  Courtney [name] is an 75 y.o. female.  HPI: 75 year old female passed out on May 8 hit her right foot against the bathtub and was brought in for evaluation and management of the malaise and fatigue and passing out.  When she sort of came to she complained of right foot pain she had an x-ray that showed a possible avulsion fracture near the talonavicular joint.  She complains of pain there it seems to be mild.  There is no deformity of the joint.  There is no radiation of the pain.  Past Medical History:  Diagnosis Date  . Alzheimer's dementia ([place]) 2015  . Cancer ([place])   . Diabetes ([place])   . Hypertension     Past Surgical History:  Procedure Laterality Date  . ABDOMINAL HYSTERECTOMY    . COLONOSCOPY N/A 05/14/2015   Procedure: COLONOSCOPY;  Surgeon: Rogene [place], MD;  Location: [facility];  Service: Endoscopy;  Laterality: N/A;  730  . OOPHORECTOMY      Family History  Problem Relation Age of Onset  . Rheum arthritis Mother   . Heart failure Mother   . Hypertension Mother   . Stroke Father   . Diabetes Brother   . Diabetes Sister   . Hypertension Maternal Grandmother     Social History:  reports that she quit smoking about 16 years ago. Her smoking use included cigarettes. She has a 5.00 pack-year smoking history. She has never used smokeless tobacco. She reports current alcohol use. She reports that she does not use drugs.  Allergies: No Known Allergies  No current facility-administered medications for this encounter. No current outpatient medications on file.   Results for orders placed or performed during the hospital encounter of 09/27/20 (from the past 48 hour(s))  Comprehensive metabolic panel     Status: Abnormal   Collection Time: 09/27/20 12:20 PM  Result Value Ref Range   Sodium 137 135 - 145 mmol/L   Potassium 4.2 3.5 - 5.1 mmol/L   Chloride 103 98 - 111 mmol/L    CO2 25 22 - 32 mmol/L   Glucose, Bld 177 (H) 70 - 99 mg/dL    Comment: Glucose reference range applies only to samples taken after fasting for at least 8 hours.   BUN 56 (H) 8 - 23 mg/dL   Creatinine, Ser 2.49 (H) 0.44 - 1.00 mg/dL   Calcium 8.8 (L) 8.9 - 10.3 mg/dL   Total Protein 6.7 6.5 - 8.1 g/dL   Albumin 3.5 3.5 - 5.0 g/dL   AST 19 15 - 41 U/L   ALT 16 0 - 44 U/L   Alkaline Phosphatase 46 38 - 126 U/L   Total Bilirubin 0.6 0.3 - 1.2 mg/dL   GFR, Estimated 20 (L) >60 mL/min    Comment: (NOTE) Calculated using the CKD-EPI Creatinine Equation (2021)    Anion gap 9 5 - 15    Comment: Performed at [facility], [address], [place], [place] [address]  CBC with Differential/Platelet     Status: Abnormal   Collection Time: 09/27/20 12:20 PM  Result Value Ref Range   WBC 9.1 4.0 - 10.5 K/uL   RBC 3.42 (L) 3.87 - 5.11 MIL/uL   Hemoglobin 10.5 (L) 12.0 - 15.0 g/dL   HCT 32.1 (L) 36.0 - 46.0 %   MCV 93.9 80.0 - 100.0 fL   MCH 30.7 26.0 - 34.0 pg   MCHC  32.7 30.0 - 36.0 g/dL   RDW 13.9 11.5 - 15.5 %   Platelets 164 150 - 400 K/uL   nRBC 0.0 0.0 - 0.2 %   Neutrophils Relative % 72 %   Neutro Abs 6.5 1.7 - 7.7 K/uL   Lymphocytes Relative 18 %   Lymphs Abs 1.6 0.7 - 4.0 K/uL   Monocytes Relative 8 %   Monocytes Absolute 0.8 0.1 - 1.0 K/uL   Eosinophils Relative 1 %   Eosinophils Absolute 0.1 0.0 - 0.5 K/uL   Basophils Relative 1 %   Basophils Absolute 0.1 0.0 - 0.1 K/uL   Immature Granulocytes 0 %   Abs Immature Granulocytes 0.04 0.00 - 0.07 K/uL    Comment: Performed at [facility], [address], [place], [place] [address]  Resp Panel by RT-PCR (Flu A&B, Covid) Nasopharyngeal Swab     Status: None   Collection Time: 09/27/20  1:50 PM   Specimen: Nasopharyngeal Swab; Nasopharyngeal(NP) swabs in vial transport medium  Result Value Ref Range   SARS Coronavirus 2 by RT PCR NEGATIVE NEGATIVE    Comment: (NOTE) SARS-CoV-2 target nucleic acids are NOT DETECTED.  The  SARS-CoV-2 RNA is generally detectable in upper respiratory specimens during the acute phase of infection. The lowest concentration of SARS-CoV-2 viral copies this assay can detect is 138 copies/mL. A negative result does not preclude SARS-Cov-2 infection and should not be used as the sole basis for treatment or other patient management decisions. A negative result may occur with  improper specimen collection/handling, submission of specimen other than nasopharyngeal swab, presence of viral mutation(s) within the areas targeted by this assay, and inadequate number of viral copies(<138 copies/mL). A negative result must be combined with clinical observations, patient history, and epidemiological information. The expected result is Negative.  Fact Sheet for Patients:  EntrepreneurPulse.com.au  Fact Sheet for Healthcare Providers:  IncredibleEmployment.be  This test is no t yet approved or cleared by the [place] FDA and  has been authorized for detection and/or diagnosis of SARS-CoV-2 by FDA under an Emergency Use Authorization (EUA). This EUA will remain  in effect (meaning this test can be used) for the duration of the COVID-19 declaration under Section 564(b)(1) of the Act, 21 U.S.C.section 360bbb-3(b)(1), unless the authorization is terminated  or revoked sooner.       Influenza A by PCR NEGATIVE NEGATIVE   Influenza B by PCR NEGATIVE NEGATIVE    Comment: (NOTE) The Xpert Xpress SARS-CoV-2/FLU/RSV plus assay is intended as an aid in the diagnosis of influenza from Nasopharyngeal swab specimens and should not be used as a sole basis for treatment. Nasal washings and aspirates are unacceptable for Xpert Xpress SARS-CoV-2/FLU/RSV testing.  Fact Sheet for Patients: EntrepreneurPulse.com.au  Fact Sheet for Healthcare Providers: IncredibleEmployment.be  This test is not yet approved or cleared by the  [place] FDA and has been authorized for detection and/or diagnosis of SARS-CoV-2 by FDA under an Emergency Use Authorization (EUA). This EUA will remain in effect (meaning this test can be used) for the duration of the COVID-19 declaration under Section 564(b)(1) of the Act, 21 U.S.C. section 360bbb-3(b)(1), unless the authorization is terminated or revoked.  Performed at [facility], [address], [place], [place] [address]   CBC     Status: Abnormal   Collection Time: 09/27/20  3:27 PM  Result Value Ref Range   WBC 8.3 4.0 - 10.5 K/uL   RBC 3.03 (L) 3.87 - 5.11 MIL/uL   Hemoglobin 9.2 (L)  12.0 - 15.0 g/dL   HCT 28.5 (L) 36.0 - 46.0 %   MCV 94.1 80.0 - 100.0 fL   MCH 30.4 26.0 - 34.0 pg   MCHC 32.3 30.0 - 36.0 g/dL   RDW 13.7 11.5 - 15.5 %   Platelets 134 (L) 150 - 400 K/uL   nRBC 0.0 0.0 - 0.2 %    Comment: Performed at [facility], [address], [place], [place] [address]  Creatinine, serum     Status: Abnormal   Collection Time: 09/27/20  3:27 PM  Result Value Ref Range   Creatinine, Ser 2.34 (H) 0.44 - 1.00 mg/dL   GFR, Estimated 21 (L) >60 mL/min    Comment: (NOTE) Calculated using the CKD-EPI Creatinine Equation (2021) Performed at [facility], [address], [place], [name] [address]   TSH     Status: None   Collection Time: 09/27/20  3:27 PM  Result Value Ref Range   TSH 1.189 0.350 - 4.500 uIU/mL    Comment: Performed by a 3rd Generation assay with a functional sensitivity of <=0.01 uIU/mL. Performed at [facility], [address] [place], [place] [address]   Hemoglobin A1c     Status: Abnormal   Collection Time: 09/27/20  3:27 PM  Result Value Ref Range   Hgb A1c MFr Bld 7.6 (H) 4.8 - 5.6 %    Comment: (NOTE) Pre diabetes:          5.7%-6.4%  Diabetes:              >6.4%  Glycemic control for   <7.0% adults with diabetes    Mean Plasma Glucose 171.42 mg/dL    Comment: Performed at [facility] [address], [place], [place]  [address]  Glucose, capillary     Status: Abnormal   Collection Time: 09/27/20  4:27 PM  Result Value Ref Range   Glucose-Capillary 111 (H) 70 - 99 mg/dL    Comment: Glucose reference range applies only to samples taken after fasting for at least 8 hours.  Urinalysis, Routine w reflex microscopic Urine, Clean Catch     Status: Abnormal   Collection Time: 09/27/20  7:31 PM  Result Value Ref Range   Color, Urine STRAW (A) YELLOW   APPearance CLEAR CLEAR   Specific Gravity, Urine 1.009 1.005 - 1.030   pH 5.0 5.0 - 8.0   Glucose, UA NEGATIVE NEGATIVE mg/dL   Hgb urine dipstick SMALL (A) NEGATIVE   Bilirubin Urine NEGATIVE NEGATIVE   Ketones, ur NEGATIVE NEGATIVE mg/dL   Protein, ur NEGATIVE NEGATIVE mg/dL   Nitrite NEGATIVE NEGATIVE   Leukocytes,Ua NEGATIVE NEGATIVE   RBC / HPF 0-5 0 - 5 RBC/hpf   WBC, UA 0-5 0 - 5 WBC/hpf   Bacteria, UA NONE SEEN NONE SEEN   Hyaline Casts, UA PRESENT     Comment: Performed at [facility], [address], [place], [place] [address]  Glucose, capillary     Status: Abnormal   Collection Time: 09/27/20 10:27 PM  Result Value Ref Range   Glucose-Capillary 128 (H) 70 - 99 mg/dL    Comment: Glucose reference range applies only to samples taken after fasting for at least 8 hours.  Magnesium     Status: None   Collection Time: 09/28/20  5:07 AM  Result Value Ref Range   Magnesium 1.9 1.7 - 2.4 mg/dL    Comment: Performed at [facility], [address], [place], [place] [address]  Basic metabolic panel  Status: Abnormal   Collection Time: 09/28/20  5:07 AM  Result Value Ref Range   Sodium 139 135 - 145 mmol/L   Potassium 4.0 3.5 - 5.1 mmol/L   Chloride 108 98 - 111 mmol/L   CO2 25 22 - 32 mmol/L   Glucose, Bld 107 (H) 70 - 99 mg/dL    Comment: Glucose reference range applies only to samples taken after fasting for at least 8 hours.   BUN 48 (H) 8 - 23 mg/dL   Creatinine, Ser 1.82 (H) 0.44 - 1.00 mg/dL   Calcium 8.4 (L) 8.9 - 10.3 mg/dL   GFR,  Estimated 29 (L) >60 mL/min    Comment: (NOTE) Calculated using the CKD-EPI Creatinine Equation (2021)    Anion gap 6 5 - 15    Comment: Performed at [facility], [address], [place], [place] [address]  CBC     Status: Abnormal   Collection Time: 09/28/20  5:07 AM  Result Value Ref Range   WBC 7.7 4.0 - 10.5 K/uL   RBC 2.97 (L) 3.87 - 5.11 MIL/uL   Hemoglobin 9.0 (L) 12.0 - 15.0 g/dL   HCT 27.8 (L) 36.0 - 46.0 %   MCV 93.6 80.0 - 100.0 fL   MCH 30.3 26.0 - 34.0 pg   MCHC 32.4 30.0 - 36.0 g/dL   RDW 13.5 11.5 - 15.5 %   Platelets 145 (L) 150 - 400 K/uL   nRBC 0.0 0.0 - 0.2 %    Comment: Performed at [facility], [address], [place], [place] [address]  Vitamin B12     Status: Abnormal   Collection Time: 09/28/20  5:07 AM  Result Value Ref Range   Vitamin B-12 171 (L) 180 - 914 pg/mL    Comment: (NOTE) This assay is not validated for testing neonatal or myeloproliferative syndrome specimens for Vitamin B12 levels. Performed at [facility], [address], [place], [place] [address]   Folate, serum, performed at [facility] lab     Status: Abnormal   Collection Time: 09/28/20  5:07 AM  Result Value Ref Range   Folate 5.3 (L) >5.9 ng/mL    Comment: Performed at [facility], [address], [place], [place] [address]  Glucose, capillary     Status: Abnormal   Collection Time: 09/28/20  8:09 AM  Result Value Ref Range   Glucose-Capillary 110 (H) 70 - 99 mg/dL    Comment: Glucose reference range applies only to samples taken after fasting for at least 8 hours.  Glucose, capillary     Status: None   Collection Time: 09/28/20 11:46 AM  Result Value Ref Range   Glucose-Capillary 79 70 - 99 mg/dL    Comment: Glucose reference range applies only to samples taken after fasting for at least 8 hours.  Glucose, capillary     Status: Abnormal   Collection Time: 09/28/20  4:02 PM  Result Value Ref Range   Glucose-Capillary 160 (H) 70 - 99 mg/dL    Comment: Glucose reference range  applies only to samples taken after fasting for at least 8 hours.   Comment 1 Notify RN    Comment 2 Document in Chart   Glucose, capillary     Status: None   Collection Time: 09/28/20  9:43 PM  Result Value Ref Range   Glucose-Capillary 90 70 - 99 mg/dL    Comment: Glucose reference range applies only to samples taken after fasting for at least 8 hours.   Comment 1  Notify RN    Comment 2 Document in Chart   Basic metabolic panel     Status: Abnormal   Collection Time: 09/29/20  6:03 AM  Result Value Ref Range   Sodium 136 135 - 145 mmol/L   Potassium 4.2 3.5 - 5.1 mmol/L   Chloride 105 98 - 111 mmol/L   CO2 22 22 - 32 mmol/L   Glucose, Bld 172 (H) 70 - 99 mg/dL    Comment: Glucose reference range applies only to samples taken after fasting for at least 8 hours.   BUN 36 (H) 8 - 23 mg/dL   Creatinine, Ser 1.35 (H) 0.44 - 1.00 mg/dL   Calcium 8.5 (L) 8.9 - 10.3 mg/dL   GFR, Estimated 41 (L) >60 mL/min    Comment: (NOTE) Calculated using the CKD-EPI Creatinine Equation (2021)    Anion gap 9 5 - 15    Comment: Performed at [facility], [address] [place], [place] [address]  Magnesium     Status: None   Collection Time: 09/29/20  6:03 AM  Result Value Ref Range   Magnesium 1.8 1.7 - 2.4 mg/dL    Comment: Performed at [facility], [address], [place], [place] [address]  Glucose, capillary     Status: Abnormal   Collection Time: 09/29/20  7:51 AM  Result Value Ref Range   Glucose-Capillary 168 (H) 70 - 99 mg/dL    Comment: Glucose reference range applies only to samples taken after fasting for at least 8 hours.    CT Head Wo Contrast  Result Date: 09/27/2020 CLINICAL DATA:  Altered mental status. EXAM: CT HEAD WITHOUT CONTRAST TECHNIQUE: Contiguous axial images were obtained from the base of the skull through the vertex without intravenous contrast. COMPARISON:  08/20/2019 FINDINGS: Brain: Stable mildly enlarged ventricles and cortical sulci. Stable mild patchy white  matter low density in both cerebral hemispheres. No intracranial hemorrhage, mass lesion or CT evidence of acute infarction. Vascular: No hyperdense vessel or unexpected calcification. Skull: Mild bilateral hyperostosis frontalis. Sinuses/Orbits: Unremarkable. Other: None. IMPRESSION: No acute abnormality. Electronically Signed   By: Claudie Revering M.D.   On: 09/27/2020 12:55   MR BRAIN WO CONTRAST  Result Date: 09/28/2020 CLINICAL DATA:  Altered mental status EXAM: MRI HEAD WITHOUT CONTRAST TECHNIQUE: Multiplanar, multiecho pulse sequences of the brain and surrounding structures were obtained without intravenous contrast. COMPARISON:  08/20/2019 FINDINGS: Motion artifact is present. Brain: There is no acute infarction or intracranial hemorrhage. There is no intracranial mass, mass effect, or edema. There is no hydrocephalus or extra-axial fluid collection. Ventricles and sulci are prominent reflecting generalized parenchymal volume loss. Chronic small vessel infarcts of the left basal ganglia. Additional patchy foci of T2 hyperintensity in the supratentorial white matter nonspecific but may reflect mild chronic microvascular ischemic changes. Vascular: Major vessel flow voids at the skull base are preserved. Skull and upper cervical spine: Normal marrow signal is preserved. Sinuses/Orbits: Paranasal sinuses are aerated. Orbits are unremarkable. Other: Sella is unremarkable.  Mastoid air cells are clear. IMPRESSION: No evidence of recent infarction, hemorrhage, or mass. Chronic left basal ganglia infarcts. Mild chronic microvascular ischemic changes. Electronically Signed   By: Macy Mis M.D.   On: 09/28/2020 12:46   MR CERVICAL SPINE WO CONTRAST  Result Date: 09/28/2020 CLINICAL DATA:  Altered mental status EXAM: MRI CERVICAL SPINE WITHOUT CONTRAST TECHNIQUE: Multiplanar, multisequence MR imaging of the cervical spine was performed. No intravenous contrast was administered. COMPARISON:  None. FINDINGS:  Markedly motion degraded.  No definite high-grade canal narrowing. IMPRESSION: Markedly motion degraded studies essentially nondiagnostic. There is no high-grade canal narrowing. Electronically Signed   By: Macy Mis M.D.   On: 09/28/2020 12:49   DG Chest Port 1 View  Result Date: 09/27/2020 CLINICAL DATA:  Altered mental status.  History of dementia. EXAM: PORTABLE CHEST 1 VIEW COMPARISON:  Radiographs 01/22/2019.  CT 08/25/2017. FINDINGS: 1234 hours. The heart size and mediastinal contours are stable with aortic atherosclerosis and great vessel tortuosity. The lungs appear clear. There is no pleural effusion or pneumothorax. The bones appear unremarkable. Telemetry leads overlie the chest. IMPRESSION: Stable chest.  No active cardiopulmonary process. Electronically Signed   By: Richardean Sale M.D.   On: 09/27/2020 12:47   DG Knee Complete 4 Views Left  Result Date: 09/27/2020 CLINICAL DATA:  Knee pain EXAM: LEFT KNEE - COMPLETE 4+ VIEW COMPARISON:  Knee radiographs dated 01/23/2013. FINDINGS: No evidence of fracture or dislocation. There is a small to moderate knee joint effusion. Moderate tricompartmental osteoarthritis is noted. IMPRESSION: Small to moderate knee joint effusion without acute osseous injury. Chronic degenerative changes. Electronically Signed   By: Zerita Boers M.D.   On: 09/27/2020 13:28   DG Knee Complete 4 Views Right  Result Date: 09/27/2020 CLINICAL DATA:  Knee pain EXAM: RIGHT KNEE - COMPLETE 4+ VIEW COMPARISON:  None. FINDINGS: No evidence of fracture or dislocation. There is a small to moderate knee joint effusion. Moderate tricompartmental osteoarthritis is noted. IMPRESSION: Small to moderate knee joint effusion without acute osseous injury. Chronic degenerative changes. Electronically Signed   By: Zerita Boers M.D.   On: 09/27/2020 13:28   DG Foot Complete Right  Result Date: 09/28/2020 CLINICAL DATA:  Right foot pain. EXAM: RIGHT FOOT COMPLETE - 3+ VIEW COMPARISON:   None. FINDINGS: Technically difficult positioning due to limited mobility. Possible small avulsion type fracture involving the dorsal midfoot in the region of the talar cuneiform articulation. Alternatively this may be chronic. No other fracture of the foot. Mild hammertoe deformity of the digits. Mild midfoot degenerative spurring. Questionable soft tissue edema. IMPRESSION: Possible small avulsion type fracture involving the dorsal midfoot in the region of the talar cuneiform articulation, may be chronic, however recommend correlation with point tenderness. No other fracture of the foot. Electronically Signed   By: Keith Rake M.D.   On: 09/28/2020 19:41    Review of Systems  All other systems reviewed and are negative.  Blood pressure (!) 151/64, pulse 76, temperature (!) 100.6 F (38.1 C), temperature source Oral, resp. rate 20, height 5\' 1"  (1.549 m), weight 96.6 kg, SpO2 97 %. Physical Exam Constitutional:      General: She is sleeping.     Appearance: Normal appearance.  Cardiovascular:     Rate and Rhythm: Normal rate and regular rhythm.  Musculoskeletal:     Right foot: Normal range of motion and normal capillary refill. Swelling, tenderness and bony tenderness present. No deformity, foot drop or laceration. Normal pulse.     Comments: Right ankle stable drawer test and inversion test  Neurological:     Mental Status: She is lethargic.     Assessment/Plan: Probable avulsion fracture of the talonavicular joint nondisplaced recommend cam walker preferably short 6 weeks then x-ray in 6 weeks in the office  Weight-bear as tolerated  Arther Abbott 09/29/2020, 10:58 AM

## 2020-09-29 NOTE — Plan of Care (Signed)
  Problem: Acute Rehab OT Goals (only OT should resolve) Goal: Pt. Will Perform Grooming Flowsheets (Taken 09/29/2020 1223) Pt Will Perform Grooming:  standing  with mod assist Goal: Pt. Will Perform Upper Body Dressing Flowsheets (Taken 09/29/2020 1223) Pt Will Perform Upper Body Dressing:  with supervision  sitting Goal: Pt. Will Perform Lower Body Dressing Flowsheets (Taken 09/29/2020 1223) Pt Will Perform Lower Body Dressing:  with mod assist  sitting/lateral leans  sit to/from stand  with adaptive equipment Goal: Pt. Will Transfer To Toilet Flowsheets (Taken 09/29/2020 1223) Pt Will Transfer to Toilet:  with min assist  with mod assist  stand pivot transfer  grab bars Goal: Pt/Caregiver Will Perform Home Exercise Program Flowsheets (Taken 09/29/2020 1223) Pt/caregiver will Perform Home Exercise Program:  Increased ROM  Both right and left upper extremity  With minimal assist  Increased strength  Raziel Koenigs OT, MOT

## 2020-09-30 ENCOUNTER — Encounter: Payer: Self-pay | Admitting: Adult Health

## 2020-09-30 ENCOUNTER — Non-Acute Institutional Stay (SKILLED_NURSING_FACILITY): Payer: PPO | Admitting: Adult Health

## 2020-09-30 DIAGNOSIS — E1122 Type 2 diabetes mellitus with diabetic chronic kidney disease: Secondary | ICD-10-CM

## 2020-09-30 DIAGNOSIS — G301 Alzheimer's disease with late onset: Secondary | ICD-10-CM | POA: Diagnosis not present

## 2020-09-30 DIAGNOSIS — E538 Deficiency of other specified B group vitamins: Secondary | ICD-10-CM | POA: Diagnosis not present

## 2020-09-30 DIAGNOSIS — E1149 Type 2 diabetes mellitus with other diabetic neurological complication: Secondary | ICD-10-CM | POA: Diagnosis not present

## 2020-09-30 DIAGNOSIS — M170 Bilateral primary osteoarthritis of knee: Secondary | ICD-10-CM

## 2020-09-30 DIAGNOSIS — F339 Major depressive disorder, recurrent, unspecified: Secondary | ICD-10-CM | POA: Insufficient documentation

## 2020-09-30 DIAGNOSIS — E785 Hyperlipidemia, unspecified: Secondary | ICD-10-CM

## 2020-09-30 DIAGNOSIS — N183 Chronic kidney disease, stage 3 unspecified: Secondary | ICD-10-CM | POA: Insufficient documentation

## 2020-09-30 DIAGNOSIS — N1832 Chronic kidney disease, stage 3b: Secondary | ICD-10-CM | POA: Insufficient documentation

## 2020-09-30 DIAGNOSIS — E039 Hypothyroidism, unspecified: Secondary | ICD-10-CM | POA: Diagnosis not present

## 2020-09-30 DIAGNOSIS — D638 Anemia in other chronic diseases classified elsewhere: Secondary | ICD-10-CM

## 2020-09-30 DIAGNOSIS — Z8673 Personal history of transient ischemic attack (TIA), and cerebral infarction without residual deficits: Secondary | ICD-10-CM

## 2020-09-30 DIAGNOSIS — F0281 Dementia in other diseases classified elsewhere with behavioral disturbance: Secondary | ICD-10-CM

## 2020-09-30 DIAGNOSIS — Z794 Long term (current) use of insulin: Secondary | ICD-10-CM

## 2020-09-30 DIAGNOSIS — F0390 Unspecified dementia without behavioral disturbance: Secondary | ICD-10-CM | POA: Diagnosis not present

## 2020-09-30 DIAGNOSIS — E0869 Diabetes mellitus due to underlying condition with other specified complication: Secondary | ICD-10-CM

## 2020-09-30 DIAGNOSIS — R600 Localized edema: Secondary | ICD-10-CM

## 2020-09-30 DIAGNOSIS — I129 Hypertensive chronic kidney disease with stage 1 through stage 4 chronic kidney disease, or unspecified chronic kidney disease: Secondary | ICD-10-CM

## 2020-09-30 DIAGNOSIS — E1169 Type 2 diabetes mellitus with other specified complication: Secondary | ICD-10-CM | POA: Diagnosis not present

## 2020-09-30 DIAGNOSIS — F02818 Dementia in other diseases classified elsewhere, unspecified severity, with other behavioral disturbance: Secondary | ICD-10-CM | POA: Insufficient documentation

## 2020-09-30 DIAGNOSIS — G309 Alzheimer's disease, unspecified: Secondary | ICD-10-CM | POA: Insufficient documentation

## 2020-09-30 NOTE — Progress Notes (Signed)
Location:  [facility] Room Number: 151- P Place of Service:  SNF (31)   CODE STATUS: DNR  No Known Allergies  Chief Complaint  Patient presents with  . Hospitalization Follow-up    Follow-up from recent hospital stay     HPI:  She is a 75 year old woman who has been hospitalized from 09-27-20 through 09-29-20. Her medical history includes: dementia; cva; diabetes; hypertension. She presented to the ED with increased weakness and confusion. She had had a fall in shower; was found to have a right foot avulsion fracture is in [place].  Her workup was essentially negative for uti. She was treated for acute renal failure. She is here for short term rehab with her goal to return home with her family. She denies any pain; states that she slept well lastnight; on changes in appetite . She will continue to be followed for her chronic illnesses including: Hypertension associated with stage 3b chronic kidney disease due to type 2 diabetes mellitus:    Stage 3 chronic kidney disease associated with type 2 diabetes mellitus:     Hyperlipidemia associated with type 2 diabetes mellitus:. Controlled type 2 diabetes with other neurological complication with long term current use of insulin  Past Medical History:  Diagnosis Date  . Alzheimer's dementia ([place]) 2015  . Cancer ([place])   . Diabetes ([place])   . Hypertension     Past Surgical History:  Procedure Laterality Date  . ABDOMINAL HYSTERECTOMY    . COLONOSCOPY N/A 05/14/2015   Procedure: COLONOSCOPY;  Surgeon: Rogene Houston, MD;  Location: [facility];  Service: Endoscopy;  Laterality: N/A;  730  . OOPHORECTOMY      Social History   Socioeconomic History  . Marital status: Married    Spouse name: Not on file  . Number of children: Not on file  . Years of education: Not on file  . Highest education level: Not on file  Occupational History  . Not on file  Tobacco Use  . Smoking status: Former Smoker    Packs/day:  0.50    Years: 10.00    Pack years: 5.00    Types: Cigarettes    Quit date: 05/22/2004    Years since quitting: 16.3  . Smokeless tobacco: Never Used  Vaping Use  . Vaping Use: Never used  Substance and Sexual Activity  . Alcohol use: Yes    Alcohol/week: 0.0 standard drinks    Comment: social  . Drug use: No  . Sexual activity: Never  Other Topics Concern  . Not on file  Social History Narrative  . Not on file   Social Determinants of Health   Financial Resource Strain: Not on file  Food Insecurity: Not on file  Transportation Needs: Not on file  Physical Activity: Not on file  Stress: Not on file  Social Connections: Not on file  Intimate Partner Violence: Not on file   Family History  Problem Relation Age of Onset  . Rheum arthritis Mother   . Heart failure Mother   . Hypertension Mother   . Stroke Father   . Diabetes Brother   . Diabetes Sister   . Hypertension Maternal Grandmother       VITAL SIGNS BP 140/80   Pulse 68   Temp (!) 97.3 F (36.3 C)   Resp 18   Ht 5\' 1"  (1.549 m)   Wt 224 lb (101.6 kg)   BMI 42.32 kg/m   Outpatient Encounter Medications  as of 09/30/2020  Medication Sig  . acetaminophen (TYLENOL) 325 MG tablet Take 2 tablets (650 mg total) by mouth every 6 (six) hours as needed for mild pain, fever or headache (or Fever >/= 101).  Marland Kitchen amLODipine (NORVASC) 5 MG tablet Take 5 mg by mouth daily.   Marland Kitchen aspirin EC 325 MG tablet Take 1 tablet (325 mg total) by mouth daily.  Marland Kitchen atorvastatin (LIPITOR) 40 MG tablet Take 1 tablet (40 mg total) by mouth every morning.  . donepezil (ARICEPT) 10 MG tablet Take 10 mg by mouth at bedtime.   . ferrous sulfate 325 (65 FE) MG tablet Take 325 mg by mouth daily with breakfast.  . folic acid (FOLVITE) 1 MG tablet Take 1 tablet (1 mg total) by mouth daily.  . furosemide (LASIX) 20 MG tablet Take 40 mg by mouth every morning.   . labetalol (NORMODYNE) 200 MG tablet Take 400 mg by mouth 2 (two) times daily.  Marland Kitchen  LANTUS SOLOSTAR 100 UNIT/ML Solostar Pen Inject 40 Units into the skin at bedtime.  Marland Kitchen levothyroxine (SYNTHROID) 150 MCG tablet Take 150 mcg by mouth every morning.  Marland Kitchen lisinopril (ZESTRIL) 5 MG tablet Take 5 mg by mouth daily.  Ernestine Conrad 3-6-9 Fatty Acids (OMEGA DHA PO) Take 1 tablet by mouth daily. [phone] mg  . QUEtiapine (SEROQUEL) 25 MG tablet Take 25 mg by mouth every morning.   . sertraline (ZOLOFT) 100 MG tablet Take 100 mg by mouth every morning.   Marland Kitchen UNABLE TO FIND Diet - NAS, ConCHO  . vitamin B-12 (CYANOCOBALAMIN) 1000 MCG tablet Take 1 tablet (1,000 mcg total) by mouth daily.  . [DISCONTINUED] labetalol (NORMODYNE) 200 MG tablet Take 400 mg by mouth 2 (two) times daily.  . [DISCONTINUED] Omega 3 1000 MG CAPS Take 1 capsule by mouth daily.   No facility-administered encounter medications on file as of 09/30/2020.     SIGNIFICANT DIAGNOSTIC EXAMS  TODAY  09-27-20: chest x-ray:  Stable chest. No active cardiopulmonary process.  09-27-20: ct of head: No acute abnormality.   09-28-20: MRI brain:  No evidence of recent infarction, hemorrhage, or mass. Chronic left basal ganglia infarcts. Mild chronic microvascular ischemic changes.  09-28-20: MRI cervical spine:  Markedly motion degraded studies essentially nondiagnostic. There is no high-grade canal narrowing  09-28-20: right foot x-ray:  Possible small avulsion type fracture involving the dorsal midfoot in the region of the talar cuneiform articulation, may be chronic, however recommend correlation with point tenderness. No other fracture of the foot.   LABS REVIEWED TODAY;   09-27-20: wbc 8.3; hgb 9.2; hct 28.5; mcv 94.1 plt 134; glucose 177; bun 56; creat 2.49; k+ 4.2; na++ 137; ca 8.8; GFR 20; liver normal albumin 3.5 tsh 1.189; hgb a1c 7.6 09-28-20: vitamin B 12: 171 09-29-20: glucose 172; bun 36; creat 1.35; k+ 4.2; na++ 136; ca 8.5 GFR 41   Review of Systems  Unable to perform ROS: Dementia (unable to fully participate )     Physical Exam Constitutional:      General: She is not in acute distress.    Appearance: She is well-developed. She is obese. She is not diaphoretic.  HENT:     Nose: Nose normal.     Mouth/Throat:     Pharynx: Oropharynx is clear.  Eyes:     Conjunctiva/sclera: Conjunctivae normal.  Neck:     Thyroid: No thyromegaly.  Cardiovascular:     Rate and Rhythm: Normal rate and regular rhythm.     Pulses:  Normal pulses.     Heart sounds: Normal heart sounds.  Pulmonary:     Effort: Pulmonary effort is normal. No respiratory distress.     Breath sounds: Normal breath sounds.  Abdominal:     General: Bowel sounds are normal. There is no distension.     Palpations: Abdomen is soft.     Tenderness: There is no abdominal tenderness.  Musculoskeletal:        General: Normal range of motion.     Cervical back: Neck supple.     Right lower leg: No edema.     Left lower leg: No edema.     Comments: Right foot in cam boot   Lymphadenopathy:     Cervical: No cervical adenopathy.  Skin:    General: Skin is warm and dry.  Neurological:     Mental Status: She is alert. Mental status is at baseline.  Psychiatric:        Mood and Affect: Mood normal.       ASSESSMENT/ PLAN:  TODAY  1. Hypertension associated with stage 3b chronic kidney disease due to type 2 diabetes mellitus: is stable b/p 140/80: will continue norvasc 5 mg daily ; lisinopril 5 mg daily labetalol 400 mg twice daily asa 325 mg daily   2. Stage 3 chronic kidney disease associated with type 2 diabetes mellitus: is without change bun 36; creat 1.35; GFR 41 will restart calcitriol 0.25 mcg three days weekly   3. Hyperlipidemia associated with type 2 diabetes mellitus: is stable will continue lipitor 40 mg daily   4. Controlled type 2 diabetes with other neurological complication with long term current use of insulin: hgb a1c 7.6; will continue lantus 40 units nightly     Is on ace; statin; asa   5. Primary  osteoarthritis of bilateral knees: per family is not a surgical candidate; will continue to monitor and will treat as indicated  6. Acquired hypothyroidism: tsh 1.189;  Will continue synthroid 150 mcg daily   7. Major depression recurrent chronic: is stable will continue zoloft 100 mg daily   8. Vitamin B 12 deficiency: level is 171; will stop oral supplement; will begin weekly B12 injections for 4 weeks then will repeat level will treat further as indicated. Will continue folic acid 1 mg daily   9. Bilateral lower extremity edema; is stable will continue lasix 40 mg daily   10. Anemia associated with type 2 diabetes mellitus due to underlying condition: is without change hgb 9.2 will continue iron daily   11. Psychosis in elderly with behavioral disturbance: is without change family states that she can become aggressive at times; will change seroquel 25 mg to evening hours  12. History of CVA: is stable will continue asa 325 mg daily   13. Late onset alzheimer's disease with behavioral disturbance: is without change; weight is 224 pounds; will continue aricept 10 mg nightly will begin namenda xr titration: 7 mg 10-01-20 - 10-08-20; 14 mg 10-09-20 - 10-16-20; 21 mg: 10-17-20 - 10-24-20; 28 mg 10-25-20.   14. Right foot avulsion fracture: is in [place]; will follow up with orthopedics as indicated.    Total time spent with patient: coordination of medications; therapy and goals of care.     Ok Edwards NP [facility]  Contact [phone] Monday through Friday 8am- 5pm  After hours call [phone]

## 2020-10-02 ENCOUNTER — Non-Acute Institutional Stay (SKILLED_NURSING_FACILITY): Payer: PPO | Admitting: Internal Medicine

## 2020-10-02 ENCOUNTER — Encounter: Payer: Self-pay | Admitting: Internal Medicine

## 2020-10-02 DIAGNOSIS — I10 Essential (primary) hypertension: Secondary | ICD-10-CM | POA: Diagnosis not present

## 2020-10-02 DIAGNOSIS — G301 Alzheimer's disease with late onset: Secondary | ICD-10-CM | POA: Diagnosis not present

## 2020-10-02 DIAGNOSIS — F0281 Dementia in other diseases classified elsewhere with behavioral disturbance: Secondary | ICD-10-CM | POA: Diagnosis not present

## 2020-10-02 DIAGNOSIS — N179 Acute kidney failure, unspecified: Secondary | ICD-10-CM

## 2020-10-02 DIAGNOSIS — S92901S Unspecified fracture of right foot, sequela: Secondary | ICD-10-CM | POA: Diagnosis not present

## 2020-10-02 DIAGNOSIS — E1149 Type 2 diabetes mellitus with other diabetic neurological complication: Secondary | ICD-10-CM | POA: Diagnosis not present

## 2020-10-02 DIAGNOSIS — Z794 Long term (current) use of insulin: Secondary | ICD-10-CM | POA: Diagnosis not present

## 2020-10-02 DIAGNOSIS — E538 Deficiency of other specified B group vitamins: Secondary | ICD-10-CM | POA: Diagnosis not present

## 2020-10-02 NOTE — Progress Notes (Signed)
NURSING HOME LOCATION: [facility] ROOM NUMBER:  151-P  CODE STATUS:  DNR  PCP:  Carlean Jews. Collene Mares, PA-C  This is a comprehensive admission note to this SNFperformed on this date less than 30 days from date of admission. Included are preadmission medical/surgical history; reconciled medication list; family history; social history and comprehensive review of systems.  Corrections and additions to the records were documented. Comprehensive physical exam was also performed. Additionally a clinical summary was entered for each active diagnosis pertinent to this admission in the Problem List to enhance continuity of care.  HPI: She was hospitalized 5/8 - 09/29/2020, presenting to the ED with increased weakness and confusion.  Apparently she had fallen in the shower and sustained a right foot avulsion fracture.  Course was complicated by AKI superimposed on CKD stage IIIB which improved with fluid administration.  CNS MRI and MRI C-spine revealed no acute abnormalities.  She did have O16 and folic acid deficiencies for which supplementation was initiated. Fracture was addressed with a CAM boot. SNF placement for PT and OT was recommended.  Past medical and surgical history: Includes baseline dementia, history of stroke, essential hypertension, hypothyroidism, osteoarthritis, dyslipidemia, and diabetes with CKD and vascular disease. Surgeries and procedures include abdominal hysterectomy and oophorectomy as well as colonoscopies.  Social history: History of social alcohol intake; former history of minimal smoking.  Family history: Extensive history reviewed.   Review of systems: Clinical neurocognitive deficits made validity of responses questionable & prevented ROS completion.  She was unable to give the date ,even the year stating it was "19??".  POTUS response was "it ain't [employer]".  She denied any active symptoms except "I am too fat".  She confabulated about checking  her glucoses ,stating these had been "up to 500 @ times and once low enough to put me in the hospital".  She denies diabetic related symptoms or signs.  She stated that she actually been in the hospital because of her diabetes and was unaware of the ankle fracture. Despite the anemia and she denied any bleeding dyscrasias. No change in hair/skin/nails, excessive thirst, excessive hunger, excessive urination   Physical exam:  Pertinent or positive findings:Obesity is present. Ptosis is present on the left.  Dental hygiene is surprisingly good.  S1 is accentuated and followed by an intermittent short systolic murmur.  Pedal pulses are decreased.  The posterior tibial pulses clinically are stronger than the dorsalis pedis pulses.  There is trace-1/2+ edema at the sock line.  She has DIP changes of the hands in isolated fashion.  She has a coarse tremor of the right upper extremity.  General appearance: Adequately nourished; no acute distress, increased work of breathing is present.   Lymphatic: No lymphadenopathy about the head, neck, axilla. Eyes: No conjunctival inflammation or lid edema is present. There is no scleral icterus. Ears:  External ear exam shows no significant lesions or deformities.   Nose:  External nasal examination shows no deformity or inflammation. Nasal mucosa are pink and moist without lesions, exudates Oral exam: Lips and gums are healthy appearing.There is no oropharyngeal erythema or exudate. Neck:  No thyromegaly, masses, tenderness noted.    Heart:  No gallop, murmur, click, rub.  Lungs: Chest clear to auscultation without wheezes, rhonchi, rales, rubs. Abdomen: Bowel sounds are normal.  Abdomen is soft and nontender with no organomegaly, hernias, masses. GU: Deferred  Extremities:  No cyanosis, clubbing Neurologic exam: Balance, Rhomberg, finger to nose testing could not be completed  due to clinical state Skin: Warm & dry w/o tenting. No significant lesions or  rash.  See clinical summary under each active problem in the Problem List with associated updated therapeutic plan

## 2020-10-02 NOTE — Assessment & Plan Note (Addendum)
Current creatinine 1.35 & GFR 41; CKD Stage 3 B. Discussed with NP D/C of ACE-I  If CKD progresses

## 2020-10-02 NOTE — Assessment & Plan Note (Signed)
Current A1c 7.6% indicating adequate control. Critical is to avoid hypoglycemia which would mimic TIA

## 2020-10-02 NOTE — Assessment & Plan Note (Signed)
BP elevated on low dose ACE-I in context of CKD 3B. Add CCB & monitor BP.

## 2020-10-02 NOTE — Assessment & Plan Note (Signed)
B12 level 171; NP appropriately switched PO supplement to parenteral until reversed

## 2020-10-02 NOTE — Assessment & Plan Note (Signed)
PT/OT at SNF as tolerated. ?

## 2020-10-02 NOTE — Patient Instructions (Signed)
See assessment and plan under each diagnosis in the problem list and acutely for this visit 

## 2020-10-02 NOTE — Assessment & Plan Note (Signed)
On supplement for folate of 5.3 yet no protein /caloric malnutrition with normal total protein & albumin History states 2 ETOH daily  Nutrition monitor @ SNF

## 2020-10-02 NOTE — Assessment & Plan Note (Addendum)
Recent CT scan does not suggest vascular dementia. Today unable to give date, even year.She is unaware of ankle fracture. She smiles & laughs as she confabulates. B12 deficiency being addressed

## 2020-10-05 ENCOUNTER — Encounter: Payer: Self-pay | Admitting: Adult Health

## 2020-10-05 ENCOUNTER — Non-Acute Institutional Stay (SKILLED_NURSING_FACILITY): Payer: PPO | Admitting: Adult Health

## 2020-10-05 ENCOUNTER — Other Ambulatory Visit: Payer: Self-pay | Admitting: Adult Health

## 2020-10-05 DIAGNOSIS — E1149 Type 2 diabetes mellitus with other diabetic neurological complication: Secondary | ICD-10-CM

## 2020-10-05 DIAGNOSIS — F0281 Dementia in other diseases classified elsewhere with behavioral disturbance: Secondary | ICD-10-CM | POA: Diagnosis not present

## 2020-10-05 DIAGNOSIS — G301 Alzheimer's disease with late onset: Secondary | ICD-10-CM | POA: Diagnosis not present

## 2020-10-05 DIAGNOSIS — E1122 Type 2 diabetes mellitus with diabetic chronic kidney disease: Secondary | ICD-10-CM | POA: Diagnosis not present

## 2020-10-05 DIAGNOSIS — D638 Anemia in other chronic diseases classified elsewhere: Secondary | ICD-10-CM

## 2020-10-05 DIAGNOSIS — Z794 Long term (current) use of insulin: Secondary | ICD-10-CM

## 2020-10-05 DIAGNOSIS — E0869 Diabetes mellitus due to underlying condition with other specified complication: Secondary | ICD-10-CM

## 2020-10-05 DIAGNOSIS — N183 Chronic kidney disease, stage 3 unspecified: Secondary | ICD-10-CM | POA: Diagnosis not present

## 2020-10-05 MED ORDER — FERROUS SULFATE 325 (65 FE) MG PO TABS: 325.0000 mg | ORAL_TABLET | Freq: Every day | ORAL | 0 refills | Status: AC

## 2020-10-05 MED ORDER — FUROSEMIDE 40 MG PO TABS: 40.0000 mg | ORAL_TABLET | Freq: Every day | ORAL | 0 refills | Status: DC

## 2020-10-05 MED ORDER — FOLIC ACID 1 MG PO TABS: 1.0000 mg | ORAL_TABLET | Freq: Every day | ORAL | 0 refills | Status: AC

## 2020-10-05 MED ORDER — MEMANTINE HCL ER 7 MG PO CP24: 7.0000 mg | ORAL_CAPSULE | Freq: Every day | ORAL | 0 refills | Status: DC

## 2020-10-05 MED ORDER — LABETALOL HCL 200 MG PO TABS: 400.0000 mg | ORAL_TABLET | Freq: Two times a day (BID) | ORAL | 0 refills | Status: DC

## 2020-10-05 MED ORDER — LANTUS SOLOSTAR 100 UNIT/ML ~~LOC~~ SOPN: 40.0000 [IU] | PEN_INJECTOR | Freq: Every day | SUBCUTANEOUS | 0 refills | Status: DC

## 2020-10-05 MED ORDER — MEMANTINE HCL ER 14 MG PO CP24: 14.0000 mg | ORAL_CAPSULE | Freq: Every day | ORAL | 0 refills | Status: DC

## 2020-10-05 MED ORDER — DONEPEZIL HCL 10 MG PO TABS: 10.0000 mg | ORAL_TABLET | Freq: Every day | ORAL | 0 refills | Status: DC

## 2020-10-05 MED ORDER — ATORVASTATIN CALCIUM 40 MG PO TABS: 40.0000 mg | ORAL_TABLET | Freq: Every morning | ORAL | 0 refills | Status: AC

## 2020-10-05 MED ORDER — LEVOTHYROXINE SODIUM 150 MCG PO TABS: 150.0000 ug | ORAL_TABLET | Freq: Every morning | ORAL | 0 refills | Status: DC

## 2020-10-05 MED ORDER — QUETIAPINE FUMARATE 25 MG PO TABS: 25.0000 mg | ORAL_TABLET | ORAL | 0 refills | Status: DC

## 2020-10-05 MED ORDER — SERTRALINE HCL 100 MG PO TABS: 100.0000 mg | ORAL_TABLET | Freq: Every morning | ORAL | 0 refills | Status: DC

## 2020-10-05 MED ORDER — CALCITRIOL 0.25 MCG PO CAPS: 0.2500 ug | ORAL_CAPSULE | ORAL | 0 refills | Status: DC

## 2020-10-05 MED ORDER — MEMANTINE HCL ER 28 MG PO CP24: 28.0000 mg | ORAL_CAPSULE | Freq: Every day | ORAL | 0 refills | Status: DC

## 2020-10-05 MED ORDER — MEMANTINE HCL ER 21 MG PO CP24: 21.0000 mg | ORAL_CAPSULE | Freq: Every day | ORAL | 0 refills | Status: DC

## 2020-10-05 MED ORDER — AMLODIPINE BESYLATE 5 MG PO TABS: 5.0000 mg | ORAL_TABLET | Freq: Every day | ORAL | 0 refills | Status: DC

## 2020-10-05 MED ORDER — LISINOPRIL 5 MG PO TABS: 5.0000 mg | ORAL_TABLET | Freq: Every day | ORAL | 0 refills | Status: DC

## 2020-10-05 NOTE — Progress Notes (Signed)
Location:    [facility] Room Number: 151-P Place of Service:  SNF (31)    CODE STATUS: DNR  No Known Allergies  Chief Complaint  Patient presents with  . Discharge Note    Discharge to home    HPI:  She is being discharged to home with home health for pt/ot. She does not need any dme. She will need her prescriptions written and will need to follow up with her medical provider. She had been hospitalized for increased weakness and confusion; she was treated for acute renal failure and foot fracture. She was admitted to this facility for short term rehab. She has been started on namenda and remains on titration. Her family feels she will continue to gain strength and independence in her own home environment. She will complete her therapy on a home health basis.    Past Medical History:  Diagnosis Date  . Alzheimer's dementia ([place]) 2015  . Cancer ([place])   . Diabetes ([place])   . Hypertension     Past Surgical History:  Procedure Laterality Date  . ABDOMINAL HYSTERECTOMY    . COLONOSCOPY N/A 05/14/2015   Procedure: COLONOSCOPY;  Surgeon: Rogene Houston, MD;  Location[facility];  Service: Endoscopy;  Laterality: N/A;  730  . OOPHORECTOMY      Social History   Socioeconomic History  . Marital status: Married    Spouse name: Not on file  . Number of children: Not on file  . Years of education: Not on file  . Highest education level: Not on file  Occupational History  . Not on file  Tobacco Use  . Smoking status: Former Smoker    Packs/day: 0.50    Years: 10.00    Pack years: 5.00    Types: Cigarettes    Quit date: 05/22/2004    Years since quitting: 16.3  . Smokeless tobacco: Never Used  Vaping Use  . Vaping Use: Never used  Substance and Sexual Activity  . Alcohol use: Yes    Alcohol/week: 0.0 standard drinks    Comment: social  . Drug use: No  . Sexual activity: Never  Other Topics Concern  . Not on file  Social History Narrative  . Not on file    Social Determinants of Health   Financial Resource Strain: Not on file  Food Insecurity: Not on file  Transportation Needs: Not on file  Physical Activity: Not on file  Stress: Not on file  Social Connections: Not on file  Intimate Partner Violence: Not on file   Family History  Problem Relation Age of Onset  . Rheum arthritis Mother   . Heart failure Mother   . Hypertension Mother   . Stroke Father   . Diabetes Brother   . Diabetes Sister   . Hypertension Maternal Grandmother     VITAL SIGNS BP 118/70   Pulse 62   Temp 97.8 F (36.6 C)   Resp 20   SpO2 94%   Patient's Medications  New Prescriptions   No medications on file  Previous Medications   ACETAMINOPHEN (TYLENOL) 325 MG TABLET    Take 2 tablets (650 mg total) by mouth every 6 (six) hours as needed for mild pain, fever or headache (or Fever >/= 101).   AMLODIPINE (NORVASC) 5 MG TABLET    Take 5 mg by mouth daily.    ASPIRIN EC 325 MG TABLET    Take 1 tablet (325 mg total) by mouth daily.   ATORVASTATIN (LIPITOR)  40 MG TABLET    Take 1 tablet (40 mg total) by mouth every morning.   CALCITRIOL (ROCALTROL) 0.25 MCG CAPSULE    Take 0.25 mcg by mouth every other day. Once A Day on Mon, Wed, Fri, for stage 3b CKD   DONEPEZIL (ARICEPT) 10 MG TABLET    Take 10 mg by mouth at bedtime.    FERROUS SULFATE 325 (65 FE) MG TABLET    Take 325 mg by mouth daily with breakfast.   FOLIC ACID (FOLVITE) 1 MG TABLET    Take 1 tablet (1 mg total) by mouth daily.   FUROSEMIDE (LASIX) 40 MG TABLET    Take 40 mg by mouth daily.   LABETALOL (NORMODYNE) 200 MG TABLET    Take 400 mg by mouth 2 (two) times daily.   LANTUS SOLOSTAR 100 UNIT/ML SOLOSTAR PEN    Inject 40 Units into the skin at bedtime.   LEVOTHYROXINE (SYNTHROID) 150 MCG TABLET    Take 150 mcg by mouth every morning.   LISINOPRIL (ZESTRIL) 5 MG TABLET    Take 5 mg by mouth daily.   MEMANTINE HCL ER (NAMENDA XR TITRATION PACK) 7 & 14 & 21 &28 MG CP24    Take by mouth. 7 mg  09-26-20-10-08-20;  14 mg 10-09-20 - 10-16-20; 21 mg: 10-17-20 - 10-24-20; 28 mg 10-25-20   OMEGA 3-6-9 FATTY ACIDS (OMEGA DHA PO)    Take 1 tablet by mouth daily. [phone] mg   QUETIAPINE (SEROQUEL) 25 MG TABLET    Take 25 mg by mouth at bedtime.   SERTRALINE (ZOLOFT) 100 MG TABLET    Take 100 mg by mouth every morning.    UNABLE TO FIND    Diet - NAS, ConCHO   VITAMIN B-12 (CYANOCOBALAMIN) 1000 MCG TABLET    Take 1 tablet (1,000 mcg total) by mouth daily.  Modified Medications   No medications on file  Discontinued Medications   FUROSEMIDE (LASIX) 20 MG TABLET    Take 40 mg by mouth every morning.      SIGNIFICANT DIAGNOSTIC EXAMS  PREVIOUS   09-27-20: chest x-ray:  Stable chest. No active cardiopulmonary process.  09-27-20: ct of head: No acute abnormality.   09-28-20: MRI brain:  No evidence of recent infarction, hemorrhage, or mass. Chronic left basal ganglia infarcts. Mild chronic microvascular ischemic changes.  09-28-20: MRI cervical spine:  Markedly motion degraded studies essentially nondiagnostic. There is no high-grade canal narrowing  09-28-20: right foot x-ray:  Possible small avulsion type fracture involving the dorsal midfoot in the region of the talar cuneiform articulation, may be chronic, however recommend correlation with point tenderness. No other fracture of the foot.  NO NEW EXAMS.   LABS REVIEWED PREVIOUS   09-27-20: wbc 8.3; hgb 9.2; hct 28.5; mcv 94.1 plt 134; glucose 177; bun 56; creat 2.49; k+ 4.2; na++ 137; ca 8.8; GFR 20; liver normal albumin 3.5 tsh 1.189; hgb a1c 7.6 09-28-20: vitamin B 12: 171 09-29-20: glucose 172; bun 36; creat 1.35; k+ 4.2; na++ 136; ca 8.5 GFR 41  NO NEW LABS.   Review of Systems  Unable to perform ROS: Dementia (unable to fully participate )   Physical Exam Constitutional:      General: She is not in acute distress.    Appearance: She is well-developed. She is obese. She is not diaphoretic.  Neck:     Thyroid: No thyromegaly.   Cardiovascular:     Rate and Rhythm: Normal rate and regular rhythm.  Pulses: Normal pulses.     Heart sounds: Normal heart sounds.  Pulmonary:     Effort: Pulmonary effort is normal. No respiratory distress.     Breath sounds: Normal breath sounds.  Abdominal:     General: Bowel sounds are normal. There is no distension.     Palpations: Abdomen is soft.     Tenderness: There is no abdominal tenderness.  Musculoskeletal:        General: Normal range of motion.     Cervical back: Neck supple.     Right lower leg: No edema.     Left lower leg: No edema.     Comments: Right foot in cam boot   Lymphadenopathy:     Cervical: No cervical adenopathy.  Skin:    General: Skin is warm and dry.  Neurological:     Mental Status: She is alert. Mental status is at baseline.  Psychiatric:        Mood and Affect: Mood normal.       ASSESSMENT/ PLAN:   Patient is being discharged with the following home health services:  Pt/ot to evaluate and treat as indicated for gait balance strength adl training.   Patient is being discharged with the following durable medical equipment:  None needed   Patient has been advised to f/u with their PCP in 1-2 weeks to bring them up to date on their rehab stay.  Social services at facility was responsible for arranging this appointment.  Pt was provided with a 30 day supply of prescriptions for medications and refills must be obtained from their PCP.  For controlled substances, a more limited supply may be provided adequate until PCP appointment only.  A 30 day supply of her prescription medications have been sent to [facility]  Time spent with patient and family: 40 minutes: for medications; home health needs dme.    Ok Edwards NP [facility]  Contact [phone] Monday through Friday 8am- 5pm  After hours call [phone]

## 2020-10-06 DIAGNOSIS — E039 Hypothyroidism, unspecified: Secondary | ICD-10-CM | POA: Diagnosis not present

## 2020-10-06 DIAGNOSIS — M19041 Primary osteoarthritis, right hand: Secondary | ICD-10-CM | POA: Diagnosis not present

## 2020-10-06 DIAGNOSIS — E785 Hyperlipidemia, unspecified: Secondary | ICD-10-CM | POA: Diagnosis not present

## 2020-10-06 DIAGNOSIS — F0281 Dementia in other diseases classified elsewhere with behavioral disturbance: Secondary | ICD-10-CM | POA: Diagnosis not present

## 2020-10-06 DIAGNOSIS — M19042 Primary osteoarthritis, left hand: Secondary | ICD-10-CM | POA: Diagnosis not present

## 2020-10-06 DIAGNOSIS — N1832 Chronic kidney disease, stage 3b: Secondary | ICD-10-CM | POA: Diagnosis not present

## 2020-10-06 DIAGNOSIS — G301 Alzheimer's disease with late onset: Secondary | ICD-10-CM | POA: Diagnosis not present

## 2020-10-06 DIAGNOSIS — E1122 Type 2 diabetes mellitus with diabetic chronic kidney disease: Secondary | ICD-10-CM | POA: Diagnosis not present

## 2020-10-06 DIAGNOSIS — E538 Deficiency of other specified B group vitamins: Secondary | ICD-10-CM | POA: Diagnosis not present

## 2020-10-06 DIAGNOSIS — I10 Essential (primary) hypertension: Secondary | ICD-10-CM | POA: Diagnosis not present

## 2020-10-06 DIAGNOSIS — S92901D Unspecified fracture of right foot, subsequent encounter for fracture with routine healing: Secondary | ICD-10-CM | POA: Diagnosis not present

## 2020-10-06 DIAGNOSIS — E1149 Type 2 diabetes mellitus with other diabetic neurological complication: Secondary | ICD-10-CM | POA: Diagnosis not present

## 2020-10-06 DIAGNOSIS — E1159 Type 2 diabetes mellitus with other circulatory complications: Secondary | ICD-10-CM | POA: Diagnosis not present

## 2020-10-14 DIAGNOSIS — Z6838 Body mass index (BMI) 38.0-38.9, adult: Secondary | ICD-10-CM | POA: Diagnosis not present

## 2020-10-14 DIAGNOSIS — I639 Cerebral infarction, unspecified: Secondary | ICD-10-CM | POA: Diagnosis not present

## 2020-10-14 DIAGNOSIS — M6281 Muscle weakness (generalized): Secondary | ICD-10-CM | POA: Diagnosis not present

## 2020-10-14 DIAGNOSIS — F0390 Unspecified dementia without behavioral disturbance: Secondary | ICD-10-CM | POA: Diagnosis not present

## 2020-10-14 DIAGNOSIS — M79671 Pain in right foot: Secondary | ICD-10-CM | POA: Diagnosis not present

## 2020-10-14 DIAGNOSIS — R413 Other amnesia: Secondary | ICD-10-CM | POA: Diagnosis not present

## 2020-10-21 DIAGNOSIS — E1149 Type 2 diabetes mellitus with other diabetic neurological complication: Secondary | ICD-10-CM | POA: Diagnosis not present

## 2020-10-21 DIAGNOSIS — E039 Hypothyroidism, unspecified: Secondary | ICD-10-CM | POA: Diagnosis not present

## 2020-10-21 DIAGNOSIS — N1832 Chronic kidney disease, stage 3b: Secondary | ICD-10-CM | POA: Diagnosis not present

## 2020-10-21 DIAGNOSIS — I10 Essential (primary) hypertension: Secondary | ICD-10-CM | POA: Diagnosis not present

## 2020-10-21 DIAGNOSIS — F0281 Dementia in other diseases classified elsewhere with behavioral disturbance: Secondary | ICD-10-CM | POA: Diagnosis not present

## 2020-10-21 DIAGNOSIS — E785 Hyperlipidemia, unspecified: Secondary | ICD-10-CM | POA: Diagnosis not present

## 2020-10-21 DIAGNOSIS — E1159 Type 2 diabetes mellitus with other circulatory complications: Secondary | ICD-10-CM | POA: Diagnosis not present

## 2020-10-21 DIAGNOSIS — G301 Alzheimer's disease with late onset: Secondary | ICD-10-CM | POA: Diagnosis not present

## 2020-10-21 DIAGNOSIS — M19041 Primary osteoarthritis, right hand: Secondary | ICD-10-CM | POA: Diagnosis not present

## 2020-10-21 DIAGNOSIS — E1122 Type 2 diabetes mellitus with diabetic chronic kidney disease: Secondary | ICD-10-CM | POA: Diagnosis not present

## 2020-10-21 DIAGNOSIS — E538 Deficiency of other specified B group vitamins: Secondary | ICD-10-CM | POA: Diagnosis not present

## 2020-10-21 DIAGNOSIS — S92901D Unspecified fracture of right foot, subsequent encounter for fracture with routine healing: Secondary | ICD-10-CM | POA: Diagnosis not present

## 2020-10-21 DIAGNOSIS — M19042 Primary osteoarthritis, left hand: Secondary | ICD-10-CM | POA: Diagnosis not present

## 2020-10-28 ENCOUNTER — Ambulatory Visit: Payer: PPO | Admitting: Orthopedic Surgery

## 2020-11-11 DIAGNOSIS — I5032 Chronic diastolic (congestive) heart failure: Secondary | ICD-10-CM | POA: Diagnosis not present

## 2020-11-11 DIAGNOSIS — E1122 Type 2 diabetes mellitus with diabetic chronic kidney disease: Secondary | ICD-10-CM | POA: Diagnosis not present

## 2020-11-11 DIAGNOSIS — R809 Proteinuria, unspecified: Secondary | ICD-10-CM | POA: Diagnosis not present

## 2020-11-11 DIAGNOSIS — E211 Secondary hyperparathyroidism, not elsewhere classified: Secondary | ICD-10-CM | POA: Diagnosis not present

## 2020-11-11 DIAGNOSIS — N189 Chronic kidney disease, unspecified: Secondary | ICD-10-CM | POA: Diagnosis not present

## 2020-11-11 DIAGNOSIS — E1129 Type 2 diabetes mellitus with other diabetic kidney complication: Secondary | ICD-10-CM | POA: Diagnosis not present

## 2020-11-11 DIAGNOSIS — N281 Cyst of kidney, acquired: Secondary | ICD-10-CM | POA: Diagnosis not present

## 2020-11-11 DIAGNOSIS — D631 Anemia in chronic kidney disease: Secondary | ICD-10-CM | POA: Diagnosis not present

## 2020-11-12 DIAGNOSIS — N189 Chronic kidney disease, unspecified: Secondary | ICD-10-CM | POA: Diagnosis not present

## 2020-11-12 DIAGNOSIS — E1122 Type 2 diabetes mellitus with diabetic chronic kidney disease: Secondary | ICD-10-CM | POA: Diagnosis not present

## 2020-11-12 DIAGNOSIS — I5032 Chronic diastolic (congestive) heart failure: Secondary | ICD-10-CM | POA: Diagnosis not present

## 2020-11-12 DIAGNOSIS — E1129 Type 2 diabetes mellitus with other diabetic kidney complication: Secondary | ICD-10-CM | POA: Diagnosis not present

## 2020-11-12 DIAGNOSIS — D631 Anemia in chronic kidney disease: Secondary | ICD-10-CM | POA: Diagnosis not present

## 2020-11-12 DIAGNOSIS — N281 Cyst of kidney, acquired: Secondary | ICD-10-CM | POA: Diagnosis not present

## 2020-11-12 DIAGNOSIS — E211 Secondary hyperparathyroidism, not elsewhere classified: Secondary | ICD-10-CM | POA: Diagnosis not present

## 2020-11-12 DIAGNOSIS — R809 Proteinuria, unspecified: Secondary | ICD-10-CM | POA: Diagnosis not present

## 2020-11-17 DIAGNOSIS — E1129 Type 2 diabetes mellitus with other diabetic kidney complication: Secondary | ICD-10-CM | POA: Diagnosis not present

## 2020-11-17 DIAGNOSIS — E1122 Type 2 diabetes mellitus with diabetic chronic kidney disease: Secondary | ICD-10-CM | POA: Diagnosis not present

## 2020-11-17 DIAGNOSIS — I5032 Chronic diastolic (congestive) heart failure: Secondary | ICD-10-CM | POA: Diagnosis not present

## 2020-11-17 DIAGNOSIS — E211 Secondary hyperparathyroidism, not elsewhere classified: Secondary | ICD-10-CM | POA: Diagnosis not present

## 2020-11-17 DIAGNOSIS — N189 Chronic kidney disease, unspecified: Secondary | ICD-10-CM | POA: Diagnosis not present

## 2020-11-17 DIAGNOSIS — I129 Hypertensive chronic kidney disease with stage 1 through stage 4 chronic kidney disease, or unspecified chronic kidney disease: Secondary | ICD-10-CM | POA: Diagnosis not present

## 2020-11-17 DIAGNOSIS — R809 Proteinuria, unspecified: Secondary | ICD-10-CM | POA: Diagnosis not present

## 2020-11-17 DIAGNOSIS — D631 Anemia in chronic kidney disease: Secondary | ICD-10-CM | POA: Diagnosis not present

## 2020-11-19 DIAGNOSIS — E1122 Type 2 diabetes mellitus with diabetic chronic kidney disease: Secondary | ICD-10-CM | POA: Diagnosis not present

## 2020-11-19 DIAGNOSIS — I129 Hypertensive chronic kidney disease with stage 1 through stage 4 chronic kidney disease, or unspecified chronic kidney disease: Secondary | ICD-10-CM | POA: Diagnosis not present

## 2020-11-19 DIAGNOSIS — N183 Chronic kidney disease, stage 3 unspecified: Secondary | ICD-10-CM | POA: Diagnosis not present

## 2021-01-20 DIAGNOSIS — N183 Chronic kidney disease, stage 3 unspecified: Secondary | ICD-10-CM | POA: Diagnosis not present

## 2021-01-20 DIAGNOSIS — I129 Hypertensive chronic kidney disease with stage 1 through stage 4 chronic kidney disease, or unspecified chronic kidney disease: Secondary | ICD-10-CM | POA: Diagnosis not present

## 2021-01-20 DIAGNOSIS — E1122 Type 2 diabetes mellitus with diabetic chronic kidney disease: Secondary | ICD-10-CM | POA: Diagnosis not present

## 2021-04-01 DIAGNOSIS — R197 Diarrhea, unspecified: Secondary | ICD-10-CM | POA: Diagnosis not present

## 2021-04-01 DIAGNOSIS — R531 Weakness: Secondary | ICD-10-CM | POA: Diagnosis not present

## 2021-04-01 DIAGNOSIS — E6609 Other obesity due to excess calories: Secondary | ICD-10-CM | POA: Diagnosis not present

## 2021-04-01 DIAGNOSIS — R110 Nausea: Secondary | ICD-10-CM | POA: Diagnosis not present

## 2021-04-01 DIAGNOSIS — Z6837 Body mass index (BMI) 37.0-37.9, adult: Secondary | ICD-10-CM | POA: Diagnosis not present

## 2021-04-21 DIAGNOSIS — N183 Chronic kidney disease, stage 3 unspecified: Secondary | ICD-10-CM | POA: Diagnosis not present

## 2021-04-21 DIAGNOSIS — I129 Hypertensive chronic kidney disease with stage 1 through stage 4 chronic kidney disease, or unspecified chronic kidney disease: Secondary | ICD-10-CM | POA: Diagnosis not present

## 2021-04-21 DIAGNOSIS — E1122 Type 2 diabetes mellitus with diabetic chronic kidney disease: Secondary | ICD-10-CM | POA: Diagnosis not present

## 2021-05-11 DIAGNOSIS — I509 Heart failure, unspecified: Secondary | ICD-10-CM | POA: Diagnosis not present

## 2021-05-11 DIAGNOSIS — E1122 Type 2 diabetes mellitus with diabetic chronic kidney disease: Secondary | ICD-10-CM | POA: Diagnosis not present

## 2021-05-11 DIAGNOSIS — E1159 Type 2 diabetes mellitus with other circulatory complications: Secondary | ICD-10-CM | POA: Diagnosis not present

## 2021-05-11 DIAGNOSIS — F0390 Unspecified dementia without behavioral disturbance: Secondary | ICD-10-CM | POA: Diagnosis not present

## 2021-05-11 DIAGNOSIS — Z794 Long term (current) use of insulin: Secondary | ICD-10-CM | POA: Diagnosis not present

## 2021-05-11 DIAGNOSIS — Z6837 Body mass index (BMI) 37.0-37.9, adult: Secondary | ICD-10-CM | POA: Diagnosis not present

## 2021-05-11 DIAGNOSIS — E1165 Type 2 diabetes mellitus with hyperglycemia: Secondary | ICD-10-CM | POA: Diagnosis not present

## 2021-05-11 DIAGNOSIS — N184 Chronic kidney disease, stage 4 (severe): Secondary | ICD-10-CM | POA: Diagnosis not present

## 2021-05-11 DIAGNOSIS — I130 Hypertensive heart and chronic kidney disease with heart failure and stage 1 through stage 4 chronic kidney disease, or unspecified chronic kidney disease: Secondary | ICD-10-CM | POA: Diagnosis not present

## 2021-05-25 DIAGNOSIS — E211 Secondary hyperparathyroidism, not elsewhere classified: Secondary | ICD-10-CM | POA: Diagnosis not present

## 2021-05-25 DIAGNOSIS — E1129 Type 2 diabetes mellitus with other diabetic kidney complication: Secondary | ICD-10-CM | POA: Diagnosis not present

## 2021-05-25 DIAGNOSIS — I5032 Chronic diastolic (congestive) heart failure: Secondary | ICD-10-CM | POA: Diagnosis not present

## 2021-05-25 DIAGNOSIS — E1122 Type 2 diabetes mellitus with diabetic chronic kidney disease: Secondary | ICD-10-CM | POA: Diagnosis not present

## 2021-05-25 DIAGNOSIS — R809 Proteinuria, unspecified: Secondary | ICD-10-CM | POA: Diagnosis not present

## 2021-05-25 DIAGNOSIS — N189 Chronic kidney disease, unspecified: Secondary | ICD-10-CM | POA: Diagnosis not present

## 2021-05-25 DIAGNOSIS — I129 Hypertensive chronic kidney disease with stage 1 through stage 4 chronic kidney disease, or unspecified chronic kidney disease: Secondary | ICD-10-CM | POA: Diagnosis not present

## 2021-05-25 DIAGNOSIS — D631 Anemia in chronic kidney disease: Secondary | ICD-10-CM | POA: Diagnosis not present

## 2021-05-27 DIAGNOSIS — E1122 Type 2 diabetes mellitus with diabetic chronic kidney disease: Secondary | ICD-10-CM | POA: Diagnosis not present

## 2021-05-27 DIAGNOSIS — D631 Anemia in chronic kidney disease: Secondary | ICD-10-CM | POA: Diagnosis not present

## 2021-05-27 DIAGNOSIS — E211 Secondary hyperparathyroidism, not elsewhere classified: Secondary | ICD-10-CM | POA: Diagnosis not present

## 2021-05-27 DIAGNOSIS — N189 Chronic kidney disease, unspecified: Secondary | ICD-10-CM | POA: Diagnosis not present

## 2021-05-27 DIAGNOSIS — I129 Hypertensive chronic kidney disease with stage 1 through stage 4 chronic kidney disease, or unspecified chronic kidney disease: Secondary | ICD-10-CM | POA: Diagnosis not present

## 2021-05-27 DIAGNOSIS — I5032 Chronic diastolic (congestive) heart failure: Secondary | ICD-10-CM | POA: Diagnosis not present

## 2021-06-20 ENCOUNTER — Telehealth: Payer: PPO | Admitting: Emergency Medicine

## 2021-06-20 DIAGNOSIS — J208 Acute bronchitis due to other specified organisms: Secondary | ICD-10-CM | POA: Diagnosis not present

## 2021-06-20 DIAGNOSIS — R69 Illness, unspecified: Secondary | ICD-10-CM | POA: Diagnosis not present

## 2021-06-20 DIAGNOSIS — B9689 Other specified bacterial agents as the cause of diseases classified elsewhere: Secondary | ICD-10-CM | POA: Diagnosis not present

## 2021-06-20 DIAGNOSIS — R051 Acute cough: Secondary | ICD-10-CM

## 2021-06-20 MED ORDER — BENZONATATE 100 MG PO CAPS: 100.0000 mg | ORAL_CAPSULE | Freq: Two times a day (BID) | ORAL | 0 refills | Status: DC | PRN

## 2021-06-20 MED ORDER — AZITHROMYCIN 250 MG PO TABS: ORAL_TABLET | ORAL | 0 refills | Status: AC

## 2021-06-20 NOTE — Patient Instructions (Addendum)
Courtney [name], thank you for joining Lestine Box, PA-C for today's virtual visit.  While this provider is not your primary care provider (PCP), if your PCP is located in our provider database this encounter information will be shared with them immediately following your visit.  Consent: (Patient) Courtney [name] provided verbal consent for this virtual visit at the beginning of the encounter.  Current Medications:  Current Outpatient Medications:    Aspirin (VAZALORE) 81 MG CAPS, Take by mouth., Disp: , Rfl:    azithromycin (ZITHROMAX) 250 MG tablet, Take 2 tablets on day 1, then 1 tablet daily on days 2 through 5, Disp: 6 tablet, Rfl: 0   benzonatate (TESSALON) 100 MG capsule, Take 1 capsule (100 mg total) by mouth 2 (two) times daily as needed for cough., Disp: 20 capsule, Rfl: 0   acetaminophen (TYLENOL) 325 MG tablet, Take 2 tablets (650 mg total) by mouth every 6 (six) hours as needed for mild pain, fever or headache (or Fever >/= 101)., Disp: 12 tablet, Rfl: 0   amLODipine (NORVASC) 5 MG tablet, Take 1 tablet (5 mg total) by mouth daily., Disp: 30 tablet, Rfl: 0   aspirin EC 325 MG tablet, Take 1 tablet (325 mg total) by mouth daily., Disp: 30 tablet, Rfl: 0   atorvastatin (LIPITOR) 40 MG tablet, Take 1 tablet (40 mg total) by mouth every morning., Disp: 30 tablet, Rfl: 0   calcitRIOL (ROCALTROL) 0.25 MCG capsule, Take 1 capsule (0.25 mcg total) by mouth every other day. Once A Day on Mon, Wed, Fri, for stage 3b CKD, Disp: 15 capsule, Rfl: 0   cyanocobalamin 1000 MCG tablet, Take by mouth., Disp: , Rfl:    donepezil (ARICEPT) 10 MG tablet, Take 1 tablet (10 mg total) by mouth at bedtime., Disp: 30 tablet, Rfl: 0   ferrous sulfate 325 (65 FE) MG tablet, Take 1 tablet (325 mg total) by mouth daily with breakfast., Disp: 30 tablet, Rfl: 0   furosemide (LASIX) 40 MG tablet, Take 1 tablet (40 mg total) by mouth daily., Disp: 30 tablet, Rfl: 0   insulin glargine (LANTUS SOLOSTAR) 100 UNIT/ML  Solostar Pen, Inject 40 Units into the skin at bedtime., Disp: 15 mL, Rfl: 0   labetalol (NORMODYNE) 200 MG tablet, Take 2 tablets (400 mg total) by mouth 2 (two) times daily., Disp: 120 tablet, Rfl: 0   levothyroxine (SYNTHROID) 150 MCG tablet, Take 1 tablet (150 mcg total) by mouth every morning., Disp: 30 tablet, Rfl: 0   lisinopril (ZESTRIL) 5 MG tablet, Take 1 tablet (5 mg total) by mouth daily., Disp: 30 tablet, Rfl: 0   memantine (NAMENDA XR) 14 MG CP24 24 hr capsule, Take 1 capsule (14 mg total) by mouth daily for 7 days., Disp: 7 capsule, Rfl: 0   memantine (NAMENDA XR) 21 MG CP24 24 hr capsule, Take 1 capsule (21 mg total) by mouth daily for 7 days., Disp: 7 capsule, Rfl: 0   memantine (NAMENDA XR) 28 MG CP24 24 hr capsule, Take 1 capsule (28 mg total) by mouth daily., Disp: 30 capsule, Rfl: 0   memantine (NAMENDA XR) 7 MG CP24 24 hr capsule, Take 1 capsule (7 mg total) by mouth daily for 3 days., Disp: 3 capsule, Rfl: 0   MODERNA COVID-19 BIVAL BOOSTER 50 MCG/0.5ML injection, , Disp: , Rfl:    Omega 3-6-9 Fatty Acids (OMEGA DHA PO), Take 1 tablet by mouth daily. [phone] mg, Disp: , Rfl:    QUEtiapine (SEROQUEL) 25 MG tablet, Take  1 tablet (25 mg total) by mouth daily after supper., Disp: 30 tablet, Rfl: 0   sertraline (ZOLOFT) 100 MG tablet, Take 1 tablet (100 mg total) by mouth every morning., Disp: 30 tablet, Rfl: 0   UNABLE TO FIND, Diet - NAS, ConCHO, Disp: , Rfl:    Medications ordered in this encounter:  Meds ordered this encounter  Medications   azithromycin (ZITHROMAX) 250 MG tablet    Sig: Take 2 tablets on day 1, then 1 tablet daily on days 2 through 5    Dispense:  6 tablet    Refill:  0    Order Specific Question:   Supervising Provider    Answer:   Sabra Heck, BRIAN [[id number]]   benzonatate (TESSALON) 100 MG capsule    Sig: Take 1 capsule (100 mg total) by mouth 2 (two) times daily as needed for cough.    Dispense:  20 capsule    Refill:  0    Order Specific Question:    Supervising Provider    Answer:   Sabra Heck, [place]     *If you need refills on other medications prior to your next appointment, please contact your pharmacy*  Follow-Up: Call back or seek an in-person evaluation if the symptoms worsen or if the condition fails to improve as anticipated.  Other Instructions Based on symptoms and multiple comorbidities discussed in person evaluation with daughter who is patient's POA.  Daughter states she would like to avoid in person evaluation at this time.  Daughter states she will have EMS come out to do a wellness check to assess her vital signs and listen to her heart and lungs.   In the meantime I will cover for a possible bacterial bronchitis Get plenty of rest and push fluids Tessalon perles for cough Z-pack prescribed.  Take as directed and to completion Use OTC medications like ibuprofen or tylenol as needed fever or pain Follow up with PCP in 1-2 days via phone or e-visit for recheck and to ensure symptoms are improving Follow up in person to urgent care or go to the ED if you have any new or worsening symptoms such as fever, worsening cough, shortness of breath, chest tightness, chest pain, turning blue, changes in mental status, etc...     If you have been instructed to have an in-person evaluation today at a local Urgent Care facility, please use the link below. It will take you to a list of all of our available [facility], including address, phone number and hours of operation. Please do not delay care.  [facility]  If you or a family member do not have a primary care provider, use the link below to schedule a visit and establish care. When you choose a [facility] primary care physician or advanced practice provider, you gain a long-term partner in health. Find a Primary Care Provider  Learn more about [facility] in-office and virtual care options: [facility]

## 2021-06-20 NOTE — Progress Notes (Signed)
Virtual Visit Consent   Courtney [name], you are scheduled for a virtual visit with a [facility] provider today.     Just as with appointments in the office, your consent must be obtained to participate.  Your consent will be active for this visit and any virtual visit you may have with one of our providers in the next 365 days.     If you have a MyChart account, a copy of this consent can be sent to you electronically.  All virtual visits are billed to your insurance company just like a traditional visit in the office.    As this is a virtual visit, video technology does not allow for your provider to perform a traditional examination.  This may limit your provider's ability to fully assess your condition.  If your provider identifies any concerns that need to be evaluated in person or the need to arrange testing (such as labs, EKG, etc.), we will make arrangements to do so.     Although advances in technology are sophisticated, we cannot ensure that it will always work on either your end or our end.  If the connection with a video visit is poor, the visit may have to be switched to a telephone visit.  With either a video or telephone visit, we are not always able to ensure that we have a secure connection.     I need to obtain your verbal consent now.   Are you willing to proceed with your visit today? Yes   Courtney [name] has provided verbal consent on 06/20/2021 for a virtual visit (video or telephone).   Lestine Box, [place]   Date: 06/20/2021 12:13 PM   Virtual Visit via Video Note   I, Lestine Box, connected with  Courtney [name]  ([id number], [id number]) on 06/20/21 at 12:15 PM EST by a video-enabled telemedicine application and verified that I am speaking with the correct person using two identifiers.  Location: Patient: Virtual Visit Location Patient: Home Provider: Virtual Visit Location Provider: Home Office   I discussed the limitations of evaluation and management by  telemedicine and the availability of in person appointments. The patient expressed understanding and agreed to proceed.    History of Present Illness: HPI obtained from daughter Courtney [name], who is power of attorney, mother with dementia Courtney [name] is a 76 y.o. who identifies as a female who was assigned female at birth, and is being seen today for cough and congestion x 1 week.  Family with similar symptoms.  Denies known covid or flu contacts.  Has tried OTC medications without relief.  Symptoms are made worse with eating.  Reports previous symptoms in the past with cold.  Denies fever, chills, fatigue, sinus pain, rhinorrhea, sore throat, SOB, wheezing, chest pain, nausea, changes in bowel or bladder habits.    ROS: As per HPI.  All other pertinent ROS negative.       HPI: HPI  Problems:  Patient Active Problem List   Diagnosis Date Noted   Low folic acid 40/02/2724   Foot fracture, right, sequela 10/02/2020   Hypertension associated with stage 3b chronic kidney disease due to type 2 diabetes mellitus ([place]) 09/30/2020   Stage 3 chronic kidney disease due to type 2 diabetes mellitus ([place]) 09/30/2020   Hyperlipidemia associated with type 2 diabetes mellitus ([place]) 09/30/2020   Primary osteoarthritis of knees, bilateral 09/30/2020   Major depression, recurrent, chronic ([place]) 09/30/2020   Psychosis in elderly without behavioral disturbance ([place])  09/30/2020   Vitamin B12 deficiency 09/30/2020   Bilateral lower extremity edema 09/30/2020   Anemia associated with diabetes mellitus due to underlying condition ([place]) 09/30/2020   History of CVA (cerebrovascular accident) 09/30/2020   Alzheimer's dementia with behavioral disturbance ([place]) 09/30/2020   AKI (acute kidney injury) ([place]) 09/27/2020   CVA (cerebral vascular accident) ([place]) 08/21/2019   Controlled type 2 diabetes mellitus with neurologic complication, with long-term current use of insulin ([place]) 08/21/2019   Hypothyroidism  01/23/2019   HTN (hypertension) 01/23/2019    Allergies: No Known Allergies Medications:  Current Outpatient Medications:    Aspirin (VAZALORE) 81 MG CAPS, Take by mouth., Disp: , Rfl:    azithromycin (ZITHROMAX) 250 MG tablet, Take 2 tablets on day 1, then 1 tablet daily on days 2 through 5, Disp: 6 tablet, Rfl: 0   benzonatate (TESSALON) 100 MG capsule, Take 1 capsule (100 mg total) by mouth 2 (two) times daily as needed for cough., Disp: 20 capsule, Rfl: 0   acetaminophen (TYLENOL) 325 MG tablet, Take 2 tablets (650 mg total) by mouth every 6 (six) hours as needed for mild pain, fever or headache (or Fever >/= 101)., Disp: 12 tablet, Rfl: 0   amLODipine (NORVASC) 5 MG tablet, Take 1 tablet (5 mg total) by mouth daily., Disp: 30 tablet, Rfl: 0   aspirin EC 325 MG tablet, Take 1 tablet (325 mg total) by mouth daily., Disp: 30 tablet, Rfl: 0   atorvastatin (LIPITOR) 40 MG tablet, Take 1 tablet (40 mg total) by mouth every morning., Disp: 30 tablet, Rfl: 0   calcitRIOL (ROCALTROL) 0.25 MCG capsule, Take 1 capsule (0.25 mcg total) by mouth every other day. Once A Day on Mon, Wed, Fri, for stage 3b CKD, Disp: 15 capsule, Rfl: 0   cyanocobalamin 1000 MCG tablet, Take by mouth., Disp: , Rfl:    donepezil (ARICEPT) 10 MG tablet, Take 1 tablet (10 mg total) by mouth at bedtime., Disp: 30 tablet, Rfl: 0   ferrous sulfate 325 (65 FE) MG tablet, Take 1 tablet (325 mg total) by mouth daily with breakfast., Disp: 30 tablet, Rfl: 0   furosemide (LASIX) 40 MG tablet, Take 1 tablet (40 mg total) by mouth daily., Disp: 30 tablet, Rfl: 0   insulin glargine (LANTUS SOLOSTAR) 100 UNIT/ML Solostar Pen, Inject 40 Units into the skin at bedtime., Disp: 15 mL, Rfl: 0   labetalol (NORMODYNE) 200 MG tablet, Take 2 tablets (400 mg total) by mouth 2 (two) times daily., Disp: 120 tablet, Rfl: 0   levothyroxine (SYNTHROID) 150 MCG tablet, Take 1 tablet (150 mcg total) by mouth every morning., Disp: 30 tablet, Rfl: 0    lisinopril (ZESTRIL) 5 MG tablet, Take 1 tablet (5 mg total) by mouth daily., Disp: 30 tablet, Rfl: 0   memantine (NAMENDA XR) 14 MG CP24 24 hr capsule, Take 1 capsule (14 mg total) by mouth daily for 7 days., Disp: 7 capsule, Rfl: 0   memantine (NAMENDA XR) 21 MG CP24 24 hr capsule, Take 1 capsule (21 mg total) by mouth daily for 7 days., Disp: 7 capsule, Rfl: 0   memantine (NAMENDA XR) 28 MG CP24 24 hr capsule, Take 1 capsule (28 mg total) by mouth daily., Disp: 30 capsule, Rfl: 0   memantine (NAMENDA XR) 7 MG CP24 24 hr capsule, Take 1 capsule (7 mg total) by mouth daily for 3 days., Disp: 3 capsule, Rfl: 0   MODERNA COVID-19 BIVAL BOOSTER 50 MCG/0.5ML injection, , Disp: , Rfl:  Omega 3-6-9 Fatty Acids (OMEGA DHA PO), Take 1 tablet by mouth daily. [id number] mg, Disp: , Rfl:    QUEtiapine (SEROQUEL) 25 MG tablet, Take 1 tablet (25 mg total) by mouth daily after supper., Disp: 30 tablet, Rfl: 0   sertraline (ZOLOFT) 100 MG tablet, Take 1 tablet (100 mg total) by mouth every morning., Disp: 30 tablet, Rfl: 0   UNABLE TO FIND, Diet - NAS, ConCHO, Disp: , Rfl:   Observations/Objective: Patient is well-developed, well-nourished in no acute distress.  Resting comfortably at home. Appears fatigued, but nontoxic Head is normocephalic, atraumatic.  No labored breathing.  Speech is clear and coherent with logical content.  Patient is sitting, oriented to name  Assessment and Plan: 1. Acute cough  2. Acute bacterial bronchitis  Based on symptoms and multiple comorbidities discussed in person evaluation with daughter who is patient's POA.  Daughter states she would like to avoid in person evaluation at this time.  Daughter states she will have EMS come out to do a wellness check to assess her vital signs and listen to her heart and lungs.   In the meantime I will cover for a possible bacterial bronchitis Get plenty of rest and push fluids Tessalon perles for cough Z-pack prescribed.  Take as  directed and to completion Use OTC medications like ibuprofen or tylenol as needed fever or pain Follow up with PCP in 1-2 days via phone or e-visit for recheck and to ensure symptoms are improving Follow up in person to urgent care or go to the ED if you have any new or worsening symptoms such as fever, worsening cough, shortness of breath, chest tightness, chest pain, turning blue, changes in mental status, etc...    Follow Up Instructions: I discussed the assessment and treatment plan with the patient. The patient was provided an opportunity to ask questions and all were answered. The patient agreed with the plan and demonstrated an understanding of the instructions.  A copy of instructions were sent to the patient via MyChart unless otherwise noted below.    The patient was advised to call back or seek an in-person evaluation if the symptoms worsen or if the condition fails to improve as anticipated.  Time:  I spent 10-15 minutes with the patient via telehealth technology discussing the above problems/concerns.    Lestine Box, PA-C

## 2021-06-21 ENCOUNTER — Other Ambulatory Visit: Payer: Self-pay

## 2021-06-21 ENCOUNTER — Emergency Department (HOSPITAL_COMMUNITY)
Admission: EM | Admit: 2021-06-21 | Discharge: 2021-06-21 | Disposition: A | Discharge status: Home / Self Care | Payer: PPO | Attending: Emergency Medicine | Admitting: Emergency Medicine

## 2021-06-21 ENCOUNTER — Emergency Department (HOSPITAL_COMMUNITY): Payer: PPO

## 2021-06-21 ENCOUNTER — Encounter (HOSPITAL_COMMUNITY): Payer: Self-pay

## 2021-06-21 DIAGNOSIS — I959 Hypotension, unspecified: Secondary | ICD-10-CM | POA: Diagnosis not present

## 2021-06-21 DIAGNOSIS — Z20822 Contact with and (suspected) exposure to covid-19: Secondary | ICD-10-CM | POA: Insufficient documentation

## 2021-06-21 DIAGNOSIS — Z7982 Long term (current) use of aspirin: Secondary | ICD-10-CM | POA: Diagnosis not present

## 2021-06-21 DIAGNOSIS — Z7952 Long term (current) use of systemic steroids: Secondary | ICD-10-CM | POA: Insufficient documentation

## 2021-06-21 DIAGNOSIS — R7989 Other specified abnormal findings of blood chemistry: Secondary | ICD-10-CM | POA: Insufficient documentation

## 2021-06-21 DIAGNOSIS — Z794 Long term (current) use of insulin: Secondary | ICD-10-CM | POA: Diagnosis not present

## 2021-06-21 DIAGNOSIS — D649 Anemia, unspecified: Secondary | ICD-10-CM | POA: Diagnosis not present

## 2021-06-21 DIAGNOSIS — I10 Essential (primary) hypertension: Secondary | ICD-10-CM | POA: Insufficient documentation

## 2021-06-21 DIAGNOSIS — Z79899 Other long term (current) drug therapy: Secondary | ICD-10-CM | POA: Diagnosis not present

## 2021-06-21 DIAGNOSIS — J209 Acute bronchitis, unspecified: Secondary | ICD-10-CM

## 2021-06-21 DIAGNOSIS — F0390 Unspecified dementia without behavioral disturbance: Secondary | ICD-10-CM | POA: Insufficient documentation

## 2021-06-21 DIAGNOSIS — E119 Type 2 diabetes mellitus without complications: Secondary | ICD-10-CM | POA: Diagnosis not present

## 2021-06-21 DIAGNOSIS — Z743 Need for continuous supervision: Secondary | ICD-10-CM | POA: Diagnosis not present

## 2021-06-21 DIAGNOSIS — R059 Cough, unspecified: Secondary | ICD-10-CM | POA: Diagnosis not present

## 2021-06-21 DIAGNOSIS — R4182 Altered mental status, unspecified: Secondary | ICD-10-CM | POA: Diagnosis not present

## 2021-06-21 LAB — URINALYSIS, MICROSCOPIC (REFLEX)

## 2021-06-21 LAB — RESP PANEL BY RT-PCR (FLU A&B, COVID) ARPGX2
Influenza A by PCR: NEGATIVE
Influenza B by PCR: NEGATIVE
SARS Coronavirus 2 by RT PCR: NEGATIVE

## 2021-06-21 LAB — URINALYSIS, ROUTINE W REFLEX MICROSCOPIC
Glucose, UA: NEGATIVE mg/dL
Ketones, ur: NEGATIVE mg/dL
Leukocytes,Ua: NEGATIVE
Nitrite: NEGATIVE
Protein, ur: 30 mg/dL — AB
Specific Gravity, Urine: >1.03 — ABNORMAL HIGH (ref 1.005–1.030)
pH: 5.5 (ref 5.0–8.0)

## 2021-06-21 LAB — CBC WITH DIFFERENTIAL/PLATELET
Abs Immature Granulocytes: 0.01 10*3/uL (ref 0.00–0.07)
Basophils Absolute: 0 10*3/uL (ref 0.0–0.1)
Basophils Relative: 1 %
Eosinophils Absolute: 0 10*3/uL (ref 0.0–0.5)
Eosinophils Relative: 1 %
HCT: 31.6 % — ABNORMAL LOW (ref 36.0–46.0)
Hemoglobin: 10.7 g/dL — ABNORMAL LOW (ref 12.0–15.0)
Immature Granulocytes: 0 %
Lymphocytes Relative: 23 %
Lymphs Abs: 1 10*3/uL (ref 0.7–4.0)
MCH: 31.8 pg (ref 26.0–34.0)
MCHC: 33.9 g/dL (ref 30.0–36.0)
MCV: 94 fL (ref 80.0–100.0)
Monocytes Absolute: 0.5 10*3/uL (ref 0.1–1.0)
Monocytes Relative: 11 %
Neutro Abs: 2.8 10*3/uL (ref 1.7–7.7)
Neutrophils Relative %: 64 %
Platelets: 108 10*3/uL — ABNORMAL LOW (ref 150–400)
RBC: 3.36 MIL/uL — ABNORMAL LOW (ref 3.87–5.11)
RDW: 13.1 % (ref 11.5–15.5)
WBC: 4.2 10*3/uL (ref 4.0–10.5)
nRBC: 0 % (ref 0.0–0.2)

## 2021-06-21 LAB — BASIC METABOLIC PANEL
Anion gap: 7 (ref 5–15)
BUN: 46 mg/dL — ABNORMAL HIGH (ref 8–23)
CO2: 21 mmol/L — ABNORMAL LOW (ref 22–32)
Calcium: 8.6 mg/dL — ABNORMAL LOW (ref 8.9–10.3)
Chloride: 109 mmol/L (ref 98–111)
Creatinine, Ser: 1.95 mg/dL — ABNORMAL HIGH (ref 0.44–1.00)
GFR, Estimated: 26 mL/min — ABNORMAL LOW (ref >60)
Glucose, Bld: 162 mg/dL — ABNORMAL HIGH (ref 70–99)
Potassium: 4.1 mmol/L (ref 3.5–5.1)
Sodium: 137 mmol/L (ref 135–145)

## 2021-06-21 MED ORDER — PREDNISONE 10 MG PO TABS: 20.0000 mg | ORAL_TABLET | Freq: Every day | ORAL | 0 refills | Status: AC

## 2021-06-21 MED ORDER — ALBUTEROL SULFATE HFA 108 (90 BASE) MCG/ACT IN AERS
2.0000 | INHALATION_SPRAY | Freq: Once | RESPIRATORY_TRACT | Status: AC
  Administered 2021-06-21: 2 via RESPIRATORY_TRACT
  Filled 2021-06-21: qty 6.7

## 2021-06-21 MED ORDER — SODIUM CHLORIDE 0.9 % IV BOLUS
500.0000 mL | Freq: Once | INTRAVENOUS | Status: AC
  Administered 2021-06-21: 500 mL via INTRAVENOUS

## 2021-06-21 MED ORDER — IPRATROPIUM-ALBUTEROL 0.5-2.5 (3) MG/3ML IN SOLN
3.0000 mL | Freq: Once | RESPIRATORY_TRACT | Status: AC
  Administered 2021-06-21: 3 mL via RESPIRATORY_TRACT
  Filled 2021-06-21: qty 3

## 2021-06-21 NOTE — ED Notes (Signed)
Tolerated ambulation with walker and PO challenge.

## 2021-06-21 NOTE — ED Triage Notes (Signed)
Patient has had congestion since Thursday and started on zpack yesterday but still not feeling better and having loss o appetite.

## 2021-06-21 NOTE — Discharge Instructions (Signed)
Likely a viral infection, recommend over-the-counter pain medications like ibuprofen Tylenol for fever and pain control, nasal decongestions like Flonase and Zyrtec, Mucinex for cough.  If not eating recommend supplementing with Gatorade to help with electrolyte supplementation.  Given you inhaler please use as needed for shortness of breath may do 1 to 2 puffs every 4-6 hours, also given you steroids please take as prescribed  Follow-up PCP for further evaluation in 1 week's time to recheck your lungs as well as recheck your kidney function  Come back to the emergency department if you develop chest pain, shortness of breath, severe abdominal pain, uncontrolled nausea, vomiting, diarrhea.

## 2021-06-21 NOTE — ED Provider Notes (Signed)
[facility] EMERGENCY DEPARTMENT Provider Note   CSN: [id number] Arrival date & time: 06/21/21  2637     History  Chief Complaint  Patient presents with   Cough    Courtney [name] is a 76 y.o. female.  HPI  Patient with medical history including diabetes, dementia, hypertension presents with complaints of URI-like symptoms.  Husband was at bedside and was able to provide HPI he states that ever in the family has had this viral-like illness, he states that the patient developed this on Thursday she has been having nasal congestion sore throat and a productive cough, he also notes that she is having decreased appetite without nausea vomiting constipation diarrhea.  He also notes that she is slightly more weaker does not want to ambulate, and appears to be a little bit more confused from her baseline.  He states that he she just forgets things more frequently.  Patient is vaccinated against COVID, influenza, she was started on a Z-Pak yesterday had her first dose, no smoking history no CAD COPD history denies any alleviating or aggravating factors.  Patient endorses to me that she is having no complaints, she just feels slightly wheezy but denies any chest pain shortness of breath stomach pains nausea vomiting diarrhea general body aches.  Home Medications Prior to Admission medications   Medication Sig Start Date End Date Taking? Authorizing Provider  predniSONE (DELTASONE) 10 MG tablet Take 2 tablets (20 mg total) by mouth daily for 5 days. 06/21/21 06/26/21 Yes Marcello Fennel, PA-C  acetaminophen (TYLENOL) 325 MG tablet Take 2 tablets (650 mg total) by mouth every 6 (six) hours as needed for mild pain, fever or headache (or Fever >/= 101). 01/24/19   Emokpae, Courage, MD  amLODipine (NORVASC) 5 MG tablet Take 1 tablet (5 mg total) by mouth daily. 10/05/20   Gerlene Fee, NP  Aspirin (VAZALORE) 81 MG CAPS Take by mouth. 08/21/19   [provider]  aspirin EC 325 MG tablet Take 1  tablet (325 mg total) by mouth daily. 08/21/19   Kathie Dike, MD  atorvastatin (LIPITOR) 40 MG tablet Take 1 tablet (40 mg total) by mouth every morning. 10/05/20   Gerlene Fee, NP  azithromycin (ZITHROMAX) 250 MG tablet Take 2 tablets on day 1, then 1 tablet daily on days 2 through 5 06/20/21 06/25/21  Wurst, [place], PA-C  benzonatate (TESSALON) 100 MG capsule Take 1 capsule (100 mg total) by mouth 2 (two) times daily as needed for cough. 06/20/21   Wurst, [place], PA-C  calcitRIOL (ROCALTROL) 0.25 MCG capsule Take 1 capsule (0.25 mcg total) by mouth every other day. Once A Day on Mon, Wed, Fri, for stage 3b CKD 10/05/20   Gerlene Fee, NP  cyanocobalamin 1000 MCG tablet Take by mouth.    [provider]  donepezil (ARICEPT) 10 MG tablet Take 1 tablet (10 mg total) by mouth at bedtime. 10/05/20   Gerlene Fee, NP  ferrous sulfate 325 (65 FE) MG tablet Take 1 tablet (325 mg total) by mouth daily with breakfast. 10/05/20   Gerlene Fee, NP  furosemide (LASIX) 40 MG tablet Take 1 tablet (40 mg total) by mouth daily. 10/05/20   Gerlene Fee, NP  insulin glargine (LANTUS SOLOSTAR) 100 UNIT/ML Solostar Pen Inject 40 Units into the skin at bedtime. 10/05/20   Gerlene Fee, NP  labetalol (NORMODYNE) 200 MG tablet Take 2 tablets (400 mg total) by mouth 2 (two) times daily. 10/05/20  Gerlene Fee, NP  levothyroxine (SYNTHROID) 150 MCG tablet Take 1 tablet (150 mcg total) by mouth every morning. 10/05/20   Gerlene Fee, NP  lisinopril (ZESTRIL) 5 MG tablet Take 1 tablet (5 mg total) by mouth daily. 10/05/20   Gerlene Fee, NP  memantine (NAMENDA XR) 14 MG CP24 24 hr capsule Take 1 capsule (14 mg total) by mouth daily for 7 days. 10/09/20 10/16/20  Gerlene Fee, NP  memantine (NAMENDA XR) 21 MG CP24 24 hr capsule Take 1 capsule (21 mg total) by mouth daily for 7 days. 10/17/20 10/24/20  Gerlene Fee, NP  memantine (NAMENDA XR) 28 MG CP24 24 hr capsule Take 1 capsule (28  mg total) by mouth daily. 10/25/20   Gerlene Fee, NP  memantine (NAMENDA XR) 7 MG CP24 24 hr capsule Take 1 capsule (7 mg total) by mouth daily for 3 days. 10/05/20 10/08/20  Gerlene Fee, NP  MODERNA COVID-19 BIVAL BOOSTER 50 MCG/0.5ML injection  04/02/21   [provider]  Omega 3-6-9 Fatty Acids (OMEGA DHA PO) Take 1 tablet by mouth daily. [phone] mg    [provider]  QUEtiapine (SEROQUEL) 25 MG tablet Take 1 tablet (25 mg total) by mouth daily after supper. 10/05/20   Gerlene Fee, NP  sertraline (ZOLOFT) 100 MG tablet Take 1 tablet (100 mg total) by mouth every morning. 10/05/20   Gerlene Fee, NP  UNABLE TO FIND Diet - NAS, [place]    [provider]      Allergies    Patient has no known allergies.    Review of Systems   Review of Systems  Unable to perform ROS: Dementia   Physical Exam Updated Vital Signs BP (!) 121/53    Pulse (!) 50    Temp 99.2 F (37.3 C) (Oral)    Resp 20    Ht 5\' 1"  (1.549 m)    Wt 94.8 kg    SpO2 96%    BMI 39.49 kg/m  Physical Exam Vitals and nursing note reviewed.  Constitutional:      General: She is not in acute distress.    Appearance: She is not ill-appearing.  HENT:     Head: Normocephalic and atraumatic.     Nose: No congestion.     Mouth/Throat:     Mouth: Mucous membranes are moist.     Pharynx: Oropharynx is clear. No oropharyngeal exudate or posterior oropharyngeal erythema.  Eyes:     Conjunctiva/sclera: Conjunctivae normal.  Cardiovascular:     Rate and Rhythm: Normal rate and regular rhythm.     Pulses: Normal pulses.     Heart sounds: No murmur heard.   No friction rub. No gallop.  Pulmonary:     Effort: No respiratory distress.     Breath sounds: Wheezing present. No rhonchi or rales.     Comments: No signs of respiratory distress, nontachypneic, nonhypoxic, mid 90s on room air, able speak in full sentences, patient has slightly tight sounding chest with expiratory wheezing heard  bilaterally no rales rhonchi or stridor present.  No assessor muscles usage Abdominal:     Palpations: Abdomen is soft.     Tenderness: There is no abdominal tenderness. There is no right CVA tenderness or left CVA tenderness.  Musculoskeletal:     Right lower leg: No edema.     Left lower leg: No edema.  Skin:    General: Skin is warm and dry.  Neurological:  Mental Status: She is alert.     Comments: Alert and orient x4, will have brief episodes of forgetfulness, no facial asymmetry no difficult word finding able follow two-step commands no unilateral weakness present my exam.  Psychiatric:        Mood and Affect: Mood normal.    ED Results / Procedures / Treatments   Labs (all labs ordered are listed, but only abnormal results are displayed) Labs Reviewed  BASIC METABOLIC PANEL - Abnormal; Notable for the following components:      Result Value   CO2 21 (*)    Glucose, Bld 162 (*)    BUN 46 (*)    Creatinine, Ser 1.95 (*)    Calcium 8.6 (*)    GFR, Estimated 26 (*)    All other components within normal limits  CBC WITH DIFFERENTIAL/PLATELET - Abnormal; Notable for the following components:   RBC 3.36 (*)    Hemoglobin 10.7 (*)    HCT 31.6 (*)    Platelets 108 (*)    All other components within normal limits  URINALYSIS, ROUTINE W REFLEX MICROSCOPIC - Abnormal; Notable for the following components:   APPearance HAZY (*)    Specific Gravity, Urine >1.030 (*)    Hgb urine dipstick SMALL (*)    Bilirubin Urine SMALL (*)    Protein, ur 30 (*)    All other components within normal limits  URINALYSIS, MICROSCOPIC (REFLEX) - Abnormal; Notable for the following components:   Bacteria, UA MANY (*)    All other components within normal limits  RESP PANEL BY RT-PCR (FLU A&B, COVID) [id number]    EKG EKG Interpretation  Date/Time:  Monday June 21 2021 09:29:07 EST Ventricular Rate:  56 PR Interval:  134 QRS Duration: 91 QT Interval:  437 QTC Calculation: 422 R  Axis:   37 Text Interpretation: Sinus rhythm Borderline low voltage, extremity leads Confirmed by Godfrey Pick [phone]) on 06/21/2021 12:59:34 PM  Radiology DG Chest 2 View  Result Date: 06/21/2021 CLINICAL DATA:  Cough, congestion EXAM: CHEST - 2 VIEW COMPARISON:  09/27/2020 FINDINGS: The heart size and mediastinal contours are within normal limits. Both lungs are clear. Disc degenerative disease of the thoracic spine. IMPRESSION: No acute abnormality of the lungs. Electronically Signed   By: Delanna Ahmadi M.D.   On: 06/21/2021 10:01   CT Head Wo Contrast  Result Date: 06/21/2021 CLINICAL DATA:  Altered mental status EXAM: CT HEAD WITHOUT CONTRAST TECHNIQUE: Contiguous axial images were obtained from the base of the skull through the vertex without intravenous contrast. RADIATION DOSE REDUCTION: This exam was performed according to the departmental dose-optimization program which includes automated exposure control, adjustment of the mA and/or kV according to patient size and/or use of iterative reconstruction technique. COMPARISON:  09/27/2020 FINDINGS: Brain: No evidence of acute infarction, hemorrhage, hydrocephalus, extra-axial collection or mass lesion/mass effect. Vascular: No hyperdense vessel or unexpected calcification. Skull: Normal. Negative for fracture or focal lesion. Sinuses/Orbits: Small bilateral maxillary sinus air-fluid level (series 03/08. Other: None. IMPRESSION: 1. No acute intracranial pathology. 2. Small bilateral maxillary sinus air-fluid levels. Correlate for sinusitis. Electronically Signed   By: Delanna Ahmadi M.D.   On: 06/21/2021 11:26    Procedures Procedures    Medications Ordered in ED Medications  albuterol (VENTOLIN HFA) 108 (90 Base) MCG/ACT inhaler 2 puff (has no administration in time range)  ipratropium-albuterol (DUONEB) 0.5-2.5 (3) MG/3ML nebulizer solution 3 mL (3 mLs Nebulization Given 06/21/21 1041)  sodium chloride 0.9 % bolus 500 mL (  0 mLs Intravenous  Stopped 06/21/21 1355)    ED Course/ Medical Decision Making/ A&P                           Medical Decision Making Amount and/or Complexity of Data Reviewed Labs: ordered. Radiology: ordered.  Risk Prescription drug management.   This patient presents to the ED for concern of URI, this involves an extensive number of treatment options, and is a complaint that carries with it a high risk of complications and morbidity.  The differential diagnosis includes pneumonia, sepsis, bronchitis, CVA, metabolic derailments    Additional history obtained:  Additional history obtained from electronic medical record, husband who is at bedside    Co morbidities that complicate the patient evaluation  Dementia, diabetes, hypertension  Social Determinants of Health:  N/A    Lab Tests:  I Ordered, and personally interpreted labs.  The pertinent results include: CBC shows normocytic anemia hemoglobin 10.7 at baseline, BMP shows CO2 of 21 glucose 162 BUN slightly elevated at 46 baseline around 35 creatinine 1.95 baseline appears to be at 1.5, UA shows no nitrates or leukocytes many bacteria no squamous cells UA unremarkable   Imaging Studies ordered:  I ordered imaging studies including CT head, chest x-ray I independently visualized and interpreted imaging which showed both imaging were negative for acute findings I agree with the radiologist interpretation    Reevaluation:  Patient noted wheezing and tight sign chest to my initial examination likely viral bronchitis, will provide her with a DuoNeb and reassess  Patient reassessed still has slight expiratory wheezing but overall has improved, she states she is feeling better.  Will ambulate the patient p.o. challenge and reassess  Patient ambulating tolerating p.o. husband and patient both agreeable for discharge.   Rule out Low suspicion for CVA and/or intracranial head bleed no focal deficit present my exam, CT imaging is  negative for acute findings.  Low suspicion for significant metabolic derailments as lab work is reassuring, she does have a slight increase in her creatinine as well as her BUN likely secondary due to poor oral intake she was provided fluids as tolerating p.o.  Low suspicion for systemic infection as patient is nontoxic-appearing, vital signs reassuring, no obvious source infection noted on exam.  Low suspicion for pneumonia as lung sounds are clear bilaterally, x-ray did not reveal any acute findings.  Low suspicion for PE denies pleuritic chest pain, shortness of breath, vital signs are reassuring low suspicion for strep throat as oropharynx was visualized, no erythema or exudates noted.  Low suspicion patient would need  hospitalized due to viral infection or Covid as vital signs reassuring, patient is not in respiratory distress.      Dispostion and problem list  After consideration of the diagnostic results and the patients response to treatment, I feel that the patent would benefit from short course of steroids, inhaler discharged to follow-up PCP.  URI-likely this is viral in nature suspect viral bronchitis, she is on a Z-Pak currently we will have her continue this prior with steroids as well as bronchodilator, symptom management follow-up with PCP for further evaluation Increased creatinine-likely due to poor oral intake we will have her continue to rehydrate at home follow with PCP.            Final Clinical Impression(s) / ED Diagnoses Final diagnoses:  Acute bronchitis, unspecified organism    Rx / DC Orders ED Discharge Orders  Ordered    predniSONE (DELTASONE) 10 MG tablet  Daily        06/21/21 1442              Marcello Fennel, PA-C 06/21/21 1443    Godfrey Pick, MD 06/23/21 [phone]

## 2021-06-21 NOTE — ED Notes (Signed)
Patient ambulated with no difficulty. O2 remained above 90%

## 2021-06-28 DIAGNOSIS — B349 Viral infection, unspecified: Secondary | ICD-10-CM | POA: Diagnosis not present

## 2021-08-04 DIAGNOSIS — Z515 Encounter for palliative care: Secondary | ICD-10-CM | POA: Diagnosis not present

## 2021-08-04 DIAGNOSIS — F0390 Unspecified dementia without behavioral disturbance: Secondary | ICD-10-CM | POA: Diagnosis not present

## 2021-08-04 DIAGNOSIS — N184 Chronic kidney disease, stage 4 (severe): Secondary | ICD-10-CM | POA: Diagnosis not present

## 2021-09-01 DIAGNOSIS — I5032 Chronic diastolic (congestive) heart failure: Secondary | ICD-10-CM | POA: Diagnosis not present

## 2021-09-01 DIAGNOSIS — N189 Chronic kidney disease, unspecified: Secondary | ICD-10-CM | POA: Diagnosis not present

## 2021-09-01 DIAGNOSIS — E1122 Type 2 diabetes mellitus with diabetic chronic kidney disease: Secondary | ICD-10-CM | POA: Diagnosis not present

## 2021-09-01 DIAGNOSIS — E211 Secondary hyperparathyroidism, not elsewhere classified: Secondary | ICD-10-CM | POA: Diagnosis not present

## 2021-09-01 DIAGNOSIS — I129 Hypertensive chronic kidney disease with stage 1 through stage 4 chronic kidney disease, or unspecified chronic kidney disease: Secondary | ICD-10-CM | POA: Diagnosis not present

## 2021-09-01 DIAGNOSIS — D631 Anemia in chronic kidney disease: Secondary | ICD-10-CM | POA: Diagnosis not present

## 2021-09-07 DIAGNOSIS — L03032 Cellulitis of left toe: Secondary | ICD-10-CM | POA: Diagnosis not present

## 2021-09-07 DIAGNOSIS — F02811 Dementia in other diseases classified elsewhere, unspecified severity, with agitation: Secondary | ICD-10-CM | POA: Diagnosis not present

## 2021-09-07 DIAGNOSIS — Z515 Encounter for palliative care: Secondary | ICD-10-CM | POA: Diagnosis not present

## 2021-09-07 DIAGNOSIS — I509 Heart failure, unspecified: Secondary | ICD-10-CM | POA: Diagnosis not present

## 2021-09-08 DIAGNOSIS — I5032 Chronic diastolic (congestive) heart failure: Secondary | ICD-10-CM | POA: Diagnosis not present

## 2021-09-08 DIAGNOSIS — E1129 Type 2 diabetes mellitus with other diabetic kidney complication: Secondary | ICD-10-CM | POA: Diagnosis not present

## 2021-09-08 DIAGNOSIS — L03032 Cellulitis of left toe: Secondary | ICD-10-CM | POA: Diagnosis not present

## 2021-09-08 DIAGNOSIS — I129 Hypertensive chronic kidney disease with stage 1 through stage 4 chronic kidney disease, or unspecified chronic kidney disease: Secondary | ICD-10-CM | POA: Diagnosis not present

## 2021-09-08 DIAGNOSIS — E1122 Type 2 diabetes mellitus with diabetic chronic kidney disease: Secondary | ICD-10-CM | POA: Diagnosis not present

## 2021-09-08 DIAGNOSIS — N189 Chronic kidney disease, unspecified: Secondary | ICD-10-CM | POA: Diagnosis not present

## 2021-09-08 DIAGNOSIS — D631 Anemia in chronic kidney disease: Secondary | ICD-10-CM | POA: Diagnosis not present

## 2021-09-08 DIAGNOSIS — R809 Proteinuria, unspecified: Secondary | ICD-10-CM | POA: Diagnosis not present

## 2021-09-08 DIAGNOSIS — E211 Secondary hyperparathyroidism, not elsewhere classified: Secondary | ICD-10-CM | POA: Diagnosis not present

## 2021-09-08 DIAGNOSIS — E559 Vitamin D deficiency, unspecified: Secondary | ICD-10-CM | POA: Diagnosis not present

## 2021-09-09 DIAGNOSIS — R413 Other amnesia: Secondary | ICD-10-CM | POA: Diagnosis not present

## 2021-09-09 DIAGNOSIS — Z6834 Body mass index (BMI) 34.0-34.9, adult: Secondary | ICD-10-CM | POA: Diagnosis not present

## 2021-09-09 DIAGNOSIS — N184 Chronic kidney disease, stage 4 (severe): Secondary | ICD-10-CM | POA: Diagnosis not present

## 2021-09-09 DIAGNOSIS — M79675 Pain in left toe(s): Secondary | ICD-10-CM | POA: Diagnosis not present

## 2021-09-09 DIAGNOSIS — E6609 Other obesity due to excess calories: Secondary | ICD-10-CM | POA: Diagnosis not present

## 2021-09-13 DIAGNOSIS — N184 Chronic kidney disease, stage 4 (severe): Secondary | ICD-10-CM | POA: Diagnosis not present

## 2021-09-13 DIAGNOSIS — I130 Hypertensive heart and chronic kidney disease with heart failure and stage 1 through stage 4 chronic kidney disease, or unspecified chronic kidney disease: Secondary | ICD-10-CM | POA: Diagnosis not present

## 2021-09-13 DIAGNOSIS — E1159 Type 2 diabetes mellitus with other circulatory complications: Secondary | ICD-10-CM | POA: Diagnosis not present

## 2021-09-13 DIAGNOSIS — E1122 Type 2 diabetes mellitus with diabetic chronic kidney disease: Secondary | ICD-10-CM | POA: Diagnosis not present

## 2021-09-13 DIAGNOSIS — E039 Hypothyroidism, unspecified: Secondary | ICD-10-CM | POA: Diagnosis not present

## 2021-09-13 DIAGNOSIS — Z515 Encounter for palliative care: Secondary | ICD-10-CM | POA: Diagnosis not present

## 2021-09-13 DIAGNOSIS — F339 Major depressive disorder, recurrent, unspecified: Secondary | ICD-10-CM | POA: Diagnosis not present

## 2021-09-13 DIAGNOSIS — I509 Heart failure, unspecified: Secondary | ICD-10-CM | POA: Diagnosis not present

## 2021-09-13 DIAGNOSIS — Z794 Long term (current) use of insulin: Secondary | ICD-10-CM | POA: Diagnosis not present

## 2021-09-13 DIAGNOSIS — Z6834 Body mass index (BMI) 34.0-34.9, adult: Secondary | ICD-10-CM | POA: Diagnosis not present

## 2021-09-13 DIAGNOSIS — F0390 Unspecified dementia without behavioral disturbance: Secondary | ICD-10-CM | POA: Diagnosis not present

## 2021-09-13 DIAGNOSIS — E1165 Type 2 diabetes mellitus with hyperglycemia: Secondary | ICD-10-CM | POA: Diagnosis not present

## 2021-09-14 DIAGNOSIS — M79672 Pain in left foot: Secondary | ICD-10-CM | POA: Diagnosis not present

## 2021-09-14 DIAGNOSIS — M67472 Ganglion, left ankle and foot: Secondary | ICD-10-CM | POA: Diagnosis not present

## 2021-09-14 DIAGNOSIS — M79675 Pain in left toe(s): Secondary | ICD-10-CM | POA: Diagnosis not present

## 2021-09-14 DIAGNOSIS — M1990 Unspecified osteoarthritis, unspecified site: Secondary | ICD-10-CM | POA: Diagnosis not present

## 2021-10-05 DIAGNOSIS — M7752 Other enthesopathy of left foot: Secondary | ICD-10-CM | POA: Diagnosis not present

## 2021-10-05 DIAGNOSIS — M79672 Pain in left foot: Secondary | ICD-10-CM | POA: Diagnosis not present

## 2021-10-05 DIAGNOSIS — M10072 Idiopathic gout, left ankle and foot: Secondary | ICD-10-CM | POA: Diagnosis not present

## 2021-10-25 DIAGNOSIS — I509 Heart failure, unspecified: Secondary | ICD-10-CM | POA: Diagnosis not present

## 2021-10-25 DIAGNOSIS — F02811 Dementia in other diseases classified elsewhere, unspecified severity, with agitation: Secondary | ICD-10-CM | POA: Diagnosis not present

## 2021-12-03 IMAGING — MR MR HEAD W/O CM
18 of 19 series · 42 of 48 positions shown · non-contrast
Comparison: 08/20/2019

CLINICAL DATA: Altered mental status

EXAM:
MRI HEAD WITHOUT CONTRAST
TECHNIQUE: Multiplanar, multiecho pulse sequences of the brain and surrounding
structures were obtained without intravenous contrast.

[Series 5: DWI · axial · 4.0mm · 0.88mm/px · z∈[-65,+75]mm · 4 of 36 slices shown (1 of 6)]
[im 1/36]
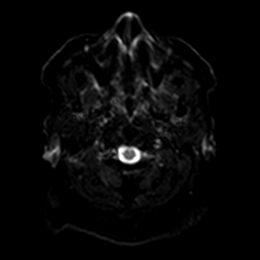
[im 12/36]
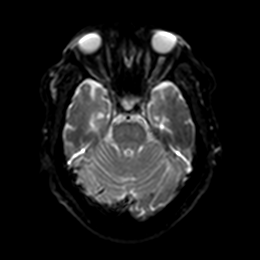
[im 24/36]
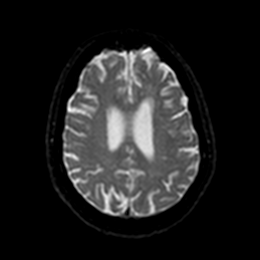
[im 36/36]
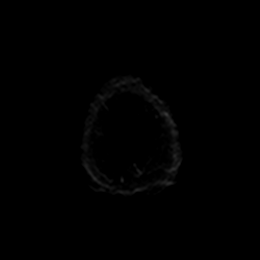

[Series 5: DWI · axial · 4.0mm · 0.88mm/px · z∈[-65,+75]mm · 4 of 36 slices shown (2 of 6)]
[im 1/36]
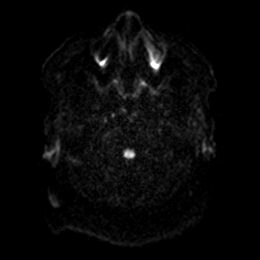
[im 12/36]
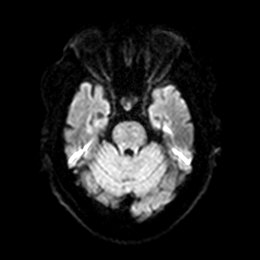
[im 24/36]
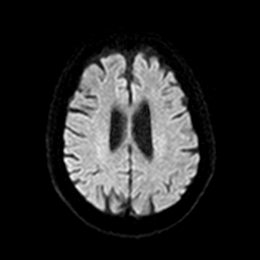
[im 36/36]
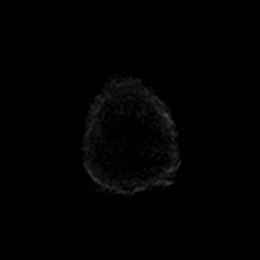

[Series 6: DWI · axial · 4.0mm · 0.88mm/px · z∈[-65,+75]mm · 3 of 36 slices shown (3 of 6)]
[im 1/36]
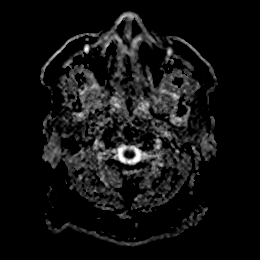
[im 18/36]
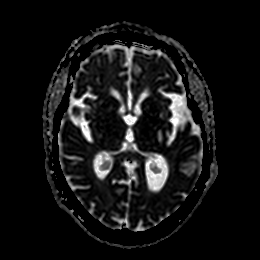
[im 36/36]
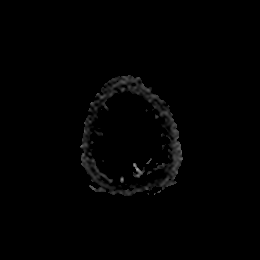

[Series 7: DWI · coronal · 5.0mm · 0.88mm/px · 3 of 28 slices shown (4 of 6)]
[im 1/28]
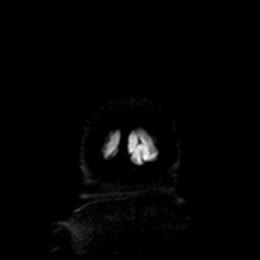
[im 14/28]
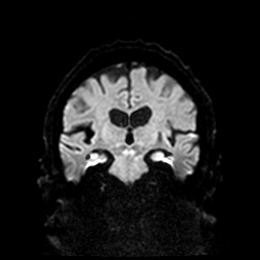
[im 28/28]
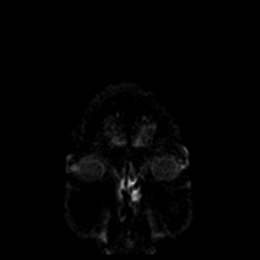

[Series 7: DWI · coronal · 5.0mm · 0.88mm/px · 3 of 28 slices shown (5 of 6)]
[im 1/28]
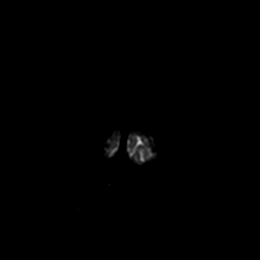
[im 14/28]
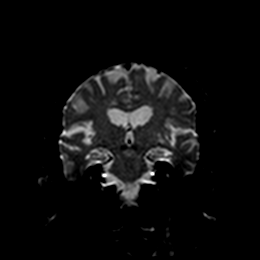
[im 28/28]
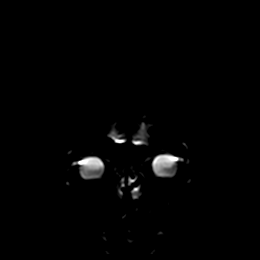

[Series 8: DWI · coronal · 5.0mm · 0.88mm/px · 3 of 28 slices shown (6 of 6)]
[im 1/28]
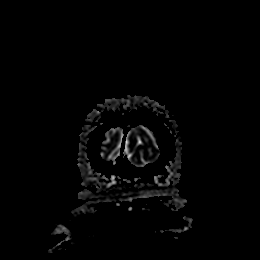
[im 14/28]
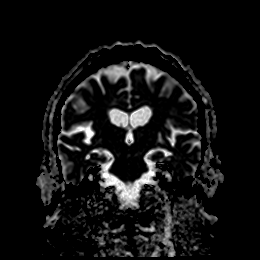
[im 28/28]
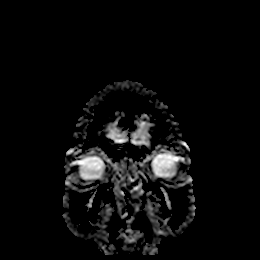

[Series 9: T1 · sagittal · 5.0mm · 0.94mm/px · 2 of 21 slices shown]
[im 1/21]
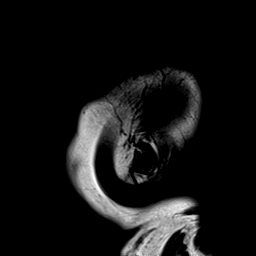
[im 21/21]
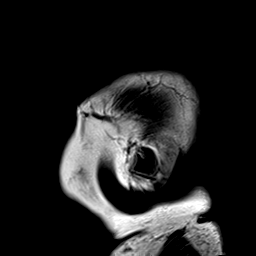

[Series 10: T2 · axial · 5.0mm · 0.72mm/px · z∈[-62,+71]mm · 2 of 20 slices shown (1 of 2)]
[im 1/20]
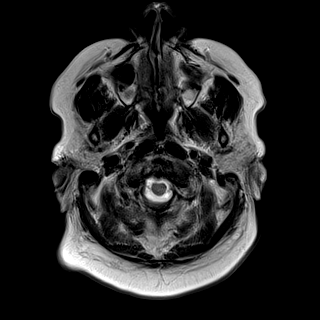
[im 20/20]
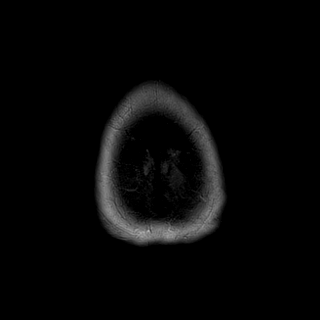

[Series 11: ax hemo · axial · 5.0mm · 0.86mm/px · z∈[-67,+76]mm · 2 of 25 slices shown]
[im 1/25]
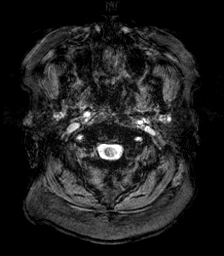
[im 25/25]
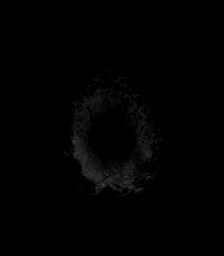

[Series 12: FLAIR · axial · 4.0mm · 0.43mm/px · z∈[-57,+66]mm · 3 of 32 slices shown]
[im 1/32]
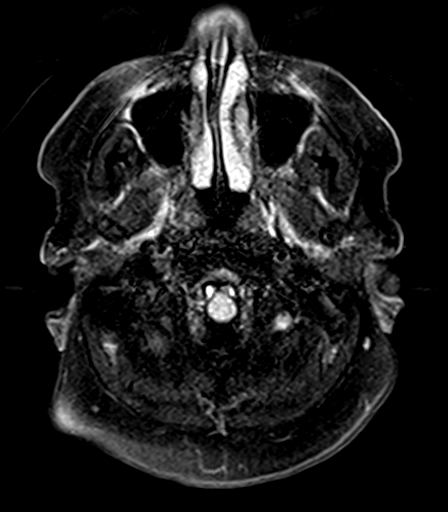
[im 16/32]
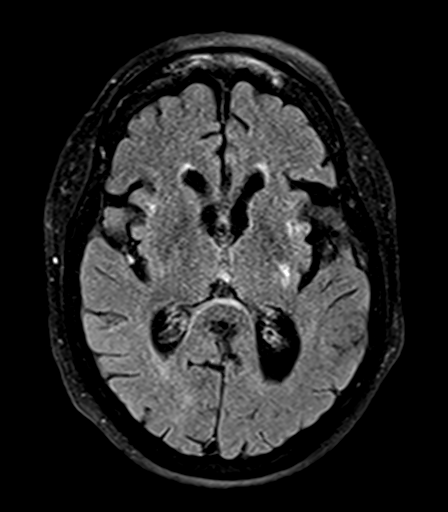
[im 32/32]
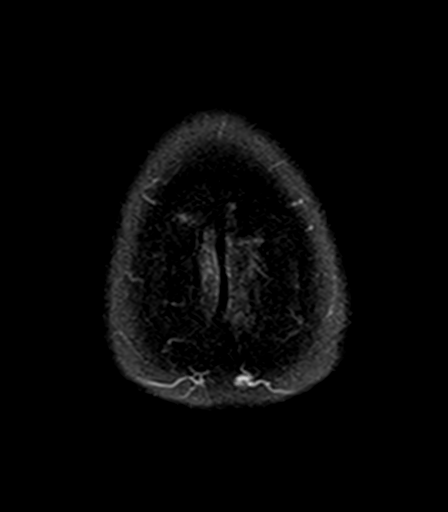

[Series 14: T2 · coronal · 5.0mm · 0.72mm/px · 2 of 26 slices shown (2 of 2)]
[im 1/26]
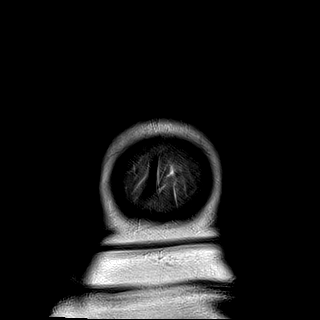
[im 26/26]
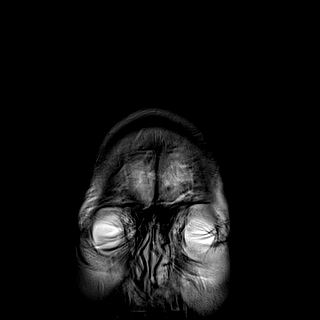

[Series 19: t2_tse_sag_fast · sagittal · 3.0mm · 0.43mm/px · 1 of 15 slices shown (1 of 2)]
[im 1/15]
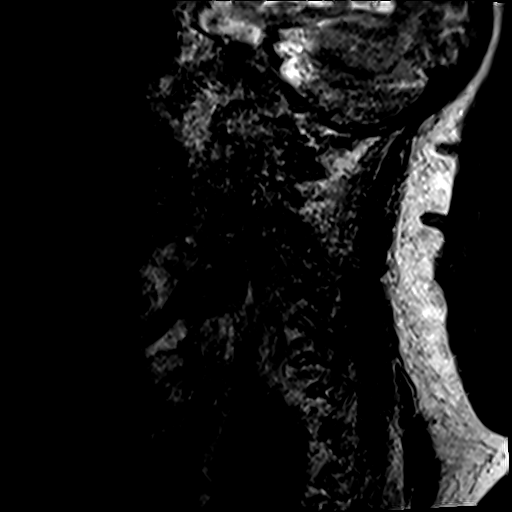

[Series 20: t1_tse_sag_fast · sagittal · 3.0mm · 0.43mm/px · 1 of 15 slices shown (1 of 2)]
[im 1/15]
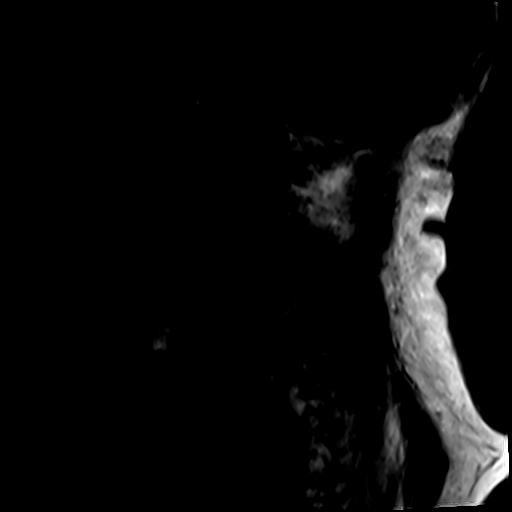

[Series 21: STIR · sagittal · 3.0mm · 0.86mm/px · 1 of 15 slices shown]
[im 1/15]
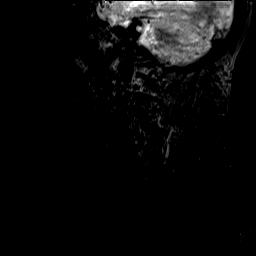

[Series 22: t2_tse_sag_fast · sagittal · 3.0mm · 0.43mm/px · 1 of 15 slices shown (2 of 2)]
[im 1/15]
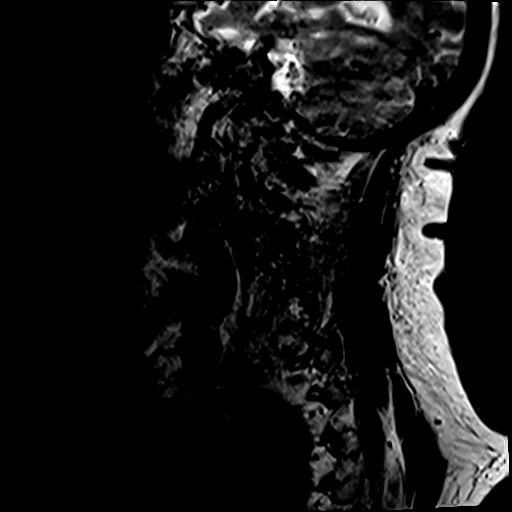

[Series 23: t1_tse_sag_fast · sagittal · 3.0mm · 0.43mm/px · 1 of 15 slices shown (2 of 2)]
[im 1/15]
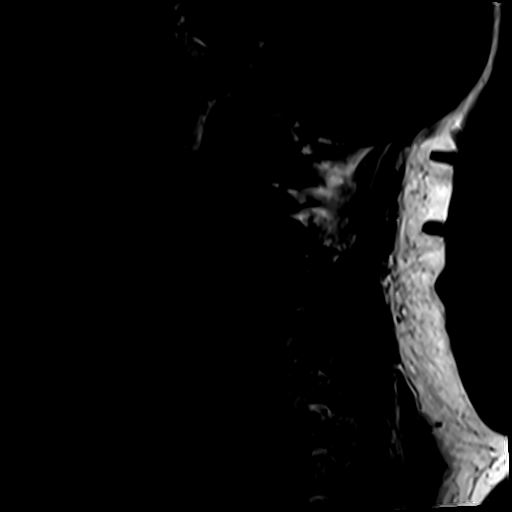

[Series 24: GRE · axial · 3.0mm · 0.78mm/px · z∈[-222,-126]mm · 3 of 32 slices shown]
[im 1/32]
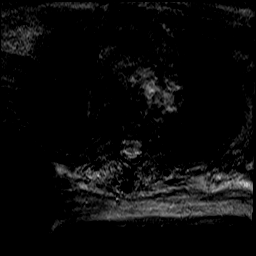
[im 16/32]
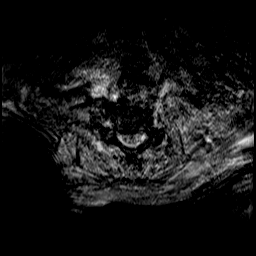
[im 32/32]
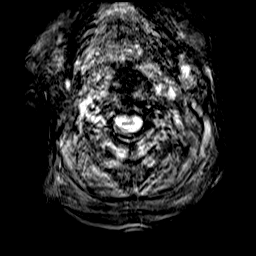

[Series 25: t2_tse_tra_fast · axial · 3.0mm · 0.78mm/px · z∈[-222,-126]mm · 3 of 32 slices shown]
[im 1/32]
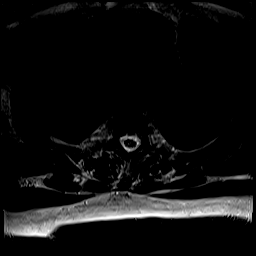
[im 16/32]
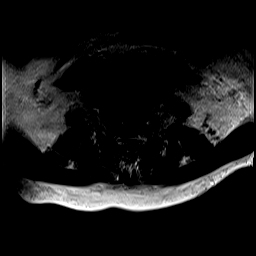
[im 32/32]
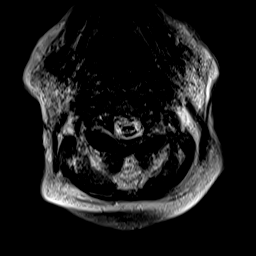

[42 of 48 positions shown; findings below may reference images not displayed]

FINDINGS: Motion artifact is present.

Brain: There is no acute infarction or intracranial hemorrhage.
There is no intracranial mass, mass effect, or edema. There is no
hydrocephalus or extra-axial fluid collection. Ventricles and sulci
are prominent reflecting generalized parenchymal volume loss.
Chronic small vessel infarcts of the left basal ganglia. Additional
patchy foci of T2 hyperintensity in the supratentorial white matter
nonspecific but may reflect mild chronic microvascular ischemic
changes.

Vascular: Major vessel flow voids at the skull base are preserved.

Skull and upper cervical spine: Normal marrow signal is preserved.

Sinuses/Orbits: Paranasal sinuses are aerated. Orbits are
unremarkable.

Other: Sella is unremarkable.  Mastoid air cells are clear.
IMPRESSION: No evidence of recent infarction, hemorrhage, or mass. Chronic left
basal ganglia infarcts. Mild chronic microvascular ischemic changes.

## 2021-12-14 DIAGNOSIS — N184 Chronic kidney disease, stage 4 (severe): Secondary | ICD-10-CM | POA: Diagnosis not present

## 2021-12-14 DIAGNOSIS — Z515 Encounter for palliative care: Secondary | ICD-10-CM | POA: Diagnosis not present

## 2021-12-22 DIAGNOSIS — I5032 Chronic diastolic (congestive) heart failure: Secondary | ICD-10-CM | POA: Diagnosis not present

## 2021-12-22 DIAGNOSIS — R809 Proteinuria, unspecified: Secondary | ICD-10-CM | POA: Diagnosis not present

## 2021-12-22 DIAGNOSIS — E211 Secondary hyperparathyroidism, not elsewhere classified: Secondary | ICD-10-CM | POA: Diagnosis not present

## 2021-12-22 DIAGNOSIS — E559 Vitamin D deficiency, unspecified: Secondary | ICD-10-CM | POA: Diagnosis not present

## 2021-12-22 DIAGNOSIS — D631 Anemia in chronic kidney disease: Secondary | ICD-10-CM | POA: Diagnosis not present

## 2021-12-22 DIAGNOSIS — E1129 Type 2 diabetes mellitus with other diabetic kidney complication: Secondary | ICD-10-CM | POA: Diagnosis not present

## 2021-12-22 DIAGNOSIS — I129 Hypertensive chronic kidney disease with stage 1 through stage 4 chronic kidney disease, or unspecified chronic kidney disease: Secondary | ICD-10-CM | POA: Diagnosis not present

## 2021-12-22 DIAGNOSIS — E1122 Type 2 diabetes mellitus with diabetic chronic kidney disease: Secondary | ICD-10-CM | POA: Diagnosis not present

## 2021-12-22 DIAGNOSIS — N189 Chronic kidney disease, unspecified: Secondary | ICD-10-CM | POA: Diagnosis not present

## 2021-12-29 ENCOUNTER — Other Ambulatory Visit: Payer: Self-pay | Admitting: Nephrology

## 2021-12-29 ENCOUNTER — Other Ambulatory Visit (HOSPITAL_COMMUNITY): Payer: Self-pay | Admitting: Nephrology

## 2021-12-29 DIAGNOSIS — E1129 Type 2 diabetes mellitus with other diabetic kidney complication: Secondary | ICD-10-CM | POA: Diagnosis not present

## 2021-12-29 DIAGNOSIS — I5032 Chronic diastolic (congestive) heart failure: Secondary | ICD-10-CM | POA: Diagnosis not present

## 2021-12-29 DIAGNOSIS — E211 Secondary hyperparathyroidism, not elsewhere classified: Secondary | ICD-10-CM | POA: Diagnosis not present

## 2021-12-29 DIAGNOSIS — D631 Anemia in chronic kidney disease: Secondary | ICD-10-CM | POA: Diagnosis not present

## 2021-12-29 DIAGNOSIS — R809 Proteinuria, unspecified: Secondary | ICD-10-CM | POA: Diagnosis not present

## 2021-12-29 DIAGNOSIS — E1122 Type 2 diabetes mellitus with diabetic chronic kidney disease: Secondary | ICD-10-CM | POA: Diagnosis not present

## 2021-12-29 DIAGNOSIS — N2581 Secondary hyperparathyroidism of renal origin: Secondary | ICD-10-CM

## 2021-12-29 DIAGNOSIS — N170 Acute kidney failure with tubular necrosis: Secondary | ICD-10-CM

## 2021-12-29 DIAGNOSIS — I129 Hypertensive chronic kidney disease with stage 1 through stage 4 chronic kidney disease, or unspecified chronic kidney disease: Secondary | ICD-10-CM | POA: Diagnosis not present

## 2021-12-29 DIAGNOSIS — N189 Chronic kidney disease, unspecified: Secondary | ICD-10-CM | POA: Diagnosis not present

## 2021-12-29 DIAGNOSIS — E559 Vitamin D deficiency, unspecified: Secondary | ICD-10-CM | POA: Diagnosis not present

## 2021-12-29 DIAGNOSIS — N183 Chronic kidney disease, stage 3 unspecified: Secondary | ICD-10-CM

## 2022-01-06 ENCOUNTER — Ambulatory Visit (HOSPITAL_COMMUNITY)
Admission: RE | Admit: 2022-01-06 | Discharge: 2022-01-06 | Disposition: A | Discharge status: Home / Self Care | Payer: PPO | Source: Ambulatory Visit | Attending: Nephrology | Admitting: Nephrology

## 2022-01-06 DIAGNOSIS — D631 Anemia in chronic kidney disease: Secondary | ICD-10-CM | POA: Insufficient documentation

## 2022-01-06 DIAGNOSIS — N2581 Secondary hyperparathyroidism of renal origin: Secondary | ICD-10-CM | POA: Diagnosis not present

## 2022-01-06 DIAGNOSIS — N183 Chronic kidney disease, stage 3 unspecified: Secondary | ICD-10-CM | POA: Insufficient documentation

## 2022-01-06 DIAGNOSIS — I5032 Chronic diastolic (congestive) heart failure: Secondary | ICD-10-CM | POA: Diagnosis not present

## 2022-01-06 DIAGNOSIS — E1122 Type 2 diabetes mellitus with diabetic chronic kidney disease: Secondary | ICD-10-CM | POA: Insufficient documentation

## 2022-01-06 DIAGNOSIS — N170 Acute kidney failure with tubular necrosis: Secondary | ICD-10-CM | POA: Insufficient documentation

## 2022-01-06 DIAGNOSIS — N281 Cyst of kidney, acquired: Secondary | ICD-10-CM | POA: Diagnosis not present

## 2022-01-06 DIAGNOSIS — N189 Chronic kidney disease, unspecified: Secondary | ICD-10-CM | POA: Diagnosis not present

## 2022-01-29 DIAGNOSIS — E211 Secondary hyperparathyroidism, not elsewhere classified: Secondary | ICD-10-CM | POA: Diagnosis not present

## 2022-01-29 DIAGNOSIS — D631 Anemia in chronic kidney disease: Secondary | ICD-10-CM | POA: Diagnosis not present

## 2022-01-29 DIAGNOSIS — Z8679 Personal history of other diseases of the circulatory system: Secondary | ICD-10-CM | POA: Diagnosis not present

## 2022-01-29 DIAGNOSIS — N170 Acute kidney failure with tubular necrosis: Secondary | ICD-10-CM | POA: Diagnosis not present

## 2022-01-29 DIAGNOSIS — Z6839 Body mass index (BMI) 39.0-39.9, adult: Secondary | ICD-10-CM | POA: Diagnosis not present

## 2022-01-29 DIAGNOSIS — I9589 Other hypotension: Secondary | ICD-10-CM | POA: Diagnosis not present

## 2022-01-29 DIAGNOSIS — Z5181 Encounter for therapeutic drug level monitoring: Secondary | ICD-10-CM | POA: Diagnosis not present

## 2022-01-29 DIAGNOSIS — E1122 Type 2 diabetes mellitus with diabetic chronic kidney disease: Secondary | ICD-10-CM | POA: Diagnosis not present

## 2022-01-29 DIAGNOSIS — I5032 Chronic diastolic (congestive) heart failure: Secondary | ICD-10-CM | POA: Diagnosis not present

## 2022-01-29 DIAGNOSIS — R809 Proteinuria, unspecified: Secondary | ICD-10-CM | POA: Diagnosis not present

## 2022-01-29 DIAGNOSIS — N189 Chronic kidney disease, unspecified: Secondary | ICD-10-CM | POA: Diagnosis not present

## 2022-01-29 DIAGNOSIS — E559 Vitamin D deficiency, unspecified: Secondary | ICD-10-CM | POA: Diagnosis not present

## 2022-02-01 DIAGNOSIS — E119 Type 2 diabetes mellitus without complications: Secondary | ICD-10-CM | POA: Diagnosis not present

## 2022-02-01 DIAGNOSIS — E782 Mixed hyperlipidemia: Secondary | ICD-10-CM | POA: Diagnosis not present

## 2022-02-01 DIAGNOSIS — E039 Hypothyroidism, unspecified: Secondary | ICD-10-CM | POA: Diagnosis not present

## 2022-02-01 DIAGNOSIS — I10 Essential (primary) hypertension: Secondary | ICD-10-CM | POA: Diagnosis not present

## 2022-02-01 DIAGNOSIS — N189 Chronic kidney disease, unspecified: Secondary | ICD-10-CM | POA: Diagnosis not present

## 2022-02-01 DIAGNOSIS — F02B0 Dementia in other diseases classified elsewhere, moderate, without behavioral disturbance, psychotic disturbance, mood disturbance, and anxiety: Secondary | ICD-10-CM | POA: Diagnosis not present

## 2022-02-08 DIAGNOSIS — Z515 Encounter for palliative care: Secondary | ICD-10-CM | POA: Diagnosis not present

## 2022-02-08 DIAGNOSIS — F0390 Unspecified dementia without behavioral disturbance: Secondary | ICD-10-CM | POA: Diagnosis not present

## 2022-02-14 DIAGNOSIS — Z111 Encounter for screening for respiratory tuberculosis: Secondary | ICD-10-CM | POA: Diagnosis not present

## 2022-03-08 DIAGNOSIS — B379 Candidiasis, unspecified: Secondary | ICD-10-CM | POA: Diagnosis not present

## 2022-03-08 DIAGNOSIS — E785 Hyperlipidemia, unspecified: Secondary | ICD-10-CM | POA: Diagnosis not present

## 2022-03-08 DIAGNOSIS — G4700 Insomnia, unspecified: Secondary | ICD-10-CM | POA: Diagnosis not present

## 2022-03-08 DIAGNOSIS — E039 Hypothyroidism, unspecified: Secondary | ICD-10-CM | POA: Diagnosis not present

## 2022-03-08 DIAGNOSIS — I10 Essential (primary) hypertension: Secondary | ICD-10-CM | POA: Diagnosis not present

## 2022-03-08 DIAGNOSIS — E119 Type 2 diabetes mellitus without complications: Secondary | ICD-10-CM | POA: Diagnosis not present

## 2022-03-19 DIAGNOSIS — E119 Type 2 diabetes mellitus without complications: Secondary | ICD-10-CM | POA: Diagnosis not present

## 2022-03-19 DIAGNOSIS — I10 Essential (primary) hypertension: Secondary | ICD-10-CM | POA: Diagnosis not present

## 2022-03-28 DIAGNOSIS — M79671 Pain in right foot: Secondary | ICD-10-CM | POA: Diagnosis not present

## 2022-03-28 DIAGNOSIS — M79672 Pain in left foot: Secondary | ICD-10-CM | POA: Diagnosis not present

## 2022-03-28 DIAGNOSIS — B351 Tinea unguium: Secondary | ICD-10-CM | POA: Diagnosis not present

## 2022-03-28 DIAGNOSIS — L600 Ingrowing nail: Secondary | ICD-10-CM | POA: Diagnosis not present

## 2022-03-29 DIAGNOSIS — D631 Anemia in chronic kidney disease: Secondary | ICD-10-CM | POA: Diagnosis not present

## 2022-03-29 DIAGNOSIS — N189 Chronic kidney disease, unspecified: Secondary | ICD-10-CM | POA: Diagnosis not present

## 2022-03-29 DIAGNOSIS — R809 Proteinuria, unspecified: Secondary | ICD-10-CM | POA: Diagnosis not present

## 2022-03-29 DIAGNOSIS — E559 Vitamin D deficiency, unspecified: Secondary | ICD-10-CM | POA: Diagnosis not present

## 2022-03-29 DIAGNOSIS — N170 Acute kidney failure with tubular necrosis: Secondary | ICD-10-CM | POA: Diagnosis not present

## 2022-03-29 DIAGNOSIS — E1129 Type 2 diabetes mellitus with other diabetic kidney complication: Secondary | ICD-10-CM | POA: Diagnosis not present

## 2022-03-29 DIAGNOSIS — Z5181 Encounter for therapeutic drug level monitoring: Secondary | ICD-10-CM | POA: Diagnosis not present

## 2022-03-29 DIAGNOSIS — E1122 Type 2 diabetes mellitus with diabetic chronic kidney disease: Secondary | ICD-10-CM | POA: Diagnosis not present

## 2022-03-29 DIAGNOSIS — I5032 Chronic diastolic (congestive) heart failure: Secondary | ICD-10-CM | POA: Diagnosis not present

## 2022-03-29 DIAGNOSIS — E211 Secondary hyperparathyroidism, not elsewhere classified: Secondary | ICD-10-CM | POA: Diagnosis not present

## 2022-03-30 DIAGNOSIS — N189 Chronic kidney disease, unspecified: Secondary | ICD-10-CM | POA: Diagnosis not present

## 2022-03-30 DIAGNOSIS — N170 Acute kidney failure with tubular necrosis: Secondary | ICD-10-CM | POA: Diagnosis not present

## 2022-03-30 DIAGNOSIS — D631 Anemia in chronic kidney disease: Secondary | ICD-10-CM | POA: Diagnosis not present

## 2022-03-30 DIAGNOSIS — Z5181 Encounter for therapeutic drug level monitoring: Secondary | ICD-10-CM | POA: Diagnosis not present

## 2022-03-30 DIAGNOSIS — E1129 Type 2 diabetes mellitus with other diabetic kidney complication: Secondary | ICD-10-CM | POA: Diagnosis not present

## 2022-03-30 DIAGNOSIS — R809 Proteinuria, unspecified: Secondary | ICD-10-CM | POA: Diagnosis not present

## 2022-03-30 DIAGNOSIS — E1122 Type 2 diabetes mellitus with diabetic chronic kidney disease: Secondary | ICD-10-CM | POA: Diagnosis not present

## 2022-03-30 DIAGNOSIS — I5032 Chronic diastolic (congestive) heart failure: Secondary | ICD-10-CM | POA: Diagnosis not present

## 2022-03-30 DIAGNOSIS — E211 Secondary hyperparathyroidism, not elsewhere classified: Secondary | ICD-10-CM | POA: Diagnosis not present

## 2022-03-30 DIAGNOSIS — E559 Vitamin D deficiency, unspecified: Secondary | ICD-10-CM | POA: Diagnosis not present

## 2022-04-12 DIAGNOSIS — E119 Type 2 diabetes mellitus without complications: Secondary | ICD-10-CM | POA: Diagnosis not present

## 2022-04-12 DIAGNOSIS — E785 Hyperlipidemia, unspecified: Secondary | ICD-10-CM | POA: Diagnosis not present

## 2022-04-12 DIAGNOSIS — I10 Essential (primary) hypertension: Secondary | ICD-10-CM | POA: Diagnosis not present

## 2022-04-12 DIAGNOSIS — E538 Deficiency of other specified B group vitamins: Secondary | ICD-10-CM | POA: Diagnosis not present

## 2022-04-12 DIAGNOSIS — F0390 Unspecified dementia without behavioral disturbance: Secondary | ICD-10-CM | POA: Diagnosis not present

## 2022-04-12 DIAGNOSIS — E559 Vitamin D deficiency, unspecified: Secondary | ICD-10-CM | POA: Diagnosis not present

## 2022-04-12 DIAGNOSIS — G4700 Insomnia, unspecified: Secondary | ICD-10-CM | POA: Diagnosis not present

## 2022-04-12 DIAGNOSIS — E039 Hypothyroidism, unspecified: Secondary | ICD-10-CM | POA: Diagnosis not present

## 2022-04-18 DIAGNOSIS — I10 Essential (primary) hypertension: Secondary | ICD-10-CM | POA: Diagnosis not present

## 2022-04-20 DIAGNOSIS — I10 Essential (primary) hypertension: Secondary | ICD-10-CM | POA: Diagnosis not present

## 2022-04-20 DIAGNOSIS — E119 Type 2 diabetes mellitus without complications: Secondary | ICD-10-CM | POA: Diagnosis not present

## 2022-04-21 DIAGNOSIS — F419 Anxiety disorder, unspecified: Secondary | ICD-10-CM | POA: Diagnosis not present

## 2022-04-21 DIAGNOSIS — E663 Overweight: Secondary | ICD-10-CM | POA: Diagnosis not present

## 2022-04-21 DIAGNOSIS — F03918 Unspecified dementia, unspecified severity, with other behavioral disturbance: Secondary | ICD-10-CM | POA: Diagnosis not present

## 2022-04-28 DIAGNOSIS — E782 Mixed hyperlipidemia: Secondary | ICD-10-CM | POA: Diagnosis not present

## 2022-04-28 DIAGNOSIS — E119 Type 2 diabetes mellitus without complications: Secondary | ICD-10-CM | POA: Diagnosis not present

## 2022-04-28 DIAGNOSIS — E559 Vitamin D deficiency, unspecified: Secondary | ICD-10-CM | POA: Diagnosis not present

## 2022-04-28 DIAGNOSIS — E038 Other specified hypothyroidism: Secondary | ICD-10-CM | POA: Diagnosis not present

## 2022-04-28 DIAGNOSIS — D518 Other vitamin B12 deficiency anemias: Secondary | ICD-10-CM | POA: Diagnosis not present

## 2022-04-28 DIAGNOSIS — Z79899 Other long term (current) drug therapy: Secondary | ICD-10-CM | POA: Diagnosis not present

## 2022-05-02 DIAGNOSIS — E1129 Type 2 diabetes mellitus with other diabetic kidney complication: Secondary | ICD-10-CM | POA: Diagnosis not present

## 2022-05-02 DIAGNOSIS — E559 Vitamin D deficiency, unspecified: Secondary | ICD-10-CM | POA: Diagnosis not present

## 2022-05-02 DIAGNOSIS — I952 Hypotension due to drugs: Secondary | ICD-10-CM | POA: Diagnosis not present

## 2022-05-02 DIAGNOSIS — E1122 Type 2 diabetes mellitus with diabetic chronic kidney disease: Secondary | ICD-10-CM | POA: Diagnosis not present

## 2022-05-02 DIAGNOSIS — N189 Chronic kidney disease, unspecified: Secondary | ICD-10-CM | POA: Diagnosis not present

## 2022-05-02 DIAGNOSIS — R809 Proteinuria, unspecified: Secondary | ICD-10-CM | POA: Diagnosis not present

## 2022-05-02 DIAGNOSIS — E211 Secondary hyperparathyroidism, not elsewhere classified: Secondary | ICD-10-CM | POA: Diagnosis not present

## 2022-05-02 DIAGNOSIS — D631 Anemia in chronic kidney disease: Secondary | ICD-10-CM | POA: Diagnosis not present

## 2022-05-02 DIAGNOSIS — I5032 Chronic diastolic (congestive) heart failure: Secondary | ICD-10-CM | POA: Diagnosis not present

## 2022-05-05 DIAGNOSIS — E663 Overweight: Secondary | ICD-10-CM | POA: Diagnosis not present

## 2022-05-05 DIAGNOSIS — F419 Anxiety disorder, unspecified: Secondary | ICD-10-CM | POA: Diagnosis not present

## 2022-05-05 DIAGNOSIS — F03918 Unspecified dementia, unspecified severity, with other behavioral disturbance: Secondary | ICD-10-CM | POA: Diagnosis not present

## 2022-05-10 DIAGNOSIS — F0390 Unspecified dementia without behavioral disturbance: Secondary | ICD-10-CM | POA: Diagnosis not present

## 2022-05-10 DIAGNOSIS — E785 Hyperlipidemia, unspecified: Secondary | ICD-10-CM | POA: Diagnosis not present

## 2022-05-10 DIAGNOSIS — E119 Type 2 diabetes mellitus without complications: Secondary | ICD-10-CM | POA: Diagnosis not present

## 2022-05-10 DIAGNOSIS — I10 Essential (primary) hypertension: Secondary | ICD-10-CM | POA: Diagnosis not present

## 2022-05-18 DIAGNOSIS — I10 Essential (primary) hypertension: Secondary | ICD-10-CM | POA: Diagnosis not present

## 2022-05-19 DIAGNOSIS — F03918 Unspecified dementia, unspecified severity, with other behavioral disturbance: Secondary | ICD-10-CM | POA: Diagnosis not present

## 2022-05-19 DIAGNOSIS — F419 Anxiety disorder, unspecified: Secondary | ICD-10-CM | POA: Diagnosis not present

## 2022-05-19 DIAGNOSIS — E663 Overweight: Secondary | ICD-10-CM | POA: Diagnosis not present

## 2022-05-22 DIAGNOSIS — I10 Essential (primary) hypertension: Secondary | ICD-10-CM | POA: Diagnosis not present

## 2022-05-22 DIAGNOSIS — E782 Mixed hyperlipidemia: Secondary | ICD-10-CM | POA: Diagnosis not present

## 2022-05-22 DIAGNOSIS — D518 Other vitamin B12 deficiency anemias: Secondary | ICD-10-CM | POA: Diagnosis not present

## 2022-05-22 DIAGNOSIS — E038 Other specified hypothyroidism: Secondary | ICD-10-CM | POA: Diagnosis not present

## 2022-05-22 DIAGNOSIS — E559 Vitamin D deficiency, unspecified: Secondary | ICD-10-CM | POA: Diagnosis not present

## 2022-05-22 DIAGNOSIS — E119 Type 2 diabetes mellitus without complications: Secondary | ICD-10-CM | POA: Diagnosis not present

## 2022-05-29 DIAGNOSIS — M545 Low back pain, unspecified: Secondary | ICD-10-CM | POA: Diagnosis not present

## 2022-05-31 DIAGNOSIS — R001 Bradycardia, unspecified: Secondary | ICD-10-CM | POA: Diagnosis not present

## 2022-05-31 DIAGNOSIS — R279 Unspecified lack of coordination: Secondary | ICD-10-CM | POA: Diagnosis not present

## 2022-05-31 DIAGNOSIS — F0390 Unspecified dementia without behavioral disturbance: Secondary | ICD-10-CM | POA: Diagnosis not present

## 2022-05-31 DIAGNOSIS — T50905A Adverse effect of unspecified drugs, medicaments and biological substances, initial encounter: Secondary | ICD-10-CM | POA: Diagnosis not present

## 2022-05-31 DIAGNOSIS — I10 Essential (primary) hypertension: Secondary | ICD-10-CM | POA: Diagnosis not present

## 2022-05-31 DIAGNOSIS — R0689 Other abnormalities of breathing: Secondary | ICD-10-CM | POA: Diagnosis not present

## 2022-05-31 DIAGNOSIS — M549 Dorsalgia, unspecified: Secondary | ICD-10-CM | POA: Diagnosis not present

## 2022-05-31 DIAGNOSIS — R404 Transient alteration of awareness: Secondary | ICD-10-CM | POA: Diagnosis not present

## 2022-05-31 DIAGNOSIS — R531 Weakness: Secondary | ICD-10-CM | POA: Diagnosis not present

## 2022-05-31 DIAGNOSIS — I959 Hypotension, unspecified: Secondary | ICD-10-CM | POA: Diagnosis not present

## 2022-05-31 DIAGNOSIS — Z7401 Bed confinement status: Secondary | ICD-10-CM | POA: Diagnosis not present

## 2022-06-07 DIAGNOSIS — E039 Hypothyroidism, unspecified: Secondary | ICD-10-CM | POA: Diagnosis not present

## 2022-06-07 DIAGNOSIS — R001 Bradycardia, unspecified: Secondary | ICD-10-CM | POA: Diagnosis not present

## 2022-06-07 DIAGNOSIS — E119 Type 2 diabetes mellitus without complications: Secondary | ICD-10-CM | POA: Diagnosis not present

## 2022-06-07 DIAGNOSIS — I10 Essential (primary) hypertension: Secondary | ICD-10-CM | POA: Diagnosis not present

## 2022-06-07 DIAGNOSIS — E785 Hyperlipidemia, unspecified: Secondary | ICD-10-CM | POA: Diagnosis not present

## 2022-06-07 DIAGNOSIS — G4700 Insomnia, unspecified: Secondary | ICD-10-CM | POA: Diagnosis not present

## 2022-06-07 DIAGNOSIS — F0390 Unspecified dementia without behavioral disturbance: Secondary | ICD-10-CM | POA: Diagnosis not present

## 2022-06-15 DIAGNOSIS — F419 Anxiety disorder, unspecified: Secondary | ICD-10-CM | POA: Diagnosis not present

## 2022-06-15 DIAGNOSIS — E663 Overweight: Secondary | ICD-10-CM | POA: Diagnosis not present

## 2022-06-16 DIAGNOSIS — F419 Anxiety disorder, unspecified: Secondary | ICD-10-CM | POA: Diagnosis not present

## 2022-06-16 DIAGNOSIS — Z79899 Other long term (current) drug therapy: Secondary | ICD-10-CM | POA: Diagnosis not present

## 2022-06-16 DIAGNOSIS — F03918 Unspecified dementia, unspecified severity, with other behavioral disturbance: Secondary | ICD-10-CM | POA: Diagnosis not present

## 2022-06-16 DIAGNOSIS — E038 Other specified hypothyroidism: Secondary | ICD-10-CM | POA: Diagnosis not present

## 2022-06-16 DIAGNOSIS — E663 Overweight: Secondary | ICD-10-CM | POA: Diagnosis not present

## 2022-06-18 DIAGNOSIS — I10 Essential (primary) hypertension: Secondary | ICD-10-CM | POA: Diagnosis not present

## 2022-06-21 DIAGNOSIS — Z79899 Other long term (current) drug therapy: Secondary | ICD-10-CM | POA: Diagnosis not present

## 2022-06-21 DIAGNOSIS — E039 Hypothyroidism, unspecified: Secondary | ICD-10-CM | POA: Diagnosis not present

## 2022-06-22 DIAGNOSIS — E559 Vitamin D deficiency, unspecified: Secondary | ICD-10-CM | POA: Diagnosis not present

## 2022-06-22 DIAGNOSIS — E782 Mixed hyperlipidemia: Secondary | ICD-10-CM | POA: Diagnosis not present

## 2022-06-22 DIAGNOSIS — I10 Essential (primary) hypertension: Secondary | ICD-10-CM | POA: Diagnosis not present

## 2022-06-22 DIAGNOSIS — D518 Other vitamin B12 deficiency anemias: Secondary | ICD-10-CM | POA: Diagnosis not present

## 2022-06-22 DIAGNOSIS — E119 Type 2 diabetes mellitus without complications: Secondary | ICD-10-CM | POA: Diagnosis not present

## 2022-06-22 DIAGNOSIS — E038 Other specified hypothyroidism: Secondary | ICD-10-CM | POA: Diagnosis not present

## 2022-06-30 DIAGNOSIS — Z79899 Other long term (current) drug therapy: Secondary | ICD-10-CM | POA: Diagnosis not present

## 2022-06-30 DIAGNOSIS — E611 Iron deficiency: Secondary | ICD-10-CM | POA: Diagnosis not present

## 2022-06-30 DIAGNOSIS — E211 Secondary hyperparathyroidism, not elsewhere classified: Secondary | ICD-10-CM | POA: Diagnosis not present

## 2022-06-30 DIAGNOSIS — D518 Other vitamin B12 deficiency anemias: Secondary | ICD-10-CM | POA: Diagnosis not present

## 2022-06-30 DIAGNOSIS — B1710 Acute hepatitis C without hepatic coma: Secondary | ICD-10-CM | POA: Diagnosis not present

## 2022-07-05 DIAGNOSIS — E213 Hyperparathyroidism, unspecified: Secondary | ICD-10-CM | POA: Diagnosis not present

## 2022-07-05 DIAGNOSIS — D508 Other iron deficiency anemias: Secondary | ICD-10-CM | POA: Diagnosis not present

## 2022-07-12 DIAGNOSIS — E559 Vitamin D deficiency, unspecified: Secondary | ICD-10-CM | POA: Diagnosis not present

## 2022-07-12 DIAGNOSIS — E213 Hyperparathyroidism, unspecified: Secondary | ICD-10-CM | POA: Diagnosis not present

## 2022-07-12 DIAGNOSIS — E039 Hypothyroidism, unspecified: Secondary | ICD-10-CM | POA: Diagnosis not present

## 2022-07-12 DIAGNOSIS — E785 Hyperlipidemia, unspecified: Secondary | ICD-10-CM | POA: Diagnosis not present

## 2022-07-12 DIAGNOSIS — F0390 Unspecified dementia without behavioral disturbance: Secondary | ICD-10-CM | POA: Diagnosis not present

## 2022-07-12 DIAGNOSIS — G4700 Insomnia, unspecified: Secondary | ICD-10-CM | POA: Diagnosis not present

## 2022-07-12 DIAGNOSIS — E119 Type 2 diabetes mellitus without complications: Secondary | ICD-10-CM | POA: Diagnosis not present

## 2022-07-12 DIAGNOSIS — I10 Essential (primary) hypertension: Secondary | ICD-10-CM | POA: Diagnosis not present

## 2022-07-12 DIAGNOSIS — E538 Deficiency of other specified B group vitamins: Secondary | ICD-10-CM | POA: Diagnosis not present

## 2022-07-12 DIAGNOSIS — D508 Other iron deficiency anemias: Secondary | ICD-10-CM | POA: Diagnosis not present

## 2022-07-13 ENCOUNTER — Other Ambulatory Visit (HOSPITAL_COMMUNITY): Payer: Self-pay | Admitting: Nephrology

## 2022-07-13 DIAGNOSIS — D508 Other iron deficiency anemias: Secondary | ICD-10-CM | POA: Diagnosis not present

## 2022-07-13 DIAGNOSIS — E1122 Type 2 diabetes mellitus with diabetic chronic kidney disease: Secondary | ICD-10-CM | POA: Diagnosis not present

## 2022-07-13 DIAGNOSIS — R809 Proteinuria, unspecified: Secondary | ICD-10-CM | POA: Diagnosis not present

## 2022-07-13 DIAGNOSIS — Z6834 Body mass index (BMI) 34.0-34.9, adult: Secondary | ICD-10-CM | POA: Diagnosis not present

## 2022-07-13 DIAGNOSIS — D631 Anemia in chronic kidney disease: Secondary | ICD-10-CM | POA: Diagnosis not present

## 2022-07-13 DIAGNOSIS — N189 Chronic kidney disease, unspecified: Secondary | ICD-10-CM | POA: Diagnosis not present

## 2022-07-13 DIAGNOSIS — E1129 Type 2 diabetes mellitus with other diabetic kidney complication: Secondary | ICD-10-CM | POA: Diagnosis not present

## 2022-07-13 DIAGNOSIS — N170 Acute kidney failure with tubular necrosis: Secondary | ICD-10-CM

## 2022-07-13 DIAGNOSIS — E211 Secondary hyperparathyroidism, not elsewhere classified: Secondary | ICD-10-CM | POA: Diagnosis not present

## 2022-07-13 DIAGNOSIS — I5032 Chronic diastolic (congestive) heart failure: Secondary | ICD-10-CM | POA: Diagnosis not present

## 2022-07-14 ENCOUNTER — Telehealth: Payer: Self-pay | Admitting: *Deleted

## 2022-07-14 DIAGNOSIS — E663 Overweight: Secondary | ICD-10-CM | POA: Diagnosis not present

## 2022-07-14 DIAGNOSIS — F03918 Unspecified dementia, unspecified severity, with other behavioral disturbance: Secondary | ICD-10-CM | POA: Diagnosis not present

## 2022-07-14 DIAGNOSIS — F419 Anxiety disorder, unspecified: Secondary | ICD-10-CM | POA: Diagnosis not present

## 2022-07-14 NOTE — Progress Notes (Signed)
  Care Coordination   Note   07/14/2022 Name: Courtney [name] MRN: [id number] DOB: [birth year]  Courtney [name] is a 77 y.o. year old female who sees Courtney [name], [place] for primary care. I reached out to Courtney [name] by phone today to offer care coordination services.  Patient resides at [facility] per daughter Courtney [name]   Encounter Outcome:  Pt. Refused  [place]  Direct Dial: [phone]

## 2022-07-19 DIAGNOSIS — I10 Essential (primary) hypertension: Secondary | ICD-10-CM | POA: Diagnosis not present

## 2022-07-20 DIAGNOSIS — D518 Other vitamin B12 deficiency anemias: Secondary | ICD-10-CM | POA: Diagnosis not present

## 2022-07-20 DIAGNOSIS — E782 Mixed hyperlipidemia: Secondary | ICD-10-CM | POA: Diagnosis not present

## 2022-07-20 DIAGNOSIS — E119 Type 2 diabetes mellitus without complications: Secondary | ICD-10-CM | POA: Diagnosis not present

## 2022-07-20 DIAGNOSIS — M79671 Pain in right foot: Secondary | ICD-10-CM | POA: Diagnosis not present

## 2022-07-20 DIAGNOSIS — B351 Tinea unguium: Secondary | ICD-10-CM | POA: Diagnosis not present

## 2022-07-20 DIAGNOSIS — E038 Other specified hypothyroidism: Secondary | ICD-10-CM | POA: Diagnosis not present

## 2022-07-20 DIAGNOSIS — E559 Vitamin D deficiency, unspecified: Secondary | ICD-10-CM | POA: Diagnosis not present

## 2022-07-20 DIAGNOSIS — M79672 Pain in left foot: Secondary | ICD-10-CM | POA: Diagnosis not present

## 2022-07-20 DIAGNOSIS — I10 Essential (primary) hypertension: Secondary | ICD-10-CM | POA: Diagnosis not present

## 2022-07-20 DIAGNOSIS — L600 Ingrowing nail: Secondary | ICD-10-CM | POA: Diagnosis not present

## 2022-07-21 ENCOUNTER — Encounter: Payer: Self-pay | Admitting: Radiology

## 2022-07-28 DIAGNOSIS — E038 Other specified hypothyroidism: Secondary | ICD-10-CM | POA: Diagnosis not present

## 2022-07-28 DIAGNOSIS — Z79899 Other long term (current) drug therapy: Secondary | ICD-10-CM | POA: Diagnosis not present

## 2022-07-29 DIAGNOSIS — I10 Essential (primary) hypertension: Secondary | ICD-10-CM | POA: Diagnosis not present

## 2022-07-29 DIAGNOSIS — R278 Other lack of coordination: Secondary | ICD-10-CM | POA: Diagnosis not present

## 2022-07-29 DIAGNOSIS — M6281 Muscle weakness (generalized): Secondary | ICD-10-CM | POA: Diagnosis not present

## 2022-07-29 DIAGNOSIS — I129 Hypertensive chronic kidney disease with stage 1 through stage 4 chronic kidney disease, or unspecified chronic kidney disease: Secondary | ICD-10-CM | POA: Diagnosis not present

## 2022-07-29 DIAGNOSIS — F01B4 Vascular dementia, moderate, with anxiety: Secondary | ICD-10-CM | POA: Diagnosis not present

## 2022-08-05 DIAGNOSIS — I129 Hypertensive chronic kidney disease with stage 1 through stage 4 chronic kidney disease, or unspecified chronic kidney disease: Secondary | ICD-10-CM | POA: Diagnosis not present

## 2022-08-05 DIAGNOSIS — M6281 Muscle weakness (generalized): Secondary | ICD-10-CM | POA: Diagnosis not present

## 2022-08-05 DIAGNOSIS — I10 Essential (primary) hypertension: Secondary | ICD-10-CM | POA: Diagnosis not present

## 2022-08-05 DIAGNOSIS — F01B4 Vascular dementia, moderate, with anxiety: Secondary | ICD-10-CM | POA: Diagnosis not present

## 2022-08-05 DIAGNOSIS — R278 Other lack of coordination: Secondary | ICD-10-CM | POA: Diagnosis not present

## 2022-08-08 ENCOUNTER — Ambulatory Visit (HOSPITAL_COMMUNITY)
Admission: RE | Admit: 2022-08-08 | Discharge: 2022-08-08 | Disposition: A | Discharge status: Home / Self Care | Payer: PPO | Source: Ambulatory Visit | Attending: Nephrology | Admitting: Nephrology

## 2022-08-08 DIAGNOSIS — N179 Acute kidney failure, unspecified: Secondary | ICD-10-CM | POA: Diagnosis not present

## 2022-08-08 DIAGNOSIS — E1122 Type 2 diabetes mellitus with diabetic chronic kidney disease: Secondary | ICD-10-CM | POA: Diagnosis not present

## 2022-08-08 DIAGNOSIS — N189 Chronic kidney disease, unspecified: Secondary | ICD-10-CM | POA: Insufficient documentation

## 2022-08-08 DIAGNOSIS — D508 Other iron deficiency anemias: Secondary | ICD-10-CM

## 2022-08-08 DIAGNOSIS — D631 Anemia in chronic kidney disease: Secondary | ICD-10-CM

## 2022-08-08 DIAGNOSIS — E1129 Type 2 diabetes mellitus with other diabetic kidney complication: Secondary | ICD-10-CM | POA: Diagnosis not present

## 2022-08-08 DIAGNOSIS — N170 Acute kidney failure with tubular necrosis: Secondary | ICD-10-CM | POA: Diagnosis not present

## 2022-08-08 DIAGNOSIS — R809 Proteinuria, unspecified: Secondary | ICD-10-CM | POA: Insufficient documentation

## 2022-08-10 DIAGNOSIS — R278 Other lack of coordination: Secondary | ICD-10-CM | POA: Diagnosis not present

## 2022-08-10 DIAGNOSIS — I129 Hypertensive chronic kidney disease with stage 1 through stage 4 chronic kidney disease, or unspecified chronic kidney disease: Secondary | ICD-10-CM | POA: Diagnosis not present

## 2022-08-10 DIAGNOSIS — I10 Essential (primary) hypertension: Secondary | ICD-10-CM | POA: Diagnosis not present

## 2022-08-10 DIAGNOSIS — M6281 Muscle weakness (generalized): Secondary | ICD-10-CM | POA: Diagnosis not present

## 2022-08-10 DIAGNOSIS — F01B4 Vascular dementia, moderate, with anxiety: Secondary | ICD-10-CM | POA: Diagnosis not present

## 2022-08-14 DIAGNOSIS — Z79899 Other long term (current) drug therapy: Secondary | ICD-10-CM | POA: Diagnosis not present

## 2022-08-16 DIAGNOSIS — N390 Urinary tract infection, site not specified: Secondary | ICD-10-CM | POA: Diagnosis not present

## 2022-08-17 DIAGNOSIS — F01B4 Vascular dementia, moderate, with anxiety: Secondary | ICD-10-CM | POA: Diagnosis not present

## 2022-08-17 DIAGNOSIS — E119 Type 2 diabetes mellitus without complications: Secondary | ICD-10-CM | POA: Diagnosis not present

## 2022-08-17 DIAGNOSIS — E038 Other specified hypothyroidism: Secondary | ICD-10-CM | POA: Diagnosis not present

## 2022-08-17 DIAGNOSIS — D518 Other vitamin B12 deficiency anemias: Secondary | ICD-10-CM | POA: Diagnosis not present

## 2022-08-17 DIAGNOSIS — I10 Essential (primary) hypertension: Secondary | ICD-10-CM | POA: Diagnosis not present

## 2022-08-17 DIAGNOSIS — M6281 Muscle weakness (generalized): Secondary | ICD-10-CM | POA: Diagnosis not present

## 2022-08-17 DIAGNOSIS — I129 Hypertensive chronic kidney disease with stage 1 through stage 4 chronic kidney disease, or unspecified chronic kidney disease: Secondary | ICD-10-CM | POA: Diagnosis not present

## 2022-08-17 DIAGNOSIS — R278 Other lack of coordination: Secondary | ICD-10-CM | POA: Diagnosis not present

## 2022-08-17 DIAGNOSIS — B1710 Acute hepatitis C without hepatic coma: Secondary | ICD-10-CM | POA: Diagnosis not present

## 2022-08-17 DIAGNOSIS — E559 Vitamin D deficiency, unspecified: Secondary | ICD-10-CM | POA: Diagnosis not present

## 2022-08-17 DIAGNOSIS — E782 Mixed hyperlipidemia: Secondary | ICD-10-CM | POA: Diagnosis not present

## 2022-08-18 DIAGNOSIS — D518 Other vitamin B12 deficiency anemias: Secondary | ICD-10-CM | POA: Diagnosis not present

## 2022-08-18 DIAGNOSIS — Z79899 Other long term (current) drug therapy: Secondary | ICD-10-CM | POA: Diagnosis not present

## 2022-08-18 DIAGNOSIS — B1710 Acute hepatitis C without hepatic coma: Secondary | ICD-10-CM | POA: Diagnosis not present

## 2022-08-18 DIAGNOSIS — F419 Anxiety disorder, unspecified: Secondary | ICD-10-CM | POA: Diagnosis not present

## 2022-08-18 DIAGNOSIS — F03918 Unspecified dementia, unspecified severity, with other behavioral disturbance: Secondary | ICD-10-CM | POA: Diagnosis not present

## 2022-08-18 DIAGNOSIS — F5105 Insomnia due to other mental disorder: Secondary | ICD-10-CM | POA: Diagnosis not present

## 2022-08-18 DIAGNOSIS — E211 Secondary hyperparathyroidism, not elsewhere classified: Secondary | ICD-10-CM | POA: Diagnosis not present

## 2022-08-18 DIAGNOSIS — F329 Major depressive disorder, single episode, unspecified: Secondary | ICD-10-CM | POA: Diagnosis not present

## 2022-08-22 DIAGNOSIS — R278 Other lack of coordination: Secondary | ICD-10-CM | POA: Diagnosis not present

## 2022-08-22 DIAGNOSIS — F01B4 Vascular dementia, moderate, with anxiety: Secondary | ICD-10-CM | POA: Diagnosis not present

## 2022-08-22 DIAGNOSIS — M6281 Muscle weakness (generalized): Secondary | ICD-10-CM | POA: Diagnosis not present

## 2022-08-22 DIAGNOSIS — I10 Essential (primary) hypertension: Secondary | ICD-10-CM | POA: Diagnosis not present

## 2022-08-22 DIAGNOSIS — I129 Hypertensive chronic kidney disease with stage 1 through stage 4 chronic kidney disease, or unspecified chronic kidney disease: Secondary | ICD-10-CM | POA: Diagnosis not present

## 2022-08-23 DIAGNOSIS — E039 Hypothyroidism, unspecified: Secondary | ICD-10-CM | POA: Diagnosis not present

## 2022-08-23 DIAGNOSIS — E785 Hyperlipidemia, unspecified: Secondary | ICD-10-CM | POA: Diagnosis not present

## 2022-08-23 DIAGNOSIS — F0390 Unspecified dementia without behavioral disturbance: Secondary | ICD-10-CM | POA: Diagnosis not present

## 2022-08-23 DIAGNOSIS — E559 Vitamin D deficiency, unspecified: Secondary | ICD-10-CM | POA: Diagnosis not present

## 2022-08-23 DIAGNOSIS — F32A Depression, unspecified: Secondary | ICD-10-CM | POA: Diagnosis not present

## 2022-08-23 DIAGNOSIS — D508 Other iron deficiency anemias: Secondary | ICD-10-CM | POA: Diagnosis not present

## 2022-08-23 DIAGNOSIS — E119 Type 2 diabetes mellitus without complications: Secondary | ICD-10-CM | POA: Diagnosis not present

## 2022-08-23 DIAGNOSIS — G4700 Insomnia, unspecified: Secondary | ICD-10-CM | POA: Diagnosis not present

## 2022-08-23 DIAGNOSIS — E213 Hyperparathyroidism, unspecified: Secondary | ICD-10-CM | POA: Diagnosis not present

## 2022-08-24 DIAGNOSIS — E1122 Type 2 diabetes mellitus with diabetic chronic kidney disease: Secondary | ICD-10-CM | POA: Diagnosis not present

## 2022-08-24 DIAGNOSIS — N170 Acute kidney failure with tubular necrosis: Secondary | ICD-10-CM | POA: Diagnosis not present

## 2022-08-24 DIAGNOSIS — R031 Nonspecific low blood-pressure reading: Secondary | ICD-10-CM | POA: Diagnosis not present

## 2022-08-24 DIAGNOSIS — E1129 Type 2 diabetes mellitus with other diabetic kidney complication: Secondary | ICD-10-CM | POA: Diagnosis not present

## 2022-08-24 DIAGNOSIS — R809 Proteinuria, unspecified: Secondary | ICD-10-CM | POA: Diagnosis not present

## 2022-08-24 DIAGNOSIS — D508 Other iron deficiency anemias: Secondary | ICD-10-CM | POA: Diagnosis not present

## 2022-08-24 DIAGNOSIS — N189 Chronic kidney disease, unspecified: Secondary | ICD-10-CM | POA: Diagnosis not present

## 2022-08-24 DIAGNOSIS — D631 Anemia in chronic kidney disease: Secondary | ICD-10-CM | POA: Diagnosis not present

## 2022-08-24 DIAGNOSIS — E211 Secondary hyperparathyroidism, not elsewhere classified: Secondary | ICD-10-CM | POA: Diagnosis not present

## 2022-08-26 DIAGNOSIS — M6281 Muscle weakness (generalized): Secondary | ICD-10-CM | POA: Diagnosis not present

## 2022-08-26 DIAGNOSIS — R278 Other lack of coordination: Secondary | ICD-10-CM | POA: Diagnosis not present

## 2022-08-26 DIAGNOSIS — F01B4 Vascular dementia, moderate, with anxiety: Secondary | ICD-10-CM | POA: Diagnosis not present

## 2022-08-26 DIAGNOSIS — I129 Hypertensive chronic kidney disease with stage 1 through stage 4 chronic kidney disease, or unspecified chronic kidney disease: Secondary | ICD-10-CM | POA: Diagnosis not present

## 2022-08-26 DIAGNOSIS — I10 Essential (primary) hypertension: Secondary | ICD-10-CM | POA: Diagnosis not present

## 2022-08-29 ENCOUNTER — Encounter: Payer: Self-pay | Admitting: "Endocrinology

## 2022-08-29 DIAGNOSIS — R278 Other lack of coordination: Secondary | ICD-10-CM | POA: Diagnosis not present

## 2022-08-29 DIAGNOSIS — I10 Essential (primary) hypertension: Secondary | ICD-10-CM | POA: Diagnosis not present

## 2022-08-29 DIAGNOSIS — M6281 Muscle weakness (generalized): Secondary | ICD-10-CM | POA: Diagnosis not present

## 2022-08-29 DIAGNOSIS — I129 Hypertensive chronic kidney disease with stage 1 through stage 4 chronic kidney disease, or unspecified chronic kidney disease: Secondary | ICD-10-CM | POA: Diagnosis not present

## 2022-08-29 DIAGNOSIS — F01B4 Vascular dementia, moderate, with anxiety: Secondary | ICD-10-CM | POA: Diagnosis not present

## 2022-08-31 ENCOUNTER — Ambulatory Visit: Payer: PPO | Admitting: "Endocrinology

## 2022-08-31 DIAGNOSIS — I129 Hypertensive chronic kidney disease with stage 1 through stage 4 chronic kidney disease, or unspecified chronic kidney disease: Secondary | ICD-10-CM | POA: Diagnosis not present

## 2022-08-31 DIAGNOSIS — R278 Other lack of coordination: Secondary | ICD-10-CM | POA: Diagnosis not present

## 2022-08-31 DIAGNOSIS — I10 Essential (primary) hypertension: Secondary | ICD-10-CM | POA: Diagnosis not present

## 2022-08-31 DIAGNOSIS — F01B4 Vascular dementia, moderate, with anxiety: Secondary | ICD-10-CM | POA: Diagnosis not present

## 2022-08-31 DIAGNOSIS — M6281 Muscle weakness (generalized): Secondary | ICD-10-CM | POA: Diagnosis not present

## 2022-09-05 DIAGNOSIS — F01B4 Vascular dementia, moderate, with anxiety: Secondary | ICD-10-CM | POA: Diagnosis not present

## 2022-09-05 DIAGNOSIS — I10 Essential (primary) hypertension: Secondary | ICD-10-CM | POA: Diagnosis not present

## 2022-09-05 DIAGNOSIS — M6281 Muscle weakness (generalized): Secondary | ICD-10-CM | POA: Diagnosis not present

## 2022-09-05 DIAGNOSIS — I129 Hypertensive chronic kidney disease with stage 1 through stage 4 chronic kidney disease, or unspecified chronic kidney disease: Secondary | ICD-10-CM | POA: Diagnosis not present

## 2022-09-05 DIAGNOSIS — R278 Other lack of coordination: Secondary | ICD-10-CM | POA: Diagnosis not present

## 2022-09-08 DIAGNOSIS — R278 Other lack of coordination: Secondary | ICD-10-CM | POA: Diagnosis not present

## 2022-09-08 DIAGNOSIS — F01B4 Vascular dementia, moderate, with anxiety: Secondary | ICD-10-CM | POA: Diagnosis not present

## 2022-09-08 DIAGNOSIS — I10 Essential (primary) hypertension: Secondary | ICD-10-CM | POA: Diagnosis not present

## 2022-09-08 DIAGNOSIS — M6281 Muscle weakness (generalized): Secondary | ICD-10-CM | POA: Diagnosis not present

## 2022-09-08 DIAGNOSIS — I129 Hypertensive chronic kidney disease with stage 1 through stage 4 chronic kidney disease, or unspecified chronic kidney disease: Secondary | ICD-10-CM | POA: Diagnosis not present

## 2022-09-13 DIAGNOSIS — I10 Essential (primary) hypertension: Secondary | ICD-10-CM | POA: Diagnosis not present

## 2022-09-13 DIAGNOSIS — R278 Other lack of coordination: Secondary | ICD-10-CM | POA: Diagnosis not present

## 2022-09-13 DIAGNOSIS — I129 Hypertensive chronic kidney disease with stage 1 through stage 4 chronic kidney disease, or unspecified chronic kidney disease: Secondary | ICD-10-CM | POA: Diagnosis not present

## 2022-09-13 DIAGNOSIS — M6281 Muscle weakness (generalized): Secondary | ICD-10-CM | POA: Diagnosis not present

## 2022-09-13 DIAGNOSIS — F01B4 Vascular dementia, moderate, with anxiety: Secondary | ICD-10-CM | POA: Diagnosis not present

## 2022-09-14 DIAGNOSIS — E119 Type 2 diabetes mellitus without complications: Secondary | ICD-10-CM | POA: Diagnosis not present

## 2022-09-14 DIAGNOSIS — E782 Mixed hyperlipidemia: Secondary | ICD-10-CM | POA: Diagnosis not present

## 2022-09-14 DIAGNOSIS — E038 Other specified hypothyroidism: Secondary | ICD-10-CM | POA: Diagnosis not present

## 2022-09-14 DIAGNOSIS — I10 Essential (primary) hypertension: Secondary | ICD-10-CM | POA: Diagnosis not present

## 2022-09-14 DIAGNOSIS — D518 Other vitamin B12 deficiency anemias: Secondary | ICD-10-CM | POA: Diagnosis not present

## 2022-09-14 DIAGNOSIS — B1710 Acute hepatitis C without hepatic coma: Secondary | ICD-10-CM | POA: Diagnosis not present

## 2022-09-14 DIAGNOSIS — E559 Vitamin D deficiency, unspecified: Secondary | ICD-10-CM | POA: Diagnosis not present

## 2022-09-15 DIAGNOSIS — R278 Other lack of coordination: Secondary | ICD-10-CM | POA: Diagnosis not present

## 2022-09-15 DIAGNOSIS — M6281 Muscle weakness (generalized): Secondary | ICD-10-CM | POA: Diagnosis not present

## 2022-09-15 DIAGNOSIS — I10 Essential (primary) hypertension: Secondary | ICD-10-CM | POA: Diagnosis not present

## 2022-09-15 DIAGNOSIS — F03918 Unspecified dementia, unspecified severity, with other behavioral disturbance: Secondary | ICD-10-CM | POA: Diagnosis not present

## 2022-09-15 DIAGNOSIS — F329 Major depressive disorder, single episode, unspecified: Secondary | ICD-10-CM | POA: Diagnosis not present

## 2022-09-15 DIAGNOSIS — F01B4 Vascular dementia, moderate, with anxiety: Secondary | ICD-10-CM | POA: Diagnosis not present

## 2022-09-15 DIAGNOSIS — F5105 Insomnia due to other mental disorder: Secondary | ICD-10-CM | POA: Diagnosis not present

## 2022-09-15 DIAGNOSIS — F419 Anxiety disorder, unspecified: Secondary | ICD-10-CM | POA: Diagnosis not present

## 2022-09-15 DIAGNOSIS — I129 Hypertensive chronic kidney disease with stage 1 through stage 4 chronic kidney disease, or unspecified chronic kidney disease: Secondary | ICD-10-CM | POA: Diagnosis not present

## 2022-09-17 DIAGNOSIS — E1165 Type 2 diabetes mellitus with hyperglycemia: Secondary | ICD-10-CM | POA: Diagnosis not present

## 2022-09-20 DIAGNOSIS — I129 Hypertensive chronic kidney disease with stage 1 through stage 4 chronic kidney disease, or unspecified chronic kidney disease: Secondary | ICD-10-CM | POA: Diagnosis not present

## 2022-09-20 DIAGNOSIS — E785 Hyperlipidemia, unspecified: Secondary | ICD-10-CM | POA: Diagnosis not present

## 2022-09-20 DIAGNOSIS — E538 Deficiency of other specified B group vitamins: Secondary | ICD-10-CM | POA: Diagnosis not present

## 2022-09-20 DIAGNOSIS — E039 Hypothyroidism, unspecified: Secondary | ICD-10-CM | POA: Diagnosis not present

## 2022-09-20 DIAGNOSIS — I10 Essential (primary) hypertension: Secondary | ICD-10-CM | POA: Diagnosis not present

## 2022-09-20 DIAGNOSIS — F01B4 Vascular dementia, moderate, with anxiety: Secondary | ICD-10-CM | POA: Diagnosis not present

## 2022-09-20 DIAGNOSIS — R278 Other lack of coordination: Secondary | ICD-10-CM | POA: Diagnosis not present

## 2022-09-20 DIAGNOSIS — M6281 Muscle weakness (generalized): Secondary | ICD-10-CM | POA: Diagnosis not present

## 2022-09-20 DIAGNOSIS — F32A Depression, unspecified: Secondary | ICD-10-CM | POA: Diagnosis not present

## 2022-09-20 DIAGNOSIS — F0390 Unspecified dementia without behavioral disturbance: Secondary | ICD-10-CM | POA: Diagnosis not present

## 2022-09-20 DIAGNOSIS — G4700 Insomnia, unspecified: Secondary | ICD-10-CM | POA: Diagnosis not present

## 2022-09-20 DIAGNOSIS — E213 Hyperparathyroidism, unspecified: Secondary | ICD-10-CM | POA: Diagnosis not present

## 2022-09-20 DIAGNOSIS — E119 Type 2 diabetes mellitus without complications: Secondary | ICD-10-CM | POA: Diagnosis not present

## 2022-09-20 DIAGNOSIS — D508 Other iron deficiency anemias: Secondary | ICD-10-CM | POA: Diagnosis not present

## 2022-09-20 DIAGNOSIS — E559 Vitamin D deficiency, unspecified: Secondary | ICD-10-CM | POA: Diagnosis not present

## 2022-09-22 ENCOUNTER — Ambulatory Visit (INDEPENDENT_AMBULATORY_CARE_PROVIDER_SITE_OTHER): Payer: PPO | Admitting: "Endocrinology

## 2022-09-22 ENCOUNTER — Encounter: Payer: Self-pay | Admitting: "Endocrinology

## 2022-09-22 VITALS — BP 132/64 | HR 60

## 2022-09-22 DIAGNOSIS — E212 Other hyperparathyroidism: Secondary | ICD-10-CM | POA: Diagnosis not present

## 2022-09-22 DIAGNOSIS — E039 Hypothyroidism, unspecified: Secondary | ICD-10-CM | POA: Diagnosis not present

## 2022-09-22 MED ORDER — CALCITRIOL 0.25 MCG PO CAPS: 0.2500 ug | ORAL_CAPSULE | Freq: Every day | ORAL | 3 refills | Status: AC

## 2022-09-22 NOTE — Progress Notes (Signed)
Endocrinology Consult Note                                            09/22/2022, 5:11 PM   Subjective:    Patient ID: Courtney [name], female    DOB: Nov 19, 1945, PCP Emiliano Dyer, FNP   Past Medical History:  Diagnosis Date   Alzheimer's dementia (HCC) 2015   Cancer (HCC)    Diabetes (HCC)    Hypertension    Past Surgical History:  Procedure Laterality Date   ABDOMINAL HYSTERECTOMY     COLONOSCOPY N/A 05/14/2015   Procedure: COLONOSCOPY;  Surgeon: Malissa Hippo, MD;  Location: [facility];  Service: Endoscopy;  Laterality: N/A;  730   OOPHORECTOMY     Social History   Socioeconomic History   Marital status: Married    Spouse name: Not on file   Number of children: Not on file   Years of education: Not on file   Highest education level: Not on file  Occupational History   Not on file  Tobacco Use   Smoking status: Former    Packs/day: 0.50    Years: 10.00    Additional pack years: 0.00    Total pack years: 5.00    Types: Cigarettes    Quit date: 05/22/2004    Years since quitting: 18.3   Smokeless tobacco: Never  Vaping Use   Vaping Use: Never used  Substance and Sexual Activity   Alcohol use: Yes    Alcohol/week: 0.0 standard drinks of alcohol    Comment: social   Drug use: No   Sexual activity: Never  Other Topics Concern   Not on file  Social History Narrative   Not on file   Social Determinants of Health   Financial Resource Strain: Not on file  Food Insecurity: Not on file  Transportation Needs: Not on file  Physical Activity: Not on file  Stress: Not on file  Social Connections: Not on file   Family History  Problem Relation Age of Onset   Rheum arthritis Mother    Heart failure Mother    Hypertension Mother    Stroke Father    Diabetes Brother    Diabetes Sister    Hypertension Maternal Grandmother    Outpatient Encounter Medications as of 09/22/2022  Medication Sig   cholecalciferol (VITAMIN D3) 25 MCG (1000 UNIT)  tablet Take 1,000 Units by mouth daily.   furosemide (LASIX) 80 MG tablet Take 80 mg by mouth daily.   labetalol (NORMODYNE) 300 MG tablet Take 300 mg by mouth 2 (two) times daily.   levothyroxine (SYNTHROID) 175 MCG tablet Take 175 mcg by mouth daily before breakfast.   loperamide (IMODIUM A-D) 2 MG tablet Take 2 mg by mouth as needed for diarrhea or loose stools.   acetaminophen (TYLENOL) 325 MG tablet Take 2 tablets (650 mg total) by mouth every 6 (six) hours as needed for mild pain, fever or headache (or Fever >/= 101).   Aspirin (VAZALORE) 81 MG CAPS Take by mouth.   atorvastatin (LIPITOR) 40 MG tablet Take 1 tablet (40 mg total) by mouth every morning.   calcitRIOL (ROCALTROL) 0.25 MCG capsule Take 1 capsule (0.25 mcg total) by mouth daily with lunch.   cyanocobalamin 1000 MCG tablet Take by mouth.   donepezil (ARICEPT) 10 MG tablet Take 1 tablet (10  mg total) by mouth at bedtime.   ferrous sulfate 325 (65 FE) MG tablet Take 1 tablet (325 mg total) by mouth daily with breakfast.   folic acid (FOLVITE) 1 MG tablet Take 1 mg by mouth 2 (two) times daily.   insulin glargine (LANTUS SOLOSTAR) 100 UNIT/ML Solostar Pen Inject 40 Units into the skin at bedtime. (Patient taking differently: Inject 12 Units into the skin at bedtime.)   memantine (NAMENDA) 5 MG tablet Take 5 mg by mouth 2 (two) times daily.   MODERNA COVID-19 BIVAL BOOSTER 50 MCG/0.5ML injection    QUEtiapine (SEROQUEL) 25 MG tablet Take 1 tablet (25 mg total) by mouth daily after supper. (Patient taking differently: Take 25 mg by mouth 2 (two) times daily.)   sertraline (ZOLOFT) 100 MG tablet Take 1 tablet (100 mg total) by mouth every morning. (Patient taking differently: Take 50 mg by mouth every morning.)   TRADJENTA 5 MG TABS tablet Take 5 mg by mouth daily.   UNABLE TO FIND Diet - NAS, ConCHO   [DISCONTINUED] amLODipine (NORVASC) 5 MG tablet Take 1 tablet (5 mg total) by mouth daily. (Patient not taking: Reported on 09/22/2022)    [DISCONTINUED] aspirin EC 325 MG tablet Take 1 tablet (325 mg total) by mouth daily. (Patient not taking: Reported on 09/22/2022)   [DISCONTINUED] benzonatate (TESSALON) 100 MG capsule Take 1 capsule (100 mg total) by mouth 2 (two) times daily as needed for cough. (Patient not taking: Reported on 09/22/2022)   [DISCONTINUED] calcitRIOL (ROCALTROL) 0.25 MCG capsule Take 1 capsule (0.25 mcg total) by mouth every other day. Once A Day on Mon, Wed, Fri, for stage 3b CKD   [DISCONTINUED] furosemide (LASIX) 40 MG tablet Take 1 tablet (40 mg total) by mouth daily. (Patient taking differently: Take 80 mg by mouth daily.)   [DISCONTINUED] labetalol (NORMODYNE) 200 MG tablet Take 2 tablets (400 mg total) by mouth 2 (two) times daily.   [DISCONTINUED] levothyroxine (SYNTHROID) 150 MCG tablet Take 1 tablet (150 mcg total) by mouth every morning. (Patient not taking: Reported on 09/22/2022)   [DISCONTINUED] lisinopril (ZESTRIL) 5 MG tablet Take 1 tablet (5 mg total) by mouth daily. (Patient not taking: Reported on 09/22/2022)   [DISCONTINUED] memantine (NAMENDA XR) 14 MG CP24 24 hr capsule Take 1 capsule (14 mg total) by mouth daily for 7 days.   [DISCONTINUED] memantine (NAMENDA XR) 21 MG CP24 24 hr capsule Take 1 capsule (21 mg total) by mouth daily for 7 days.   [DISCONTINUED] memantine (NAMENDA XR) 28 MG CP24 24 hr capsule Take 1 capsule (28 mg total) by mouth daily.   [DISCONTINUED] memantine (NAMENDA XR) 7 MG CP24 24 hr capsule Take 1 capsule (7 mg total) by mouth daily for 3 days.   [DISCONTINUED] Omega 3-6-9 Fatty Acids (OMEGA DHA PO) Take 1 tablet by mouth daily. [phone] mg (Patient not taking: Reported on 09/22/2022)   No facility-administered encounter medications on file as of 09/22/2022.   ALLERGIES: No Known Allergies  VACCINATION STATUS: Immunization History  Administered Date(s) Administered   Moderna SARS-COV2 Booster Vaccination 04/12/2020   Moderna Sars-Covid-2 Vaccination 07/10/2019, 08/07/2019    Pneumococcal Polysaccharide-23 08/21/2019    HPI Courtney [name] is 77 y.o. female who presents today with a medical history as above. she is being seen in consultation for elevated PTH requested by Emiliano Dyer, FNP. Patient is a nursing home resident with significant cognitive deficit.  She is accompanied by her husband.  She is wheelchair-bound.  History is obtained  from the couple, however mostly from chart review.  She does have stage IV CKD on nephrology follow-up.  She was found to have elevated PTH several weeks ago without hypercalcemia. No documented bone density in the system.  She is not on weightbearing ambulation, on wheelchair due to disequilibrium. There is no history of nephrolithiasis.  She has hypothyroidism for which she is on levothyroxine 175 mcg p.o. daily before breakfast.  She does not recall neck surgery or upper thyroid ablation. She is on polypharmacy related to her history of hypertension, dementia, vitamin deficiencies, hyperlipidemia, mood disorders.  She also has type 2 diabetes for which she is on Tradjenta 5 mg p.o. daily at breakfast.  Her most recent A1c was 7.6%.    Review of Systems  Constitutional: + Mildly fluctuating body weight, currently weighs 209 pounds.  She reports ongoing fatigue,  no subjective hyperthermia, no subjective hypothermia Eyes: no blurry vision, no xerophthalmia ENT: no sore throat, no nodules palpated in throat, no dysphagia/odynophagia, no hoarseness Cardiovascular: no Chest Pain, no Shortness of Breath, no palpitations, no leg swelling Respiratory: no cough, no shortness of breath Gastrointestinal: no Nausea/Vomiting/Diarhhea Musculoskeletal: + Wheelchair-bound at baseline, no muscle/joint aches Skin: no rashes Neurological: no tremors, no numbness, no tingling, no dizziness Psychiatric: no depression, no anxiety  Objective:       09/22/2022    2:40 PM 06/21/2021    2:34 PM 06/21/2021    1:30 PM  Vitals with BMI   Systolic 132 121 161  Diastolic 64 53 57  Pulse 60 50 54    BP 132/64   Pulse 60   Wt Readings from Last 3 Encounters:  06/21/21 209 lb (94.8 kg)  09/30/20 224 lb (101.6 kg)  09/27/20 213 lb (96.6 kg)    Physical Exam  Constitutional:  There is no height or weight on file to calculate BMI.,  not in acute distress, normal state of mind Eyes: PERRLA, EOMI, no exophthalmos ENT: moist mucous membranes, no gross thyromegaly, no gross cervical lymphadenopathy Cardiovascular: normal precordial activity, Regular Rate and Rhythm, no Murmur/Rubs/Gallops Respiratory:  adequate breathing efforts, no gross chest deformity, Clear to auscultation bilaterally Gastrointestinal: abdomen soft, Non -tender, No distension, Bowel Sounds present, no gross organomegaly Musculoskeletal: no gross deformities, strength intact in all four extremities, + mild peripheral edema.   Skin: moist, warm, no rashes Neurological: + Wheelchair-bound at baseline, no tremor with outstretched hands, Deep tendon reflexes normal in bilateral lower extremities.  CMP ( most recent) CMP     Component Value Date/Time   NA 137 06/21/2021 1057   K 4.1 06/21/2021 1057   CL 109 06/21/2021 1057   CO2 21 (L) 06/21/2021 1057   GLUCOSE 162 (H) 06/21/2021 1057   BUN 46 (H) 06/21/2021 1057   CREATININE 1.95 (H) 06/21/2021 1057   CALCIUM 8.6 (L) 06/21/2021 1057   PROT 6.7 09/27/2020 1220   ALBUMIN 3.5 09/27/2020 1220   AST 19 09/27/2020 1220   ALT 16 09/27/2020 1220   ALKPHOS 46 09/27/2020 1220   BILITOT 0.6 09/27/2020 1220   GFRNONAA 26 (L) 06/21/2021 1057   GFRAA 32 (L) 08/20/2019 1453     Diabetic Labs (most recent): Lab Results  Component Value Date   HGBA1C 7.6 (H) 09/27/2020   HGBA1C 7.9 (H) 08/20/2019     Lipid Panel ( most recent) Lipid Panel     Component Value Date/Time   CHOL 220 (H) 08/21/2019 0515   TRIG 328 (H) 08/21/2019 0515   HDL 44 08/21/2019  0515   CHOLHDL 5.0 08/21/2019 0515   VLDL 66 (H)  08/21/2019 0515   LDLCALC 110 (H) 08/21/2019 0515      Lab Results  Component Value Date   TSH 1.189 09/27/2020   TSH 0.436 08/20/2019   TSH 154.535 (H) 01/22/2019   TSH 6.240 (H) 04/24/2014           Assessment & Plan:   1. Other hyperparathyroidism (HCC) 2. Hypothyroidism, unspecified type    - Courtney [name]  is being seen at a kind request of Emiliano Dyer, FNP. - I have reviewed her available  records and clinically evaluated the patient. - Based on these reviews, she has elevated PTH in the background of CKD stage 4,  however,  there is not sufficient information to proceed with definitive treatment plan.  It is likely that she has secondary hyperparathyroidism. -No history suggesting primary hyperparathyroidism. Discussed and suggested a change in her frequency of calcitriol 0.25 mcg p.o. daily. She will have repeat labs including - Magnesium - PTH, intact and calcium - Phosphorus - PTH-related peptide - VITAMIN D 25 Hydroxy (Vit-D Deficiency, Fractures)  If her labs indicate hypercalcemia, she will be considered for bone density, not practical to do 24-hour urine studies. If she is confirmed to have primary or tertiary hyperparathyroidism, she is not a surgical candidate.  She will be considered for Sensipar intervention if necessary.    For her hypothyroidism, she is advised to continue levothyroxine 175 mcg p.o. daily before breakfast.  This dose seems to be above target for her, however due to the fact that she is receiving her levothyroxine along with her other medications, this may be suboptimal absorption. She will have repeat TSH/free T4 along with her next labs.   - she is advised to maintain close follow up with Emiliano Dyer, FNP for primary care needs.   - Time spent with the patient: 45 minutes, of which >50% was spent in  counseling her about her elevated PTH, hypothyroidism and the rest in obtaining information about her symptoms, reviewing  her previous labs/studies ( including abstractions from other facilities),  evaluations, and treatments,  and developing a plan to confirm diagnosis and long term treatment based on the latest standards of care/guidelines; and documenting her care.  Courtney [name] participated in the discussions, expressed understanding, and voiced agreement with the above plans.  All questions were answered to her satisfaction. she is encouraged to contact clinic should she have any questions or concerns prior to her return visit.  Follow up plan: Return in about 6 weeks (around 11/03/2022) for F/U with Pre-visit Labs.   Marquis Lunch, MD [facility] [address] [place], [place] [address] Phone: [phone]  Fax: [phone]     09/22/2022, 5:11 PM  This note was partially dictated with voice recognition software. Similar sounding words can be transcribed inadequately or may not  be corrected upon review.

## 2022-09-26 DIAGNOSIS — M6281 Muscle weakness (generalized): Secondary | ICD-10-CM | POA: Diagnosis not present

## 2022-09-26 DIAGNOSIS — I129 Hypertensive chronic kidney disease with stage 1 through stage 4 chronic kidney disease, or unspecified chronic kidney disease: Secondary | ICD-10-CM | POA: Diagnosis not present

## 2022-09-26 DIAGNOSIS — F01B4 Vascular dementia, moderate, with anxiety: Secondary | ICD-10-CM | POA: Diagnosis not present

## 2022-09-26 DIAGNOSIS — I10 Essential (primary) hypertension: Secondary | ICD-10-CM | POA: Diagnosis not present

## 2022-09-26 DIAGNOSIS — R278 Other lack of coordination: Secondary | ICD-10-CM | POA: Diagnosis not present

## 2022-09-29 DIAGNOSIS — I10 Essential (primary) hypertension: Secondary | ICD-10-CM | POA: Diagnosis not present

## 2022-09-29 DIAGNOSIS — R278 Other lack of coordination: Secondary | ICD-10-CM | POA: Diagnosis not present

## 2022-09-29 DIAGNOSIS — I129 Hypertensive chronic kidney disease with stage 1 through stage 4 chronic kidney disease, or unspecified chronic kidney disease: Secondary | ICD-10-CM | POA: Diagnosis not present

## 2022-09-29 DIAGNOSIS — M6281 Muscle weakness (generalized): Secondary | ICD-10-CM | POA: Diagnosis not present

## 2022-09-29 DIAGNOSIS — F01B4 Vascular dementia, moderate, with anxiety: Secondary | ICD-10-CM | POA: Diagnosis not present

## 2022-10-04 DIAGNOSIS — R011 Cardiac murmur, unspecified: Secondary | ICD-10-CM | POA: Diagnosis not present

## 2022-10-06 DIAGNOSIS — G40801 Other epilepsy, not intractable, with status epilepticus: Secondary | ICD-10-CM | POA: Diagnosis not present

## 2022-10-06 DIAGNOSIS — M6281 Muscle weakness (generalized): Secondary | ICD-10-CM | POA: Diagnosis not present

## 2022-10-06 DIAGNOSIS — E0789 Other specified disorders of thyroid: Secondary | ICD-10-CM | POA: Diagnosis not present

## 2022-10-06 DIAGNOSIS — R278 Other lack of coordination: Secondary | ICD-10-CM | POA: Diagnosis not present

## 2022-10-08 DIAGNOSIS — G40801 Other epilepsy, not intractable, with status epilepticus: Secondary | ICD-10-CM | POA: Diagnosis not present

## 2022-10-08 DIAGNOSIS — E0789 Other specified disorders of thyroid: Secondary | ICD-10-CM | POA: Diagnosis not present

## 2022-10-08 DIAGNOSIS — M6281 Muscle weakness (generalized): Secondary | ICD-10-CM | POA: Diagnosis not present

## 2022-10-08 DIAGNOSIS — R278 Other lack of coordination: Secondary | ICD-10-CM | POA: Diagnosis not present

## 2022-10-11 DIAGNOSIS — F0150 Vascular dementia without behavioral disturbance: Secondary | ICD-10-CM | POA: Diagnosis not present

## 2022-10-11 DIAGNOSIS — M6281 Muscle weakness (generalized): Secondary | ICD-10-CM | POA: Diagnosis not present

## 2022-10-11 DIAGNOSIS — I10 Essential (primary) hypertension: Secondary | ICD-10-CM | POA: Diagnosis not present

## 2022-10-11 DIAGNOSIS — R278 Other lack of coordination: Secondary | ICD-10-CM | POA: Diagnosis not present

## 2022-10-11 DIAGNOSIS — I129 Hypertensive chronic kidney disease with stage 1 through stage 4 chronic kidney disease, or unspecified chronic kidney disease: Secondary | ICD-10-CM | POA: Diagnosis not present

## 2022-10-13 DIAGNOSIS — F5105 Insomnia due to other mental disorder: Secondary | ICD-10-CM | POA: Diagnosis not present

## 2022-10-13 DIAGNOSIS — M6281 Muscle weakness (generalized): Secondary | ICD-10-CM | POA: Diagnosis not present

## 2022-10-13 DIAGNOSIS — C44319 Basal cell carcinoma of skin of other parts of face: Secondary | ICD-10-CM | POA: Diagnosis not present

## 2022-10-13 DIAGNOSIS — R278 Other lack of coordination: Secondary | ICD-10-CM | POA: Diagnosis not present

## 2022-10-13 DIAGNOSIS — F329 Major depressive disorder, single episode, unspecified: Secondary | ICD-10-CM | POA: Diagnosis not present

## 2022-10-13 DIAGNOSIS — F419 Anxiety disorder, unspecified: Secondary | ICD-10-CM | POA: Diagnosis not present

## 2022-10-13 DIAGNOSIS — L570 Actinic keratosis: Secondary | ICD-10-CM | POA: Diagnosis not present

## 2022-10-13 DIAGNOSIS — F03918 Unspecified dementia, unspecified severity, with other behavioral disturbance: Secondary | ICD-10-CM | POA: Diagnosis not present

## 2022-10-13 DIAGNOSIS — X32XXXA Exposure to sunlight, initial encounter: Secondary | ICD-10-CM | POA: Diagnosis not present

## 2022-10-13 DIAGNOSIS — I129 Hypertensive chronic kidney disease with stage 1 through stage 4 chronic kidney disease, or unspecified chronic kidney disease: Secondary | ICD-10-CM | POA: Diagnosis not present

## 2022-10-13 DIAGNOSIS — L821 Other seborrheic keratosis: Secondary | ICD-10-CM | POA: Diagnosis not present

## 2022-10-13 DIAGNOSIS — I10 Essential (primary) hypertension: Secondary | ICD-10-CM | POA: Diagnosis not present

## 2022-10-13 DIAGNOSIS — F0150 Vascular dementia without behavioral disturbance: Secondary | ICD-10-CM | POA: Diagnosis not present

## 2022-10-17 DIAGNOSIS — I10 Essential (primary) hypertension: Secondary | ICD-10-CM | POA: Diagnosis not present

## 2022-10-18 DIAGNOSIS — E785 Hyperlipidemia, unspecified: Secondary | ICD-10-CM | POA: Diagnosis not present

## 2022-10-18 DIAGNOSIS — E538 Deficiency of other specified B group vitamins: Secondary | ICD-10-CM | POA: Diagnosis not present

## 2022-10-18 DIAGNOSIS — E038 Other specified hypothyroidism: Secondary | ICD-10-CM | POA: Diagnosis not present

## 2022-10-18 DIAGNOSIS — R278 Other lack of coordination: Secondary | ICD-10-CM | POA: Diagnosis not present

## 2022-10-18 DIAGNOSIS — F0390 Unspecified dementia without behavioral disturbance: Secondary | ICD-10-CM | POA: Diagnosis not present

## 2022-10-18 DIAGNOSIS — E119 Type 2 diabetes mellitus without complications: Secondary | ICD-10-CM | POA: Diagnosis not present

## 2022-10-18 DIAGNOSIS — G4700 Insomnia, unspecified: Secondary | ICD-10-CM | POA: Diagnosis not present

## 2022-10-18 DIAGNOSIS — D518 Other vitamin B12 deficiency anemias: Secondary | ICD-10-CM | POA: Diagnosis not present

## 2022-10-18 DIAGNOSIS — E039 Hypothyroidism, unspecified: Secondary | ICD-10-CM | POA: Diagnosis not present

## 2022-10-18 DIAGNOSIS — E559 Vitamin D deficiency, unspecified: Secondary | ICD-10-CM | POA: Diagnosis not present

## 2022-10-18 DIAGNOSIS — I10 Essential (primary) hypertension: Secondary | ICD-10-CM | POA: Diagnosis not present

## 2022-10-18 DIAGNOSIS — F32A Depression, unspecified: Secondary | ICD-10-CM | POA: Diagnosis not present

## 2022-10-18 DIAGNOSIS — M6281 Muscle weakness (generalized): Secondary | ICD-10-CM | POA: Diagnosis not present

## 2022-10-18 DIAGNOSIS — I129 Hypertensive chronic kidney disease with stage 1 through stage 4 chronic kidney disease, or unspecified chronic kidney disease: Secondary | ICD-10-CM | POA: Diagnosis not present

## 2022-10-18 DIAGNOSIS — F01B4 Vascular dementia, moderate, with anxiety: Secondary | ICD-10-CM | POA: Diagnosis not present

## 2022-10-18 DIAGNOSIS — B1710 Acute hepatitis C without hepatic coma: Secondary | ICD-10-CM | POA: Diagnosis not present

## 2022-10-18 DIAGNOSIS — E782 Mixed hyperlipidemia: Secondary | ICD-10-CM | POA: Diagnosis not present

## 2022-10-20 DIAGNOSIS — I129 Hypertensive chronic kidney disease with stage 1 through stage 4 chronic kidney disease, or unspecified chronic kidney disease: Secondary | ICD-10-CM | POA: Diagnosis not present

## 2022-10-20 DIAGNOSIS — R278 Other lack of coordination: Secondary | ICD-10-CM | POA: Diagnosis not present

## 2022-10-20 DIAGNOSIS — I10 Essential (primary) hypertension: Secondary | ICD-10-CM | POA: Diagnosis not present

## 2022-10-20 DIAGNOSIS — F01B4 Vascular dementia, moderate, with anxiety: Secondary | ICD-10-CM | POA: Diagnosis not present

## 2022-10-20 DIAGNOSIS — M6281 Muscle weakness (generalized): Secondary | ICD-10-CM | POA: Diagnosis not present

## 2022-10-25 DIAGNOSIS — F01B4 Vascular dementia, moderate, with anxiety: Secondary | ICD-10-CM | POA: Diagnosis not present

## 2022-10-25 DIAGNOSIS — I129 Hypertensive chronic kidney disease with stage 1 through stage 4 chronic kidney disease, or unspecified chronic kidney disease: Secondary | ICD-10-CM | POA: Diagnosis not present

## 2022-10-25 DIAGNOSIS — M6281 Muscle weakness (generalized): Secondary | ICD-10-CM | POA: Diagnosis not present

## 2022-10-25 DIAGNOSIS — R278 Other lack of coordination: Secondary | ICD-10-CM | POA: Diagnosis not present

## 2022-10-25 DIAGNOSIS — I10 Essential (primary) hypertension: Secondary | ICD-10-CM | POA: Diagnosis not present

## 2022-10-27 DIAGNOSIS — E212 Other hyperparathyroidism: Secondary | ICD-10-CM | POA: Diagnosis not present

## 2022-10-27 DIAGNOSIS — I129 Hypertensive chronic kidney disease with stage 1 through stage 4 chronic kidney disease, or unspecified chronic kidney disease: Secondary | ICD-10-CM | POA: Diagnosis not present

## 2022-10-27 DIAGNOSIS — R278 Other lack of coordination: Secondary | ICD-10-CM | POA: Diagnosis not present

## 2022-10-27 DIAGNOSIS — M6281 Muscle weakness (generalized): Secondary | ICD-10-CM | POA: Diagnosis not present

## 2022-10-27 DIAGNOSIS — E782 Mixed hyperlipidemia: Secondary | ICD-10-CM | POA: Diagnosis not present

## 2022-10-27 DIAGNOSIS — Z79899 Other long term (current) drug therapy: Secondary | ICD-10-CM | POA: Diagnosis not present

## 2022-10-27 DIAGNOSIS — E039 Hypothyroidism, unspecified: Secondary | ICD-10-CM | POA: Diagnosis not present

## 2022-10-27 DIAGNOSIS — I10 Essential (primary) hypertension: Secondary | ICD-10-CM | POA: Diagnosis not present

## 2022-10-27 DIAGNOSIS — F01B4 Vascular dementia, moderate, with anxiety: Secondary | ICD-10-CM | POA: Diagnosis not present

## 2022-10-27 DIAGNOSIS — D518 Other vitamin B12 deficiency anemias: Secondary | ICD-10-CM | POA: Diagnosis not present

## 2022-10-27 DIAGNOSIS — E038 Other specified hypothyroidism: Secondary | ICD-10-CM | POA: Diagnosis not present

## 2022-10-27 DIAGNOSIS — E119 Type 2 diabetes mellitus without complications: Secondary | ICD-10-CM | POA: Diagnosis not present

## 2022-10-28 ENCOUNTER — Encounter (HOSPITAL_COMMUNITY): Payer: Self-pay | Admitting: Emergency Medicine

## 2022-10-28 ENCOUNTER — Emergency Department (HOSPITAL_COMMUNITY)
Admission: EM | Admit: 2022-10-28 | Discharge: 2022-10-29 | Disposition: A | Discharge status: Home / Self Care | Payer: PPO | Attending: Emergency Medicine | Admitting: Emergency Medicine

## 2022-10-28 ENCOUNTER — Other Ambulatory Visit: Payer: Self-pay

## 2022-10-28 DIAGNOSIS — G309 Alzheimer's disease, unspecified: Secondary | ICD-10-CM | POA: Diagnosis not present

## 2022-10-28 DIAGNOSIS — Z794 Long term (current) use of insulin: Secondary | ICD-10-CM | POA: Diagnosis not present

## 2022-10-28 DIAGNOSIS — E119 Type 2 diabetes mellitus without complications: Secondary | ICD-10-CM | POA: Insufficient documentation

## 2022-10-28 DIAGNOSIS — R9431 Abnormal electrocardiogram [ECG] [EKG]: Secondary | ICD-10-CM | POA: Diagnosis not present

## 2022-10-28 DIAGNOSIS — Z7982 Long term (current) use of aspirin: Secondary | ICD-10-CM | POA: Diagnosis not present

## 2022-10-28 DIAGNOSIS — D649 Anemia, unspecified: Secondary | ICD-10-CM | POA: Diagnosis not present

## 2022-10-28 DIAGNOSIS — Z79899 Other long term (current) drug therapy: Secondary | ICD-10-CM | POA: Diagnosis not present

## 2022-10-28 DIAGNOSIS — I10 Essential (primary) hypertension: Secondary | ICD-10-CM | POA: Insufficient documentation

## 2022-10-28 DIAGNOSIS — F0280 Dementia in other diseases classified elsewhere without behavioral disturbance: Secondary | ICD-10-CM | POA: Diagnosis not present

## 2022-10-28 LAB — COMPREHENSIVE METABOLIC PANEL
ALT: 16 U/L (ref 0–44)
AST: 16 U/L (ref 15–41)
Albumin: 2.8 g/dL — ABNORMAL LOW (ref 3.5–5.0)
Alkaline Phosphatase: 75 U/L (ref 38–126)
Anion gap: 9 (ref 5–15)
BUN: 37 mg/dL — ABNORMAL HIGH (ref 8–23)
CO2: 26 mmol/L (ref 22–32)
Calcium: 8.4 mg/dL — ABNORMAL LOW (ref 8.9–10.3)
Chloride: 102 mmol/L (ref 98–111)
Creatinine, Ser: 1.5 mg/dL — ABNORMAL HIGH (ref 0.44–1.00)
GFR, Estimated: 36 mL/min — ABNORMAL LOW (ref >60)
Glucose, Bld: 180 mg/dL — ABNORMAL HIGH (ref 70–99)
Potassium: 3.7 mmol/L (ref 3.5–5.1)
Sodium: 137 mmol/L (ref 135–145)
Total Bilirubin: 0.2 mg/dL — ABNORMAL LOW (ref 0.3–1.2)
Total Protein: 6.2 g/dL — ABNORMAL LOW (ref 6.5–8.1)

## 2022-10-28 LAB — CBC
HCT: 24.4 % — ABNORMAL LOW (ref 36.0–46.0)
Hemoglobin: 7.6 g/dL — ABNORMAL LOW (ref 12.0–15.0)
MCH: 27.9 pg (ref 26.0–34.0)
MCHC: 31.1 g/dL (ref 30.0–36.0)
MCV: 89.7 fL (ref 80.0–100.0)
Platelets: 248 10*3/uL (ref 150–400)
RBC: 2.72 MIL/uL — ABNORMAL LOW (ref 3.87–5.11)
RDW: 14.8 % (ref 11.5–15.5)
WBC: 10.2 10*3/uL (ref 4.0–10.5)
nRBC: 0 % (ref 0.0–0.2)

## 2022-10-28 LAB — PREPARE RBC (CROSSMATCH)

## 2022-10-28 LAB — BPAM RBC: Unit Type and Rh: 6200

## 2022-10-28 LAB — TYPE AND SCREEN

## 2022-10-28 LAB — ABO/RH: ABO/RH(D): A POS

## 2022-10-28 MED ORDER — SODIUM CHLORIDE 0.9% IV SOLUTION: Freq: Once | INTRAVENOUS | Status: AC

## 2022-10-28 NOTE — ED Provider Notes (Signed)
11:38 PM Assumed care from Dr. Rubin Payor, please see their note for full history, physical and decision making until this point. In brief this is a 77 y.o. year old female who presented to the ED tonight with No chief complaint on file.     Mild anemia. Getting blood. Reeval for dispo.   Had blood. No reaction. Still w/ HTN, fu w/ pcp for same. Stable for d/c.  Discharge instructions, including strict return precautions for new or worsening symptoms, given. Patient and/or family verbalized understanding and agreement with the plan as described.   Labs, studies and imaging reviewed by myself and considered in medical decision making if ordered. Imaging interpreted by radiology.  Labs Reviewed  CBC - Abnormal; Notable for the following components:      Result Value   RBC 2.72 (*)    Hemoglobin 7.6 (*)    HCT 24.4 (*)    All other components within normal limits  COMPREHENSIVE METABOLIC PANEL - Abnormal; Notable for the following components:   Glucose, Bld 180 (*)    BUN 37 (*)    Creatinine, Ser 1.50 (*)    Calcium 8.4 (*)    Total Protein 6.2 (*)    Albumin 2.8 (*)    Total Bilirubin 0.2 (*)    GFR, Estimated 36 (*)    All other components within normal limits  POC OCCULT BLOOD, ED  TYPE AND SCREEN  PREPARE RBC (CROSSMATCH)  ABO/RH    No orders to display    No follow-ups on file.    Achsah Mcquade, Barbara Cower, MD 10/29/22 [phone]

## 2022-10-28 NOTE — ED Provider Notes (Signed)
[place] EMERGENCY DEPARTMENT AT [facility] Provider Note   CSN: [id number] Arrival date & time: 10/28/22  1925     History  No chief complaint on file.   Courtney [name] is a 77 y.o. female.  HPI Patient sent in for anemia.  Reportedly had hemoglobin of 7.5.  No lightheadedness no dizziness no black stool.  States she does not feel fatigued.   Past Medical History:  Diagnosis Date   Alzheimer's dementia (HCC) 2015   Cancer (HCC)    Diabetes (HCC)    Hypertension     Home Medications Prior to Admission medications   Medication Sig Start Date End Date Taking? Authorizing Provider  acetaminophen (TYLENOL) 325 MG tablet Take 2 tablets (650 mg total) by mouth every 6 (six) hours as needed for mild pain, fever or headache (or Fever >/= 101). 01/24/19   Emokpae, Courage, MD  Aspirin (VAZALORE) 81 MG CAPS Take by mouth. 08/21/19   [provider]  atorvastatin (LIPITOR) 40 MG tablet Take 1 tablet (40 mg total) by mouth every morning. 10/05/20   Sharee Holster, NP  calcitRIOL (ROCALTROL) 0.25 MCG capsule Take 1 capsule (0.25 mcg total) by mouth daily with lunch. 09/22/22   Roma Kayser, MD  cholecalciferol (VITAMIN D3) 25 MCG (1000 UNIT) tablet Take 1,000 Units by mouth daily.    [provider]  cyanocobalamin 1000 MCG tablet Take by mouth.    [provider]  donepezil (ARICEPT) 10 MG tablet Take 1 tablet (10 mg total) by mouth at bedtime. 10/05/20   Sharee Holster, NP  ferrous sulfate 325 (65 FE) MG tablet Take 1 tablet (325 mg total) by mouth daily with breakfast. 10/05/20   Sharee Holster, NP  folic acid (FOLVITE) 1 MG tablet Take 1 mg by mouth 2 (two) times daily.    [provider]  furosemide (LASIX) 80 MG tablet Take 80 mg by mouth daily.    [provider]  insulin glargine (LANTUS SOLOSTAR) 100 UNIT/ML Solostar Pen Inject 40 Units into the skin at bedtime. Patient taking differently: Inject 12 Units into the skin  at bedtime. 10/05/20   Sharee Holster, NP  labetalol (NORMODYNE) 300 MG tablet Take 300 mg by mouth 2 (two) times daily.    [provider]  levothyroxine (SYNTHROID) 175 MCG tablet Take 175 mcg by mouth daily before breakfast.    [provider]  loperamide (IMODIUM A-D) 2 MG tablet Take 2 mg by mouth as needed for diarrhea or loose stools.    [provider]  memantine (NAMENDA) 5 MG tablet Take 5 mg by mouth 2 (two) times daily.    [provider]  MODERNA COVID-19 BIVAL BOOSTER 50 MCG/0.5ML injection  04/02/21   [provider]  QUEtiapine (SEROQUEL) 25 MG tablet Take 1 tablet (25 mg total) by mouth daily after supper. Patient taking differently: Take 25 mg by mouth 2 (two) times daily. 10/05/20   Sharee Holster, NP  sertraline (ZOLOFT) 100 MG tablet Take 1 tablet (100 mg total) by mouth every morning. Patient taking differently: Take 50 mg by mouth every morning. 10/05/20   Sharee Holster, NP  TRADJENTA 5 MG TABS tablet Take 5 mg by mouth daily.    [provider]  UNABLE TO FIND Diet - NAS, ConCHO    [provider]      Allergies    Patient has no known allergies.    Review of Systems  Review of Systems  Physical Exam Updated Vital Signs BP (!) 149/64 (BP Location: Left Arm)   Pulse 62   Temp 98.3 F (36.8 C) (Oral)   Resp 12   Ht 5\' 1"  (1.549 m)   Wt 95 kg   SpO2 96%   BMI 39.57 kg/m  Physical Exam Vitals and nursing note reviewed.  Cardiovascular:     Rate and Rhythm: Regular rhythm.  Abdominal:     Tenderness: There is no abdominal tenderness.  Genitourinary:    Rectum: Guaiac result negative.  Skin:    Coloration: Skin is pale.  Neurological:     Mental Status: She is alert and oriented to person, place, and time.     ED Results / Procedures / Treatments   Labs (all labs ordered are listed, but only abnormal results are displayed) Labs Reviewed  CBC - Abnormal; Notable for the following  components:      Result Value   RBC 2.72 (*)    Hemoglobin 7.6 (*)    HCT 24.4 (*)    All other components within normal limits  COMPREHENSIVE METABOLIC PANEL - Abnormal; Notable for the following components:   Glucose, Bld 180 (*)    BUN 37 (*)    Creatinine, Ser 1.50 (*)    Calcium 8.4 (*)    Total Protein 6.2 (*)    Albumin 2.8 (*)    Total Bilirubin 0.2 (*)    GFR, Estimated 36 (*)    All other components within normal limits  POC OCCULT BLOOD, ED  TYPE AND SCREEN  PREPARE RBC (CROSSMATCH)  ABO/RH    EKG None  Radiology No results found.  Procedures Procedures    Medications Ordered in ED Medications  0.9 %  sodium chloride infusion (Manually program via Guardrails IV Fluids) (has no administration in time range)    ED Course/ Medical Decision Making/ A&P                             Medical Decision Making Amount and/or Complexity of Data Reviewed Labs: ordered.  Risk Prescription drug management.  Patient sent in with anemia.  Hemoglobin of reportedly 7.5.  Patient however appears to be asymptomatic.  No lightheadedness or dizziness.  Maintain blood pressure.  On recheck here it is 7.6.  Guaiac negative.  Overall with her comorbidities I feel she would benefit for transfusion of 1 unit.  That should get her above 8.  Can follow-up as an outpatient with that.  Will discharge home after transfusion.   CRITICAL CARE Performed by: Benjiman Core Total critical care time: 30 minutes Critical care time was exclusive of separately billable procedures and treating other patients. Critical care was necessary to treat or prevent imminent or life-threatening deterioration. Critical care was time spent personally by me on the following activities: development of treatment plan with patient and/or surrogate as well as nursing, discussions with consultants, evaluation of patient's response to treatment, examination of patient, obtaining history from patient or  surrogate, ordering and performing treatments and interventions, ordering and review of laboratory studies, ordering and review of radiographic studies, pulse oximetry and re-evaluation of patient's condition.         Final Clinical Impression(s) / ED Diagnoses Final diagnoses:  Anemia, unspecified type    Rx / DC Orders ED Discharge Orders     None         Benjiman Core, MD 10/28/22 2316

## 2022-10-28 NOTE — ED Triage Notes (Signed)
Pt sent from NH after MD stated her hgb was low (7.5) and pt needed to be evaluated.

## 2022-10-29 DIAGNOSIS — Z7401 Bed confinement status: Secondary | ICD-10-CM | POA: Diagnosis not present

## 2022-10-29 DIAGNOSIS — R531 Weakness: Secondary | ICD-10-CM | POA: Diagnosis not present

## 2022-10-29 DIAGNOSIS — D649 Anemia, unspecified: Secondary | ICD-10-CM | POA: Diagnosis not present

## 2022-10-29 LAB — TYPE AND SCREEN

## 2022-10-30 LAB — BPAM RBC
Blood Product Expiration Date: 202406282359
Unit Type and Rh: 6200

## 2022-10-30 LAB — TYPE AND SCREEN
ABO/RH(D): A POS
Antibody Screen: NEGATIVE
Unit division: 0

## 2022-10-31 LAB — POC OCCULT BLOOD, ED: Fecal Occult Bld: NEGATIVE

## 2022-11-01 DIAGNOSIS — I10 Essential (primary) hypertension: Secondary | ICD-10-CM | POA: Diagnosis not present

## 2022-11-01 DIAGNOSIS — I129 Hypertensive chronic kidney disease with stage 1 through stage 4 chronic kidney disease, or unspecified chronic kidney disease: Secondary | ICD-10-CM | POA: Diagnosis not present

## 2022-11-01 DIAGNOSIS — F01B4 Vascular dementia, moderate, with anxiety: Secondary | ICD-10-CM | POA: Diagnosis not present

## 2022-11-01 DIAGNOSIS — M6281 Muscle weakness (generalized): Secondary | ICD-10-CM | POA: Diagnosis not present

## 2022-11-01 DIAGNOSIS — D508 Other iron deficiency anemias: Secondary | ICD-10-CM | POA: Diagnosis not present

## 2022-11-01 DIAGNOSIS — D649 Anemia, unspecified: Secondary | ICD-10-CM | POA: Diagnosis not present

## 2022-11-01 DIAGNOSIS — R278 Other lack of coordination: Secondary | ICD-10-CM | POA: Diagnosis not present

## 2022-11-01 DIAGNOSIS — Z9289 Personal history of other medical treatment: Secondary | ICD-10-CM | POA: Diagnosis not present

## 2022-11-03 DIAGNOSIS — F01B4 Vascular dementia, moderate, with anxiety: Secondary | ICD-10-CM | POA: Diagnosis not present

## 2022-11-03 DIAGNOSIS — R278 Other lack of coordination: Secondary | ICD-10-CM | POA: Diagnosis not present

## 2022-11-03 DIAGNOSIS — M6281 Muscle weakness (generalized): Secondary | ICD-10-CM | POA: Diagnosis not present

## 2022-11-03 DIAGNOSIS — Z79899 Other long term (current) drug therapy: Secondary | ICD-10-CM | POA: Diagnosis not present

## 2022-11-03 DIAGNOSIS — I129 Hypertensive chronic kidney disease with stage 1 through stage 4 chronic kidney disease, or unspecified chronic kidney disease: Secondary | ICD-10-CM | POA: Diagnosis not present

## 2022-11-03 DIAGNOSIS — I10 Essential (primary) hypertension: Secondary | ICD-10-CM | POA: Diagnosis not present

## 2022-11-04 LAB — PHOSPHORUS: Phosphorus: 3 mg/dL (ref 3.0–4.3)

## 2022-11-04 LAB — PTH, INTACT AND CALCIUM
Calcium: 8.7 mg/dL (ref 8.7–10.3)
PTH: 34 pg/mL (ref 15–65)

## 2022-11-04 LAB — T4, FREE: Free T4: 1.98 ng/dL — ABNORMAL HIGH (ref 0.82–1.77)

## 2022-11-04 LAB — PTH-RELATED PEPTIDE: PTH-related peptide: <2 pmol/L

## 2022-11-04 LAB — MAGNESIUM: Magnesium: 2 mg/dL (ref 1.6–2.3)

## 2022-11-04 LAB — TSH: TSH: 0.016 u[IU]/mL — ABNORMAL LOW (ref 0.450–4.500)

## 2022-11-04 LAB — VITAMIN D 25 HYDROXY (VIT D DEFICIENCY, FRACTURES): Vit D, 25-Hydroxy: 61.7 ng/mL (ref 30.0–100.0)

## 2022-11-08 DIAGNOSIS — F01B4 Vascular dementia, moderate, with anxiety: Secondary | ICD-10-CM | POA: Diagnosis not present

## 2022-11-08 DIAGNOSIS — R278 Other lack of coordination: Secondary | ICD-10-CM | POA: Diagnosis not present

## 2022-11-08 DIAGNOSIS — M6281 Muscle weakness (generalized): Secondary | ICD-10-CM | POA: Diagnosis not present

## 2022-11-08 DIAGNOSIS — I10 Essential (primary) hypertension: Secondary | ICD-10-CM | POA: Diagnosis not present

## 2022-11-08 DIAGNOSIS — I129 Hypertensive chronic kidney disease with stage 1 through stage 4 chronic kidney disease, or unspecified chronic kidney disease: Secondary | ICD-10-CM | POA: Diagnosis not present

## 2022-11-09 ENCOUNTER — Ambulatory Visit: Payer: PPO | Admitting: "Endocrinology

## 2022-11-10 DIAGNOSIS — R278 Other lack of coordination: Secondary | ICD-10-CM | POA: Diagnosis not present

## 2022-11-10 DIAGNOSIS — M6281 Muscle weakness (generalized): Secondary | ICD-10-CM | POA: Diagnosis not present

## 2022-11-10 DIAGNOSIS — I10 Essential (primary) hypertension: Secondary | ICD-10-CM | POA: Diagnosis not present

## 2022-11-10 DIAGNOSIS — I129 Hypertensive chronic kidney disease with stage 1 through stage 4 chronic kidney disease, or unspecified chronic kidney disease: Secondary | ICD-10-CM | POA: Diagnosis not present

## 2022-11-10 DIAGNOSIS — F01B4 Vascular dementia, moderate, with anxiety: Secondary | ICD-10-CM | POA: Diagnosis not present

## 2022-11-14 DIAGNOSIS — B351 Tinea unguium: Secondary | ICD-10-CM | POA: Diagnosis not present

## 2022-11-14 DIAGNOSIS — M79672 Pain in left foot: Secondary | ICD-10-CM | POA: Diagnosis not present

## 2022-11-14 DIAGNOSIS — L600 Ingrowing nail: Secondary | ICD-10-CM | POA: Diagnosis not present

## 2022-11-14 DIAGNOSIS — M79671 Pain in right foot: Secondary | ICD-10-CM | POA: Diagnosis not present

## 2022-11-15 DIAGNOSIS — M6281 Muscle weakness (generalized): Secondary | ICD-10-CM | POA: Diagnosis not present

## 2022-11-15 DIAGNOSIS — R278 Other lack of coordination: Secondary | ICD-10-CM | POA: Diagnosis not present

## 2022-11-15 DIAGNOSIS — I129 Hypertensive chronic kidney disease with stage 1 through stage 4 chronic kidney disease, or unspecified chronic kidney disease: Secondary | ICD-10-CM | POA: Diagnosis not present

## 2022-11-15 DIAGNOSIS — F01B4 Vascular dementia, moderate, with anxiety: Secondary | ICD-10-CM | POA: Diagnosis not present

## 2022-11-15 DIAGNOSIS — I10 Essential (primary) hypertension: Secondary | ICD-10-CM | POA: Diagnosis not present

## 2022-11-17 ENCOUNTER — Encounter: Payer: Self-pay | Admitting: "Endocrinology

## 2022-11-17 ENCOUNTER — Ambulatory Visit (INDEPENDENT_AMBULATORY_CARE_PROVIDER_SITE_OTHER): Payer: PPO | Admitting: "Endocrinology

## 2022-11-17 VITALS — BP 118/56 | HR 56

## 2022-11-17 DIAGNOSIS — D518 Other vitamin B12 deficiency anemias: Secondary | ICD-10-CM | POA: Diagnosis not present

## 2022-11-17 DIAGNOSIS — M6281 Muscle weakness (generalized): Secondary | ICD-10-CM | POA: Diagnosis not present

## 2022-11-17 DIAGNOSIS — F329 Major depressive disorder, single episode, unspecified: Secondary | ICD-10-CM | POA: Diagnosis not present

## 2022-11-17 DIAGNOSIS — R278 Other lack of coordination: Secondary | ICD-10-CM | POA: Diagnosis not present

## 2022-11-17 DIAGNOSIS — B1710 Acute hepatitis C without hepatic coma: Secondary | ICD-10-CM | POA: Diagnosis not present

## 2022-11-17 DIAGNOSIS — G308 Other Alzheimer's disease: Secondary | ICD-10-CM | POA: Diagnosis not present

## 2022-11-17 DIAGNOSIS — E039 Hypothyroidism, unspecified: Secondary | ICD-10-CM

## 2022-11-17 DIAGNOSIS — I10 Essential (primary) hypertension: Secondary | ICD-10-CM | POA: Diagnosis not present

## 2022-11-17 DIAGNOSIS — E782 Mixed hyperlipidemia: Secondary | ICD-10-CM | POA: Diagnosis not present

## 2022-11-17 DIAGNOSIS — F01B4 Vascular dementia, moderate, with anxiety: Secondary | ICD-10-CM | POA: Diagnosis not present

## 2022-11-17 DIAGNOSIS — E212 Other hyperparathyroidism: Secondary | ICD-10-CM | POA: Diagnosis not present

## 2022-11-17 DIAGNOSIS — F5105 Insomnia due to other mental disorder: Secondary | ICD-10-CM | POA: Diagnosis not present

## 2022-11-17 DIAGNOSIS — E559 Vitamin D deficiency, unspecified: Secondary | ICD-10-CM | POA: Diagnosis not present

## 2022-11-17 DIAGNOSIS — F0280 Dementia in other diseases classified elsewhere without behavioral disturbance: Secondary | ICD-10-CM | POA: Diagnosis not present

## 2022-11-17 DIAGNOSIS — E038 Other specified hypothyroidism: Secondary | ICD-10-CM | POA: Diagnosis not present

## 2022-11-17 DIAGNOSIS — I129 Hypertensive chronic kidney disease with stage 1 through stage 4 chronic kidney disease, or unspecified chronic kidney disease: Secondary | ICD-10-CM | POA: Diagnosis not present

## 2022-11-17 DIAGNOSIS — F419 Anxiety disorder, unspecified: Secondary | ICD-10-CM | POA: Diagnosis not present

## 2022-11-17 DIAGNOSIS — E119 Type 2 diabetes mellitus without complications: Secondary | ICD-10-CM | POA: Diagnosis not present

## 2022-11-17 MED ORDER — LEVOTHYROXINE SODIUM 150 MCG PO TABS: 150.0000 ug | ORAL_TABLET | Freq: Every day | ORAL | 1 refills | Status: DC

## 2022-11-17 NOTE — Progress Notes (Signed)
11/17/2022, 7:24 PM   Endocrinology follow-up note  Subjective:    Patient ID: Courtney [name], female    DOB: February 22, 1946, PCP Courtney Dyer, FNP   Past Medical History:  Diagnosis Date   Alzheimer's dementia (HCC) 2015   Cancer (HCC)    Diabetes (HCC)    Hypertension    Past Surgical History:  Procedure Laterality Date   ABDOMINAL HYSTERECTOMY     COLONOSCOPY N/A 05/14/2015   Procedure: COLONOSCOPY;  Surgeon: Courtney Hippo, MD;  Location: [facility];  Service: Endoscopy;  Laterality: N/A;  730   OOPHORECTOMY     Social History   Socioeconomic History   Marital status: Married    Spouse name: Not on file   Number of children: Not on file   Years of education: Not on file   Highest education level: Not on file  Occupational History   Not on file  Tobacco Use   Smoking status: Former    Packs/day: 0.50    Years: 10.00    Additional pack years: 0.00    Total pack years: 5.00    Types: Cigarettes    Quit date: 05/22/2004    Years since quitting: 18.5   Smokeless tobacco: Never  Vaping Use   Vaping Use: Never used  Substance and Sexual Activity   Alcohol use: Yes    Alcohol/week: 0.0 standard drinks of alcohol    Comment: social   Drug use: No   Sexual activity: Never  Other Topics Concern   Not on file  Social History Narrative   Not on file   Social Determinants of Health   Financial Resource Strain: Not on file  Food Insecurity: Not on file  Transportation Needs: Not on file  Physical Activity: Not on file  Stress: Not on file  Social Connections: Not on file   Family History  Problem Relation Age of Onset   Rheum arthritis Mother    Heart failure Mother    Hypertension Mother    Stroke Father    Diabetes Brother    Diabetes Sister    Hypertension Maternal Grandmother    Outpatient Encounter Medications as of 11/17/2022  Medication Sig   acetaminophen (TYLENOL) 325 MG tablet Take  2 tablets (650 mg total) by mouth every 6 (six) hours as needed for mild pain, fever or headache (or Fever >/= 101).   Aspirin (VAZALORE) 81 MG CAPS Take by mouth.   atorvastatin (LIPITOR) 40 MG tablet Take 1 tablet (40 mg total) by mouth every morning.   calcitRIOL (ROCALTROL) 0.25 MCG capsule Take 1 capsule (0.25 mcg total) by mouth daily with lunch.   cholecalciferol (VITAMIN D3) 25 MCG (1000 UNIT) tablet Take 1,000 Units by mouth daily.   cyanocobalamin 1000 MCG tablet Take by mouth.   donepezil (ARICEPT) 10 MG tablet Take 1 tablet (10 mg total) by mouth at bedtime.   ferrous sulfate 325 (65 FE) MG tablet Take 1 tablet (325 mg total) by mouth daily with breakfast.   folic acid (FOLVITE) 1 MG tablet Take 1 mg by mouth 2 (two) times daily.   furosemide (LASIX) 80 MG tablet Take 80 mg by mouth daily.   insulin glargine (LANTUS SOLOSTAR) 100  UNIT/ML Solostar Pen Inject 40 Units into the skin at bedtime. (Patient taking differently: Inject 12 Units into the skin at bedtime.)   labetalol (NORMODYNE) 300 MG tablet Take 300 mg by mouth 2 (two) times daily.   levothyroxine (SYNTHROID) 150 MCG tablet Take 1 tablet (150 mcg total) by mouth daily before breakfast.   loperamide (IMODIUM A-D) 2 MG tablet Take 2 mg by mouth as needed for diarrhea or loose stools.   memantine (NAMENDA) 5 MG tablet Take 5 mg by mouth 2 (two) times daily.   MODERNA COVID-19 BIVAL BOOSTER 50 MCG/0.5ML injection    QUEtiapine (SEROQUEL) 25 MG tablet Take 1 tablet (25 mg total) by mouth daily after supper. (Patient taking differently: Take 25 mg by mouth 2 (two) times daily.)   sertraline (ZOLOFT) 100 MG tablet Take 1 tablet (100 mg total) by mouth every morning. (Patient taking differently: Take 50 mg by mouth every morning.)   TRADJENTA 5 MG TABS tablet Take 5 mg by mouth daily.   UNABLE TO FIND Diet - NAS, ConCHO   [DISCONTINUED] levothyroxine (SYNTHROID) 175 MCG tablet Take 175 mcg by mouth daily before breakfast.   No  facility-administered encounter medications on file as of 11/17/2022.   ALLERGIES: No Known Allergies  VACCINATION STATUS: Immunization History  Administered Date(s) Administered   Moderna SARS-COV2 Booster Vaccination 04/12/2020   Moderna Sars-Covid-2 Vaccination 07/10/2019, 08/07/2019   Pneumococcal Polysaccharide-23 08/21/2019    HPI Courtney [name] is 77 y.o. female who presents today with a medical history as above. she is being seen in follow-up after she was seen consultation for elevated PTH requested by Courtney Dyer, FNP. Patient is a nursing home resident with significant cognitive deficit.  She is accompanied by her husband.  She is wheelchair-bound.  History is obtained from the couple, however mostly from chart review.  She does have stage IV CKD on nephrology follow-up.  She was found to have elevated PTH several weeks ago without hypercalcemia. Her repeat labs confirmed  mild hypocalcemia of 8.4, PTH of 34 and undetectable PTH related peptide. No documented bone density in the system.  She is not on weightbearing ambulation, on wheelchair due to disequilibrium. She is on Rocaltrol and vitamin D. There is no history of nephrolithiasis.  She has hypothyroidism for which she is on levothyroxine 175 mcg p.o. daily before breakfast.  Her previsit thyroid function tests are consistent with slight over-replacement.    She does not recall neck surgery or upper thyroid ablation. She is on polypharmacy related to her history of hypertension, dementia, vitamin deficiencies, hyperlipidemia, mood disorders.  She also has type 2 diabetes for which she is on Tradjenta 5 mg p.o. daily at breakfast.  Her most recent A1c was 7.6%.    Review of Systems  Constitutional: + Mildly fluctuating body weight, currently weighs 209 pounds.  She reports ongoing fatigue,  no subjective hyperthermia, no subjective hypothermia Eyes: no blurry vision, no xerophthalmia  Objective:       11/17/2022    11:06 AM 10/29/2022    2:46 AM 10/29/2022    2:45 AM  Vitals with BMI  Systolic 118 147 413  Diastolic 56 93 93  Pulse 56 70 70    BP (!) 118/56   Pulse (!) 56   Wt Readings from Last 3 Encounters:  10/28/22 209 lb 7 oz (95 kg)  06/21/21 209 lb (94.8 kg)  09/30/20 224 lb (101.6 kg)    Physical Exam  Constitutional:  There is no  height or weight on file to calculate BMI.,  not in acute distress, normal state of mind Eyes: PERRLA, EOMI, no exophthalmos ENT: moist mucous membranes, no gross thyromegaly, no gross cervical lymphadenopathy Cardiovascular: normal precordial activity, Regular Rate and Rhythm, no Murmur/Rubs/Gallops   CMP ( most recent) CMP     Component Value Date/Time   NA 137 10/28/2022 2002   K 3.7 10/28/2022 2002   CL 102 10/28/2022 2002   CO2 26 10/28/2022 2002   GLUCOSE 180 (H) 10/28/2022 2002   BUN 37 (H) 10/28/2022 2002   CREATININE 1.50 (H) 10/28/2022 2002   CALCIUM 8.4 (L) 10/28/2022 2002   PROT 6.2 (L) 10/28/2022 2002   ALBUMIN 2.8 (L) 10/28/2022 2002   AST 16 10/28/2022 2002   ALT 16 10/28/2022 2002   ALKPHOS 75 10/28/2022 2002   BILITOT 0.2 (L) 10/28/2022 2002   GFRNONAA 36 (L) 10/28/2022 2002   GFRAA 32 (L) 08/20/2019 1453     Diabetic Labs (most recent): Lab Results  Component Value Date   HGBA1C 7.6 (H) 09/27/2020   HGBA1C 7.9 (H) 08/20/2019     Lipid Panel ( most recent) Lipid Panel     Component Value Date/Time   CHOL 220 (H) 08/21/2019 0515   TRIG 328 (H) 08/21/2019 0515   HDL 44 08/21/2019 0515   CHOLHDL 5.0 08/21/2019 0515   VLDL 66 (H) 08/21/2019 0515   LDLCALC 110 (H) 08/21/2019 0515      Lab Results  Component Value Date   TSH 0.016 (L) 10/27/2022   TSH 1.189 09/27/2020   TSH 0.436 08/20/2019   TSH 154.535 (H) 01/22/2019   TSH 6.240 (H) 04/24/2014   FREET4 1.98 (H) 10/27/2022      Assessment & Plan:   1. Other hyperparathyroidism (HCC) 2. Hypothyroidism, unspecified type  - I have reviewed her available   records and clinically evaluated the patient. - Based on these reviews, she has elevated PTH in the background of CKD stage 4. She does not have primary hyperparathyroidism.  History not suggestive for primary hyperparathyroidism.  She does not have elevated PTH RP making malignancy related hypercalcemia unlikely. She is on appropriate 25-hydroxy vitamin D and 1, 25 dihydroxy vitamin D.  She is advised to continue. Secondary hyperparathyroidism management per nephrology.   She may benefit from bone density, not practical to do 24-hour urine studies. She will have repeat labs in 6 months. For her hypothyroidism, her previsit labs are consistent with over replacement.  I discussed and lowered her levothyroxine to 150 mcg p.o. daily before breakfast.     - she is advised to maintain close follow up with Courtney Dyer, FNP for primary care needs.   I spent  21  minutes in the care of the patient today including review of labs from Thyroid Function, CMP, and other relevant labs ; imaging/biopsy records (current and previous including abstractions from other facilities); face-to-face time discussing  her lab results and symptoms, medications doses, her options of short and long term treatment based on the latest standards of care / guidelines;   and documenting the encounter.  Courtney [name]  participated in the discussions, expressed understanding, and voiced agreement with the above plans.  All questions were answered to her satisfaction. she is encouraged to contact clinic should she have any questions or concerns prior to her return visit.   Follow up plan: Return in about 6 months (around 05/19/2023) for F/U with Pre-visit Labs.   Marquis Lunch, MD [facility] [address] [place], [place] [address] Phone: [phone]  Fax: [phone]     11/17/2022, 7:24 PM  This note was partially dictated with voice recognition software.  Similar sounding words can be transcribed inadequately or may not  be corrected upon review.

## 2022-11-18 DIAGNOSIS — E1165 Type 2 diabetes mellitus with hyperglycemia: Secondary | ICD-10-CM | POA: Diagnosis not present

## 2022-11-22 DIAGNOSIS — E213 Hyperparathyroidism, unspecified: Secondary | ICD-10-CM | POA: Diagnosis not present

## 2022-11-22 DIAGNOSIS — E0590 Thyrotoxicosis, unspecified without thyrotoxic crisis or storm: Secondary | ICD-10-CM | POA: Diagnosis not present

## 2022-11-22 DIAGNOSIS — F01B4 Vascular dementia, moderate, with anxiety: Secondary | ICD-10-CM | POA: Diagnosis not present

## 2022-11-22 DIAGNOSIS — R278 Other lack of coordination: Secondary | ICD-10-CM | POA: Diagnosis not present

## 2022-11-22 DIAGNOSIS — I10 Essential (primary) hypertension: Secondary | ICD-10-CM | POA: Diagnosis not present

## 2022-11-22 DIAGNOSIS — M6281 Muscle weakness (generalized): Secondary | ICD-10-CM | POA: Diagnosis not present

## 2022-11-22 DIAGNOSIS — Z79899 Other long term (current) drug therapy: Secondary | ICD-10-CM | POA: Diagnosis not present

## 2022-11-22 DIAGNOSIS — I129 Hypertensive chronic kidney disease with stage 1 through stage 4 chronic kidney disease, or unspecified chronic kidney disease: Secondary | ICD-10-CM | POA: Diagnosis not present

## 2022-11-24 DIAGNOSIS — F01B4 Vascular dementia, moderate, with anxiety: Secondary | ICD-10-CM | POA: Diagnosis not present

## 2022-11-24 DIAGNOSIS — M6281 Muscle weakness (generalized): Secondary | ICD-10-CM | POA: Diagnosis not present

## 2022-11-24 DIAGNOSIS — I10 Essential (primary) hypertension: Secondary | ICD-10-CM | POA: Diagnosis not present

## 2022-11-24 DIAGNOSIS — R278 Other lack of coordination: Secondary | ICD-10-CM | POA: Diagnosis not present

## 2022-11-24 DIAGNOSIS — I129 Hypertensive chronic kidney disease with stage 1 through stage 4 chronic kidney disease, or unspecified chronic kidney disease: Secondary | ICD-10-CM | POA: Diagnosis not present

## 2022-11-29 DIAGNOSIS — I129 Hypertensive chronic kidney disease with stage 1 through stage 4 chronic kidney disease, or unspecified chronic kidney disease: Secondary | ICD-10-CM | POA: Diagnosis not present

## 2022-11-29 DIAGNOSIS — F01B4 Vascular dementia, moderate, with anxiety: Secondary | ICD-10-CM | POA: Diagnosis not present

## 2022-11-29 DIAGNOSIS — R278 Other lack of coordination: Secondary | ICD-10-CM | POA: Diagnosis not present

## 2022-11-29 DIAGNOSIS — M6281 Muscle weakness (generalized): Secondary | ICD-10-CM | POA: Diagnosis not present

## 2022-11-29 DIAGNOSIS — I10 Essential (primary) hypertension: Secondary | ICD-10-CM | POA: Diagnosis not present

## 2022-12-01 DIAGNOSIS — I129 Hypertensive chronic kidney disease with stage 1 through stage 4 chronic kidney disease, or unspecified chronic kidney disease: Secondary | ICD-10-CM | POA: Diagnosis not present

## 2022-12-01 DIAGNOSIS — F01B4 Vascular dementia, moderate, with anxiety: Secondary | ICD-10-CM | POA: Diagnosis not present

## 2022-12-01 DIAGNOSIS — R278 Other lack of coordination: Secondary | ICD-10-CM | POA: Diagnosis not present

## 2022-12-01 DIAGNOSIS — M6281 Muscle weakness (generalized): Secondary | ICD-10-CM | POA: Diagnosis not present

## 2022-12-01 DIAGNOSIS — I10 Essential (primary) hypertension: Secondary | ICD-10-CM | POA: Diagnosis not present

## 2022-12-06 DIAGNOSIS — I10 Essential (primary) hypertension: Secondary | ICD-10-CM | POA: Diagnosis not present

## 2022-12-06 DIAGNOSIS — E119 Type 2 diabetes mellitus without complications: Secondary | ICD-10-CM | POA: Diagnosis not present

## 2022-12-06 DIAGNOSIS — E785 Hyperlipidemia, unspecified: Secondary | ICD-10-CM | POA: Diagnosis not present

## 2022-12-06 DIAGNOSIS — I129 Hypertensive chronic kidney disease with stage 1 through stage 4 chronic kidney disease, or unspecified chronic kidney disease: Secondary | ICD-10-CM | POA: Diagnosis not present

## 2022-12-06 DIAGNOSIS — F0390 Unspecified dementia without behavioral disturbance: Secondary | ICD-10-CM | POA: Diagnosis not present

## 2022-12-06 DIAGNOSIS — E0590 Thyrotoxicosis, unspecified without thyrotoxic crisis or storm: Secondary | ICD-10-CM | POA: Diagnosis not present

## 2022-12-06 DIAGNOSIS — N1832 Chronic kidney disease, stage 3b: Secondary | ICD-10-CM | POA: Diagnosis not present

## 2022-12-06 DIAGNOSIS — E039 Hypothyroidism, unspecified: Secondary | ICD-10-CM | POA: Diagnosis not present

## 2022-12-06 DIAGNOSIS — D508 Other iron deficiency anemias: Secondary | ICD-10-CM | POA: Diagnosis not present

## 2022-12-06 DIAGNOSIS — G4700 Insomnia, unspecified: Secondary | ICD-10-CM | POA: Diagnosis not present

## 2022-12-06 DIAGNOSIS — E559 Vitamin D deficiency, unspecified: Secondary | ICD-10-CM | POA: Diagnosis not present

## 2022-12-06 DIAGNOSIS — F01B4 Vascular dementia, moderate, with anxiety: Secondary | ICD-10-CM | POA: Diagnosis not present

## 2022-12-06 DIAGNOSIS — R278 Other lack of coordination: Secondary | ICD-10-CM | POA: Diagnosis not present

## 2022-12-06 DIAGNOSIS — E213 Hyperparathyroidism, unspecified: Secondary | ICD-10-CM | POA: Diagnosis not present

## 2022-12-06 DIAGNOSIS — M6281 Muscle weakness (generalized): Secondary | ICD-10-CM | POA: Diagnosis not present

## 2022-12-08 DIAGNOSIS — I129 Hypertensive chronic kidney disease with stage 1 through stage 4 chronic kidney disease, or unspecified chronic kidney disease: Secondary | ICD-10-CM | POA: Diagnosis not present

## 2022-12-08 DIAGNOSIS — M6281 Muscle weakness (generalized): Secondary | ICD-10-CM | POA: Diagnosis not present

## 2022-12-08 DIAGNOSIS — R278 Other lack of coordination: Secondary | ICD-10-CM | POA: Diagnosis not present

## 2022-12-08 DIAGNOSIS — F01B4 Vascular dementia, moderate, with anxiety: Secondary | ICD-10-CM | POA: Diagnosis not present

## 2022-12-08 DIAGNOSIS — I10 Essential (primary) hypertension: Secondary | ICD-10-CM | POA: Diagnosis not present

## 2022-12-13 DIAGNOSIS — F01B4 Vascular dementia, moderate, with anxiety: Secondary | ICD-10-CM | POA: Diagnosis not present

## 2022-12-13 DIAGNOSIS — I10 Essential (primary) hypertension: Secondary | ICD-10-CM | POA: Diagnosis not present

## 2022-12-13 DIAGNOSIS — M6281 Muscle weakness (generalized): Secondary | ICD-10-CM | POA: Diagnosis not present

## 2022-12-13 DIAGNOSIS — R278 Other lack of coordination: Secondary | ICD-10-CM | POA: Diagnosis not present

## 2022-12-13 DIAGNOSIS — I129 Hypertensive chronic kidney disease with stage 1 through stage 4 chronic kidney disease, or unspecified chronic kidney disease: Secondary | ICD-10-CM | POA: Diagnosis not present

## 2022-12-15 DIAGNOSIS — F01B4 Vascular dementia, moderate, with anxiety: Secondary | ICD-10-CM | POA: Diagnosis not present

## 2022-12-15 DIAGNOSIS — I10 Essential (primary) hypertension: Secondary | ICD-10-CM | POA: Diagnosis not present

## 2022-12-15 DIAGNOSIS — G308 Other Alzheimer's disease: Secondary | ICD-10-CM | POA: Diagnosis not present

## 2022-12-15 DIAGNOSIS — M6281 Muscle weakness (generalized): Secondary | ICD-10-CM | POA: Diagnosis not present

## 2022-12-15 DIAGNOSIS — R278 Other lack of coordination: Secondary | ICD-10-CM | POA: Diagnosis not present

## 2022-12-15 DIAGNOSIS — F329 Major depressive disorder, single episode, unspecified: Secondary | ICD-10-CM | POA: Diagnosis not present

## 2022-12-15 DIAGNOSIS — F0280 Dementia in other diseases classified elsewhere without behavioral disturbance: Secondary | ICD-10-CM | POA: Diagnosis not present

## 2022-12-15 DIAGNOSIS — I129 Hypertensive chronic kidney disease with stage 1 through stage 4 chronic kidney disease, or unspecified chronic kidney disease: Secondary | ICD-10-CM | POA: Diagnosis not present

## 2022-12-15 DIAGNOSIS — F5105 Insomnia due to other mental disorder: Secondary | ICD-10-CM | POA: Diagnosis not present

## 2022-12-15 DIAGNOSIS — F419 Anxiety disorder, unspecified: Secondary | ICD-10-CM | POA: Diagnosis not present

## 2022-12-16 DIAGNOSIS — E559 Vitamin D deficiency, unspecified: Secondary | ICD-10-CM | POA: Diagnosis not present

## 2022-12-16 DIAGNOSIS — I10 Essential (primary) hypertension: Secondary | ICD-10-CM | POA: Diagnosis not present

## 2022-12-16 DIAGNOSIS — E038 Other specified hypothyroidism: Secondary | ICD-10-CM | POA: Diagnosis not present

## 2022-12-16 DIAGNOSIS — E119 Type 2 diabetes mellitus without complications: Secondary | ICD-10-CM | POA: Diagnosis not present

## 2022-12-16 DIAGNOSIS — D518 Other vitamin B12 deficiency anemias: Secondary | ICD-10-CM | POA: Diagnosis not present

## 2022-12-16 DIAGNOSIS — B1710 Acute hepatitis C without hepatic coma: Secondary | ICD-10-CM | POA: Diagnosis not present

## 2022-12-16 DIAGNOSIS — E782 Mixed hyperlipidemia: Secondary | ICD-10-CM | POA: Diagnosis not present

## 2022-12-18 DIAGNOSIS — E1165 Type 2 diabetes mellitus with hyperglycemia: Secondary | ICD-10-CM | POA: Diagnosis not present

## 2022-12-20 DIAGNOSIS — M6281 Muscle weakness (generalized): Secondary | ICD-10-CM | POA: Diagnosis not present

## 2022-12-20 DIAGNOSIS — I10 Essential (primary) hypertension: Secondary | ICD-10-CM | POA: Diagnosis not present

## 2022-12-20 DIAGNOSIS — I129 Hypertensive chronic kidney disease with stage 1 through stage 4 chronic kidney disease, or unspecified chronic kidney disease: Secondary | ICD-10-CM | POA: Diagnosis not present

## 2022-12-20 DIAGNOSIS — F01B4 Vascular dementia, moderate, with anxiety: Secondary | ICD-10-CM | POA: Diagnosis not present

## 2022-12-20 DIAGNOSIS — R4182 Altered mental status, unspecified: Secondary | ICD-10-CM | POA: Diagnosis not present

## 2022-12-20 DIAGNOSIS — R278 Other lack of coordination: Secondary | ICD-10-CM | POA: Diagnosis not present

## 2022-12-22 DIAGNOSIS — M6281 Muscle weakness (generalized): Secondary | ICD-10-CM | POA: Diagnosis not present

## 2022-12-22 DIAGNOSIS — R278 Other lack of coordination: Secondary | ICD-10-CM | POA: Diagnosis not present

## 2022-12-22 DIAGNOSIS — I10 Essential (primary) hypertension: Secondary | ICD-10-CM | POA: Diagnosis not present

## 2022-12-22 DIAGNOSIS — I129 Hypertensive chronic kidney disease with stage 1 through stage 4 chronic kidney disease, or unspecified chronic kidney disease: Secondary | ICD-10-CM | POA: Diagnosis not present

## 2022-12-22 DIAGNOSIS — F01B4 Vascular dementia, moderate, with anxiety: Secondary | ICD-10-CM | POA: Diagnosis not present

## 2023-01-03 DIAGNOSIS — R278 Other lack of coordination: Secondary | ICD-10-CM | POA: Diagnosis not present

## 2023-01-03 DIAGNOSIS — N390 Urinary tract infection, site not specified: Secondary | ICD-10-CM | POA: Diagnosis not present

## 2023-01-03 DIAGNOSIS — F32A Depression, unspecified: Secondary | ICD-10-CM | POA: Diagnosis not present

## 2023-01-03 DIAGNOSIS — N1832 Chronic kidney disease, stage 3b: Secondary | ICD-10-CM | POA: Diagnosis not present

## 2023-01-03 DIAGNOSIS — D649 Anemia, unspecified: Secondary | ICD-10-CM | POA: Diagnosis not present

## 2023-01-03 DIAGNOSIS — E785 Hyperlipidemia, unspecified: Secondary | ICD-10-CM | POA: Diagnosis not present

## 2023-01-03 DIAGNOSIS — E039 Hypothyroidism, unspecified: Secondary | ICD-10-CM | POA: Diagnosis not present

## 2023-01-03 DIAGNOSIS — F01B4 Vascular dementia, moderate, with anxiety: Secondary | ICD-10-CM | POA: Diagnosis not present

## 2023-01-03 DIAGNOSIS — E559 Vitamin D deficiency, unspecified: Secondary | ICD-10-CM | POA: Diagnosis not present

## 2023-01-03 DIAGNOSIS — M6281 Muscle weakness (generalized): Secondary | ICD-10-CM | POA: Diagnosis not present

## 2023-01-03 DIAGNOSIS — E538 Deficiency of other specified B group vitamins: Secondary | ICD-10-CM | POA: Diagnosis not present

## 2023-01-03 DIAGNOSIS — E119 Type 2 diabetes mellitus without complications: Secondary | ICD-10-CM | POA: Diagnosis not present

## 2023-01-03 DIAGNOSIS — G4700 Insomnia, unspecified: Secondary | ICD-10-CM | POA: Diagnosis not present

## 2023-01-03 DIAGNOSIS — I10 Essential (primary) hypertension: Secondary | ICD-10-CM | POA: Diagnosis not present

## 2023-01-03 DIAGNOSIS — I129 Hypertensive chronic kidney disease with stage 1 through stage 4 chronic kidney disease, or unspecified chronic kidney disease: Secondary | ICD-10-CM | POA: Diagnosis not present

## 2023-01-03 DIAGNOSIS — F0390 Unspecified dementia without behavioral disturbance: Secondary | ICD-10-CM | POA: Diagnosis not present

## 2023-01-04 DIAGNOSIS — M79641 Pain in right hand: Secondary | ICD-10-CM | POA: Diagnosis not present

## 2023-01-04 DIAGNOSIS — M79642 Pain in left hand: Secondary | ICD-10-CM | POA: Diagnosis not present

## 2023-01-05 DIAGNOSIS — I129 Hypertensive chronic kidney disease with stage 1 through stage 4 chronic kidney disease, or unspecified chronic kidney disease: Secondary | ICD-10-CM | POA: Diagnosis not present

## 2023-01-05 DIAGNOSIS — R278 Other lack of coordination: Secondary | ICD-10-CM | POA: Diagnosis not present

## 2023-01-05 DIAGNOSIS — M6281 Muscle weakness (generalized): Secondary | ICD-10-CM | POA: Diagnosis not present

## 2023-01-05 DIAGNOSIS — I10 Essential (primary) hypertension: Secondary | ICD-10-CM | POA: Diagnosis not present

## 2023-01-05 DIAGNOSIS — F01B4 Vascular dementia, moderate, with anxiety: Secondary | ICD-10-CM | POA: Diagnosis not present

## 2023-01-09 DIAGNOSIS — F01B4 Vascular dementia, moderate, with anxiety: Secondary | ICD-10-CM | POA: Diagnosis not present

## 2023-01-09 DIAGNOSIS — I10 Essential (primary) hypertension: Secondary | ICD-10-CM | POA: Diagnosis not present

## 2023-01-09 DIAGNOSIS — I129 Hypertensive chronic kidney disease with stage 1 through stage 4 chronic kidney disease, or unspecified chronic kidney disease: Secondary | ICD-10-CM | POA: Diagnosis not present

## 2023-01-09 DIAGNOSIS — R278 Other lack of coordination: Secondary | ICD-10-CM | POA: Diagnosis not present

## 2023-01-09 DIAGNOSIS — M6281 Muscle weakness (generalized): Secondary | ICD-10-CM | POA: Diagnosis not present

## 2023-01-10 DIAGNOSIS — F01B4 Vascular dementia, moderate, with anxiety: Secondary | ICD-10-CM | POA: Diagnosis not present

## 2023-01-10 DIAGNOSIS — R278 Other lack of coordination: Secondary | ICD-10-CM | POA: Diagnosis not present

## 2023-01-10 DIAGNOSIS — M6281 Muscle weakness (generalized): Secondary | ICD-10-CM | POA: Diagnosis not present

## 2023-01-10 DIAGNOSIS — Z79899 Other long term (current) drug therapy: Secondary | ICD-10-CM | POA: Diagnosis not present

## 2023-01-10 DIAGNOSIS — I10 Essential (primary) hypertension: Secondary | ICD-10-CM | POA: Diagnosis not present

## 2023-01-10 DIAGNOSIS — I129 Hypertensive chronic kidney disease with stage 1 through stage 4 chronic kidney disease, or unspecified chronic kidney disease: Secondary | ICD-10-CM | POA: Diagnosis not present

## 2023-01-10 DIAGNOSIS — M1990 Unspecified osteoarthritis, unspecified site: Secondary | ICD-10-CM | POA: Diagnosis not present

## 2023-01-12 DIAGNOSIS — F329 Major depressive disorder, single episode, unspecified: Secondary | ICD-10-CM | POA: Diagnosis not present

## 2023-01-12 DIAGNOSIS — F419 Anxiety disorder, unspecified: Secondary | ICD-10-CM | POA: Diagnosis not present

## 2023-01-12 DIAGNOSIS — F5105 Insomnia due to other mental disorder: Secondary | ICD-10-CM | POA: Diagnosis not present

## 2023-01-12 DIAGNOSIS — F0280 Dementia in other diseases classified elsewhere without behavioral disturbance: Secondary | ICD-10-CM | POA: Diagnosis not present

## 2023-01-12 DIAGNOSIS — G308 Other Alzheimer's disease: Secondary | ICD-10-CM | POA: Diagnosis not present

## 2023-01-16 DIAGNOSIS — E038 Other specified hypothyroidism: Secondary | ICD-10-CM | POA: Diagnosis not present

## 2023-01-16 DIAGNOSIS — E782 Mixed hyperlipidemia: Secondary | ICD-10-CM | POA: Diagnosis not present

## 2023-01-16 DIAGNOSIS — E119 Type 2 diabetes mellitus without complications: Secondary | ICD-10-CM | POA: Diagnosis not present

## 2023-01-16 DIAGNOSIS — E559 Vitamin D deficiency, unspecified: Secondary | ICD-10-CM | POA: Diagnosis not present

## 2023-01-16 DIAGNOSIS — D518 Other vitamin B12 deficiency anemias: Secondary | ICD-10-CM | POA: Diagnosis not present

## 2023-01-16 DIAGNOSIS — B1710 Acute hepatitis C without hepatic coma: Secondary | ICD-10-CM | POA: Diagnosis not present

## 2023-01-16 DIAGNOSIS — I10 Essential (primary) hypertension: Secondary | ICD-10-CM | POA: Diagnosis not present

## 2023-01-17 DIAGNOSIS — F01B4 Vascular dementia, moderate, with anxiety: Secondary | ICD-10-CM | POA: Diagnosis not present

## 2023-01-17 DIAGNOSIS — I129 Hypertensive chronic kidney disease with stage 1 through stage 4 chronic kidney disease, or unspecified chronic kidney disease: Secondary | ICD-10-CM | POA: Diagnosis not present

## 2023-01-17 DIAGNOSIS — I10 Essential (primary) hypertension: Secondary | ICD-10-CM | POA: Diagnosis not present

## 2023-01-17 DIAGNOSIS — M6281 Muscle weakness (generalized): Secondary | ICD-10-CM | POA: Diagnosis not present

## 2023-01-17 DIAGNOSIS — R278 Other lack of coordination: Secondary | ICD-10-CM | POA: Diagnosis not present

## 2023-01-18 DIAGNOSIS — E1165 Type 2 diabetes mellitus with hyperglycemia: Secondary | ICD-10-CM | POA: Diagnosis not present

## 2023-02-07 DIAGNOSIS — I10 Essential (primary) hypertension: Secondary | ICD-10-CM | POA: Diagnosis not present

## 2023-02-07 DIAGNOSIS — G4700 Insomnia, unspecified: Secondary | ICD-10-CM | POA: Diagnosis not present

## 2023-02-07 DIAGNOSIS — E039 Hypothyroidism, unspecified: Secondary | ICD-10-CM | POA: Diagnosis not present

## 2023-02-07 DIAGNOSIS — M1990 Unspecified osteoarthritis, unspecified site: Secondary | ICD-10-CM | POA: Diagnosis not present

## 2023-02-07 DIAGNOSIS — E785 Hyperlipidemia, unspecified: Secondary | ICD-10-CM | POA: Diagnosis not present

## 2023-02-07 DIAGNOSIS — F0390 Unspecified dementia without behavioral disturbance: Secondary | ICD-10-CM | POA: Diagnosis not present

## 2023-02-07 DIAGNOSIS — E119 Type 2 diabetes mellitus without complications: Secondary | ICD-10-CM | POA: Diagnosis not present

## 2023-02-07 DIAGNOSIS — N1832 Chronic kidney disease, stage 3b: Secondary | ICD-10-CM | POA: Diagnosis not present

## 2023-02-07 DIAGNOSIS — D508 Other iron deficiency anemias: Secondary | ICD-10-CM | POA: Diagnosis not present

## 2023-02-07 DIAGNOSIS — F32A Depression, unspecified: Secondary | ICD-10-CM | POA: Diagnosis not present

## 2023-02-07 DIAGNOSIS — D649 Anemia, unspecified: Secondary | ICD-10-CM | POA: Diagnosis not present

## 2023-02-15 DIAGNOSIS — B351 Tinea unguium: Secondary | ICD-10-CM | POA: Diagnosis not present

## 2023-02-15 DIAGNOSIS — E782 Mixed hyperlipidemia: Secondary | ICD-10-CM | POA: Diagnosis not present

## 2023-02-15 DIAGNOSIS — E038 Other specified hypothyroidism: Secondary | ICD-10-CM | POA: Diagnosis not present

## 2023-02-15 DIAGNOSIS — R234 Changes in skin texture: Secondary | ICD-10-CM | POA: Diagnosis not present

## 2023-02-15 DIAGNOSIS — E1140 Type 2 diabetes mellitus with diabetic neuropathy, unspecified: Secondary | ICD-10-CM | POA: Diagnosis not present

## 2023-02-15 DIAGNOSIS — I872 Venous insufficiency (chronic) (peripheral): Secondary | ICD-10-CM | POA: Diagnosis not present

## 2023-02-15 DIAGNOSIS — I998 Other disorder of circulatory system: Secondary | ICD-10-CM | POA: Diagnosis not present

## 2023-02-15 DIAGNOSIS — L600 Ingrowing nail: Secondary | ICD-10-CM | POA: Diagnosis not present

## 2023-02-15 DIAGNOSIS — E559 Vitamin D deficiency, unspecified: Secondary | ICD-10-CM | POA: Diagnosis not present

## 2023-02-15 DIAGNOSIS — M79671 Pain in right foot: Secondary | ICD-10-CM | POA: Diagnosis not present

## 2023-02-15 DIAGNOSIS — E119 Type 2 diabetes mellitus without complications: Secondary | ICD-10-CM | POA: Diagnosis not present

## 2023-02-15 DIAGNOSIS — B1710 Acute hepatitis C without hepatic coma: Secondary | ICD-10-CM | POA: Diagnosis not present

## 2023-02-15 DIAGNOSIS — D518 Other vitamin B12 deficiency anemias: Secondary | ICD-10-CM | POA: Diagnosis not present

## 2023-02-15 DIAGNOSIS — Z79899 Other long term (current) drug therapy: Secondary | ICD-10-CM | POA: Diagnosis not present

## 2023-02-15 DIAGNOSIS — M79672 Pain in left foot: Secondary | ICD-10-CM | POA: Diagnosis not present

## 2023-02-15 DIAGNOSIS — I83893 Varicose veins of bilateral lower extremities with other complications: Secondary | ICD-10-CM | POA: Diagnosis not present

## 2023-02-15 DIAGNOSIS — I10 Essential (primary) hypertension: Secondary | ICD-10-CM | POA: Diagnosis not present

## 2023-02-16 DIAGNOSIS — F329 Major depressive disorder, single episode, unspecified: Secondary | ICD-10-CM | POA: Diagnosis not present

## 2023-02-16 DIAGNOSIS — F5105 Insomnia due to other mental disorder: Secondary | ICD-10-CM | POA: Diagnosis not present

## 2023-02-16 DIAGNOSIS — F419 Anxiety disorder, unspecified: Secondary | ICD-10-CM | POA: Diagnosis not present

## 2023-02-16 DIAGNOSIS — G308 Other Alzheimer's disease: Secondary | ICD-10-CM | POA: Diagnosis not present

## 2023-02-16 DIAGNOSIS — F0280 Dementia in other diseases classified elsewhere without behavioral disturbance: Secondary | ICD-10-CM | POA: Diagnosis not present

## 2023-02-18 DIAGNOSIS — E1165 Type 2 diabetes mellitus with hyperglycemia: Secondary | ICD-10-CM | POA: Diagnosis not present

## 2023-02-28 DIAGNOSIS — R0989 Other specified symptoms and signs involving the circulatory and respiratory systems: Secondary | ICD-10-CM | POA: Diagnosis not present

## 2023-02-28 DIAGNOSIS — I6523 Occlusion and stenosis of bilateral carotid arteries: Secondary | ICD-10-CM | POA: Diagnosis not present

## 2023-03-02 DIAGNOSIS — I77811 Abdominal aortic ectasia: Secondary | ICD-10-CM | POA: Diagnosis not present

## 2023-03-07 DIAGNOSIS — E119 Type 2 diabetes mellitus without complications: Secondary | ICD-10-CM | POA: Diagnosis not present

## 2023-03-07 DIAGNOSIS — D649 Anemia, unspecified: Secondary | ICD-10-CM | POA: Diagnosis not present

## 2023-03-07 DIAGNOSIS — N1832 Chronic kidney disease, stage 3b: Secondary | ICD-10-CM | POA: Diagnosis not present

## 2023-03-07 DIAGNOSIS — I10 Essential (primary) hypertension: Secondary | ICD-10-CM | POA: Diagnosis not present

## 2023-03-07 DIAGNOSIS — I70223 Atherosclerosis of native arteries of extremities with rest pain, bilateral legs: Secondary | ICD-10-CM | POA: Diagnosis not present

## 2023-03-07 DIAGNOSIS — F0390 Unspecified dementia without behavioral disturbance: Secondary | ICD-10-CM | POA: Diagnosis not present

## 2023-03-16 DIAGNOSIS — F419 Anxiety disorder, unspecified: Secondary | ICD-10-CM | POA: Diagnosis not present

## 2023-03-16 DIAGNOSIS — F0280 Dementia in other diseases classified elsewhere without behavioral disturbance: Secondary | ICD-10-CM | POA: Diagnosis not present

## 2023-03-16 DIAGNOSIS — G308 Other Alzheimer's disease: Secondary | ICD-10-CM | POA: Diagnosis not present

## 2023-03-16 DIAGNOSIS — F329 Major depressive disorder, single episode, unspecified: Secondary | ICD-10-CM | POA: Diagnosis not present

## 2023-03-16 DIAGNOSIS — F5105 Insomnia due to other mental disorder: Secondary | ICD-10-CM | POA: Diagnosis not present

## 2023-03-20 DIAGNOSIS — E1165 Type 2 diabetes mellitus with hyperglycemia: Secondary | ICD-10-CM | POA: Diagnosis not present

## 2023-03-22 DIAGNOSIS — E782 Mixed hyperlipidemia: Secondary | ICD-10-CM | POA: Diagnosis not present

## 2023-03-22 DIAGNOSIS — E119 Type 2 diabetes mellitus without complications: Secondary | ICD-10-CM | POA: Diagnosis not present

## 2023-03-22 DIAGNOSIS — I70223 Atherosclerosis of native arteries of extremities with rest pain, bilateral legs: Secondary | ICD-10-CM | POA: Diagnosis not present

## 2023-03-22 DIAGNOSIS — E038 Other specified hypothyroidism: Secondary | ICD-10-CM | POA: Diagnosis not present

## 2023-03-22 DIAGNOSIS — I10 Essential (primary) hypertension: Secondary | ICD-10-CM | POA: Diagnosis not present

## 2023-03-22 DIAGNOSIS — B1710 Acute hepatitis C without hepatic coma: Secondary | ICD-10-CM | POA: Diagnosis not present

## 2023-03-22 DIAGNOSIS — E559 Vitamin D deficiency, unspecified: Secondary | ICD-10-CM | POA: Diagnosis not present

## 2023-04-11 DIAGNOSIS — I10 Essential (primary) hypertension: Secondary | ICD-10-CM | POA: Diagnosis not present

## 2023-04-11 DIAGNOSIS — E119 Type 2 diabetes mellitus without complications: Secondary | ICD-10-CM | POA: Diagnosis not present

## 2023-04-11 DIAGNOSIS — E785 Hyperlipidemia, unspecified: Secondary | ICD-10-CM | POA: Diagnosis not present

## 2023-04-11 DIAGNOSIS — E039 Hypothyroidism, unspecified: Secondary | ICD-10-CM | POA: Diagnosis not present

## 2023-04-11 DIAGNOSIS — F0390 Unspecified dementia without behavioral disturbance: Secondary | ICD-10-CM | POA: Diagnosis not present

## 2023-04-11 DIAGNOSIS — M1990 Unspecified osteoarthritis, unspecified site: Secondary | ICD-10-CM | POA: Diagnosis not present

## 2023-04-11 DIAGNOSIS — N1832 Chronic kidney disease, stage 3b: Secondary | ICD-10-CM | POA: Diagnosis not present

## 2023-04-11 DIAGNOSIS — D508 Other iron deficiency anemias: Secondary | ICD-10-CM | POA: Diagnosis not present

## 2023-04-17 DIAGNOSIS — F0280 Dementia in other diseases classified elsewhere without behavioral disturbance: Secondary | ICD-10-CM | POA: Diagnosis not present

## 2023-04-17 DIAGNOSIS — G308 Other Alzheimer's disease: Secondary | ICD-10-CM | POA: Diagnosis not present

## 2023-04-17 DIAGNOSIS — F419 Anxiety disorder, unspecified: Secondary | ICD-10-CM | POA: Diagnosis not present

## 2023-04-17 DIAGNOSIS — F5105 Insomnia due to other mental disorder: Secondary | ICD-10-CM | POA: Diagnosis not present

## 2023-04-17 DIAGNOSIS — F329 Major depressive disorder, single episode, unspecified: Secondary | ICD-10-CM | POA: Diagnosis not present

## 2023-04-18 DIAGNOSIS — E559 Vitamin D deficiency, unspecified: Secondary | ICD-10-CM | POA: Diagnosis not present

## 2023-04-18 DIAGNOSIS — E119 Type 2 diabetes mellitus without complications: Secondary | ICD-10-CM | POA: Diagnosis not present

## 2023-04-18 DIAGNOSIS — I70223 Atherosclerosis of native arteries of extremities with rest pain, bilateral legs: Secondary | ICD-10-CM | POA: Diagnosis not present

## 2023-04-18 DIAGNOSIS — D518 Other vitamin B12 deficiency anemias: Secondary | ICD-10-CM | POA: Diagnosis not present

## 2023-04-18 DIAGNOSIS — E782 Mixed hyperlipidemia: Secondary | ICD-10-CM | POA: Diagnosis not present

## 2023-04-18 DIAGNOSIS — E038 Other specified hypothyroidism: Secondary | ICD-10-CM | POA: Diagnosis not present

## 2023-04-18 DIAGNOSIS — I10 Essential (primary) hypertension: Secondary | ICD-10-CM | POA: Diagnosis not present

## 2023-04-21 DIAGNOSIS — E1165 Type 2 diabetes mellitus with hyperglycemia: Secondary | ICD-10-CM | POA: Diagnosis not present

## 2023-05-03 DIAGNOSIS — E119 Type 2 diabetes mellitus without complications: Secondary | ICD-10-CM | POA: Diagnosis not present

## 2023-05-03 DIAGNOSIS — E785 Hyperlipidemia, unspecified: Secondary | ICD-10-CM | POA: Diagnosis not present

## 2023-05-03 DIAGNOSIS — I10 Essential (primary) hypertension: Secondary | ICD-10-CM | POA: Diagnosis not present

## 2023-05-03 DIAGNOSIS — N189 Chronic kidney disease, unspecified: Secondary | ICD-10-CM | POA: Diagnosis not present

## 2023-05-03 DIAGNOSIS — R279 Unspecified lack of coordination: Secondary | ICD-10-CM | POA: Diagnosis not present

## 2023-05-03 DIAGNOSIS — F0390 Unspecified dementia without behavioral disturbance: Secondary | ICD-10-CM | POA: Diagnosis not present

## 2023-05-03 DIAGNOSIS — E039 Hypothyroidism, unspecified: Secondary | ICD-10-CM | POA: Diagnosis not present

## 2023-05-03 DIAGNOSIS — M6281 Muscle weakness (generalized): Secondary | ICD-10-CM | POA: Diagnosis not present

## 2023-05-05 DIAGNOSIS — Z79899 Other long term (current) drug therapy: Secondary | ICD-10-CM | POA: Diagnosis not present

## 2023-05-05 DIAGNOSIS — B1710 Acute hepatitis C without hepatic coma: Secondary | ICD-10-CM | POA: Diagnosis not present

## 2023-05-08 DIAGNOSIS — E785 Hyperlipidemia, unspecified: Secondary | ICD-10-CM | POA: Diagnosis not present

## 2023-05-08 DIAGNOSIS — M6281 Muscle weakness (generalized): Secondary | ICD-10-CM | POA: Diagnosis not present

## 2023-05-08 DIAGNOSIS — I10 Essential (primary) hypertension: Secondary | ICD-10-CM | POA: Diagnosis not present

## 2023-05-08 DIAGNOSIS — R279 Unspecified lack of coordination: Secondary | ICD-10-CM | POA: Diagnosis not present

## 2023-05-08 DIAGNOSIS — F0390 Unspecified dementia without behavioral disturbance: Secondary | ICD-10-CM | POA: Diagnosis not present

## 2023-05-08 DIAGNOSIS — E039 Hypothyroidism, unspecified: Secondary | ICD-10-CM | POA: Diagnosis not present

## 2023-05-08 DIAGNOSIS — N189 Chronic kidney disease, unspecified: Secondary | ICD-10-CM | POA: Diagnosis not present

## 2023-05-08 DIAGNOSIS — E119 Type 2 diabetes mellitus without complications: Secondary | ICD-10-CM | POA: Diagnosis not present

## 2023-05-09 DIAGNOSIS — E559 Vitamin D deficiency, unspecified: Secondary | ICD-10-CM | POA: Diagnosis not present

## 2023-05-09 DIAGNOSIS — N1832 Chronic kidney disease, stage 3b: Secondary | ICD-10-CM | POA: Diagnosis not present

## 2023-05-09 DIAGNOSIS — D649 Anemia, unspecified: Secondary | ICD-10-CM | POA: Diagnosis not present

## 2023-05-09 DIAGNOSIS — I10 Essential (primary) hypertension: Secondary | ICD-10-CM | POA: Diagnosis not present

## 2023-05-09 DIAGNOSIS — F0390 Unspecified dementia without behavioral disturbance: Secondary | ICD-10-CM | POA: Diagnosis not present

## 2023-05-09 DIAGNOSIS — E039 Hypothyroidism, unspecified: Secondary | ICD-10-CM | POA: Diagnosis not present

## 2023-05-09 DIAGNOSIS — M1990 Unspecified osteoarthritis, unspecified site: Secondary | ICD-10-CM | POA: Diagnosis not present

## 2023-05-09 DIAGNOSIS — D508 Other iron deficiency anemias: Secondary | ICD-10-CM | POA: Diagnosis not present

## 2023-05-09 DIAGNOSIS — E538 Deficiency of other specified B group vitamins: Secondary | ICD-10-CM | POA: Diagnosis not present

## 2023-05-09 DIAGNOSIS — E785 Hyperlipidemia, unspecified: Secondary | ICD-10-CM | POA: Diagnosis not present

## 2023-05-10 DIAGNOSIS — I10 Essential (primary) hypertension: Secondary | ICD-10-CM | POA: Diagnosis not present

## 2023-05-10 DIAGNOSIS — M6281 Muscle weakness (generalized): Secondary | ICD-10-CM | POA: Diagnosis not present

## 2023-05-10 DIAGNOSIS — F0390 Unspecified dementia without behavioral disturbance: Secondary | ICD-10-CM | POA: Diagnosis not present

## 2023-05-10 DIAGNOSIS — E119 Type 2 diabetes mellitus without complications: Secondary | ICD-10-CM | POA: Diagnosis not present

## 2023-05-10 DIAGNOSIS — E785 Hyperlipidemia, unspecified: Secondary | ICD-10-CM | POA: Diagnosis not present

## 2023-05-10 DIAGNOSIS — N189 Chronic kidney disease, unspecified: Secondary | ICD-10-CM | POA: Diagnosis not present

## 2023-05-10 DIAGNOSIS — R279 Unspecified lack of coordination: Secondary | ICD-10-CM | POA: Diagnosis not present

## 2023-05-10 DIAGNOSIS — E039 Hypothyroidism, unspecified: Secondary | ICD-10-CM | POA: Diagnosis not present

## 2023-05-11 DIAGNOSIS — E782 Mixed hyperlipidemia: Secondary | ICD-10-CM | POA: Diagnosis not present

## 2023-05-11 DIAGNOSIS — E038 Other specified hypothyroidism: Secondary | ICD-10-CM | POA: Diagnosis not present

## 2023-05-11 DIAGNOSIS — Z79899 Other long term (current) drug therapy: Secondary | ICD-10-CM | POA: Diagnosis not present

## 2023-05-11 DIAGNOSIS — D519 Vitamin B12 deficiency anemia, unspecified: Secondary | ICD-10-CM | POA: Diagnosis not present

## 2023-05-11 DIAGNOSIS — E119 Type 2 diabetes mellitus without complications: Secondary | ICD-10-CM | POA: Diagnosis not present

## 2023-05-15 DIAGNOSIS — R279 Unspecified lack of coordination: Secondary | ICD-10-CM | POA: Diagnosis not present

## 2023-05-15 DIAGNOSIS — F0390 Unspecified dementia without behavioral disturbance: Secondary | ICD-10-CM | POA: Diagnosis not present

## 2023-05-15 DIAGNOSIS — E039 Hypothyroidism, unspecified: Secondary | ICD-10-CM | POA: Diagnosis not present

## 2023-05-15 DIAGNOSIS — E119 Type 2 diabetes mellitus without complications: Secondary | ICD-10-CM | POA: Diagnosis not present

## 2023-05-15 DIAGNOSIS — E785 Hyperlipidemia, unspecified: Secondary | ICD-10-CM | POA: Diagnosis not present

## 2023-05-15 DIAGNOSIS — N189 Chronic kidney disease, unspecified: Secondary | ICD-10-CM | POA: Diagnosis not present

## 2023-05-15 DIAGNOSIS — M6281 Muscle weakness (generalized): Secondary | ICD-10-CM | POA: Diagnosis not present

## 2023-05-15 DIAGNOSIS — I10 Essential (primary) hypertension: Secondary | ICD-10-CM | POA: Diagnosis not present

## 2023-05-15 LAB — VITAMIN B12: Vitamin B-12: 875

## 2023-05-16 LAB — BASIC METABOLIC PANEL
BUN: 57 — AB (ref 4–21)
CO2: 19 (ref 13–22)
Chloride: 105 (ref 99–108)
Creatinine: 1.7 — AB (ref 0.5–1.1)
Potassium: 4.2 meq/L (ref 3.5–5.1)
Sodium: 143 (ref 137–147)

## 2023-05-16 LAB — HEPATIC FUNCTION PANEL
ALT: 22 U/L (ref 7–35)
AST: 21 (ref 13–35)
Alkaline Phosphatase: 75 (ref 25–125)
Bilirubin, Total: 0.2

## 2023-05-16 LAB — LIPID PANEL
Cholesterol: 165 (ref 0–200)
HDL: 83 — AB (ref 35–70)
LDL Cholesterol: 65
LDl/HDL Ratio: 2
Triglycerides: 85 (ref 40–160)

## 2023-05-16 LAB — CBC AND DIFFERENTIAL
HCT: 32 — AB (ref 36–46)
Hemoglobin: 10.1 — AB (ref 12.0–16.0)
Platelets: 177 10*3/uL (ref 150–400)
WBC: 8.8

## 2023-05-16 LAB — TSH: TSH: 16.3 — AB (ref 0.41–5.90)

## 2023-05-16 LAB — PROTEIN / CREATININE RATIO, URINE: Creatinine, Urine: 68.5

## 2023-05-16 LAB — COMPREHENSIVE METABOLIC PANEL
Albumin: 3.9 (ref 3.5–5.0)
Calcium: 8.8 (ref 8.7–10.7)
EGFR: 31

## 2023-05-16 LAB — HEMOGLOBIN A1C: Hemoglobin A1C: 7.2

## 2023-05-16 LAB — CBC: RBC: 3.19 — AB (ref 3.87–5.11)

## 2023-05-18 DIAGNOSIS — F0280 Dementia in other diseases classified elsewhere without behavioral disturbance: Secondary | ICD-10-CM | POA: Diagnosis not present

## 2023-05-18 DIAGNOSIS — F419 Anxiety disorder, unspecified: Secondary | ICD-10-CM | POA: Diagnosis not present

## 2023-05-18 DIAGNOSIS — F5105 Insomnia due to other mental disorder: Secondary | ICD-10-CM | POA: Diagnosis not present

## 2023-05-18 DIAGNOSIS — G308 Other Alzheimer's disease: Secondary | ICD-10-CM | POA: Diagnosis not present

## 2023-05-18 DIAGNOSIS — F329 Major depressive disorder, single episode, unspecified: Secondary | ICD-10-CM | POA: Diagnosis not present

## 2023-05-20 DIAGNOSIS — E782 Mixed hyperlipidemia: Secondary | ICD-10-CM | POA: Diagnosis not present

## 2023-05-20 DIAGNOSIS — D519 Vitamin B12 deficiency anemia, unspecified: Secondary | ICD-10-CM | POA: Diagnosis not present

## 2023-05-20 DIAGNOSIS — E119 Type 2 diabetes mellitus without complications: Secondary | ICD-10-CM | POA: Diagnosis not present

## 2023-05-20 DIAGNOSIS — E559 Vitamin D deficiency, unspecified: Secondary | ICD-10-CM | POA: Diagnosis not present

## 2023-05-20 DIAGNOSIS — E038 Other specified hypothyroidism: Secondary | ICD-10-CM | POA: Diagnosis not present

## 2023-05-20 DIAGNOSIS — B1710 Acute hepatitis C without hepatic coma: Secondary | ICD-10-CM | POA: Diagnosis not present

## 2023-05-20 DIAGNOSIS — I70223 Atherosclerosis of native arteries of extremities with rest pain, bilateral legs: Secondary | ICD-10-CM | POA: Diagnosis not present

## 2023-05-20 DIAGNOSIS — I10 Essential (primary) hypertension: Secondary | ICD-10-CM | POA: Diagnosis not present

## 2023-05-21 DIAGNOSIS — E1165 Type 2 diabetes mellitus with hyperglycemia: Secondary | ICD-10-CM | POA: Diagnosis not present

## 2023-05-22 DIAGNOSIS — M6281 Muscle weakness (generalized): Secondary | ICD-10-CM | POA: Diagnosis not present

## 2023-05-22 DIAGNOSIS — F0390 Unspecified dementia without behavioral disturbance: Secondary | ICD-10-CM | POA: Diagnosis not present

## 2023-05-22 DIAGNOSIS — E119 Type 2 diabetes mellitus without complications: Secondary | ICD-10-CM | POA: Diagnosis not present

## 2023-05-22 DIAGNOSIS — E039 Hypothyroidism, unspecified: Secondary | ICD-10-CM | POA: Diagnosis not present

## 2023-05-22 DIAGNOSIS — E785 Hyperlipidemia, unspecified: Secondary | ICD-10-CM | POA: Diagnosis not present

## 2023-05-22 DIAGNOSIS — I10 Essential (primary) hypertension: Secondary | ICD-10-CM | POA: Diagnosis not present

## 2023-05-22 DIAGNOSIS — N189 Chronic kidney disease, unspecified: Secondary | ICD-10-CM | POA: Diagnosis not present

## 2023-05-22 DIAGNOSIS — R279 Unspecified lack of coordination: Secondary | ICD-10-CM | POA: Diagnosis not present

## 2023-05-31 ENCOUNTER — Encounter: Payer: Self-pay | Admitting: "Endocrinology

## 2023-05-31 ENCOUNTER — Ambulatory Visit: Payer: PPO | Admitting: "Endocrinology

## 2023-05-31 DIAGNOSIS — E785 Hyperlipidemia, unspecified: Secondary | ICD-10-CM | POA: Diagnosis not present

## 2023-05-31 DIAGNOSIS — M6281 Muscle weakness (generalized): Secondary | ICD-10-CM | POA: Diagnosis not present

## 2023-05-31 DIAGNOSIS — F0390 Unspecified dementia without behavioral disturbance: Secondary | ICD-10-CM | POA: Diagnosis not present

## 2023-05-31 DIAGNOSIS — N189 Chronic kidney disease, unspecified: Secondary | ICD-10-CM | POA: Diagnosis not present

## 2023-05-31 DIAGNOSIS — E039 Hypothyroidism, unspecified: Secondary | ICD-10-CM | POA: Diagnosis not present

## 2023-05-31 DIAGNOSIS — R279 Unspecified lack of coordination: Secondary | ICD-10-CM | POA: Diagnosis not present

## 2023-05-31 DIAGNOSIS — E119 Type 2 diabetes mellitus without complications: Secondary | ICD-10-CM | POA: Diagnosis not present

## 2023-05-31 DIAGNOSIS — I10 Essential (primary) hypertension: Secondary | ICD-10-CM | POA: Diagnosis not present

## 2023-06-05 DIAGNOSIS — E785 Hyperlipidemia, unspecified: Secondary | ICD-10-CM | POA: Diagnosis not present

## 2023-06-05 DIAGNOSIS — N189 Chronic kidney disease, unspecified: Secondary | ICD-10-CM | POA: Diagnosis not present

## 2023-06-05 DIAGNOSIS — R279 Unspecified lack of coordination: Secondary | ICD-10-CM | POA: Diagnosis not present

## 2023-06-05 DIAGNOSIS — E119 Type 2 diabetes mellitus without complications: Secondary | ICD-10-CM | POA: Diagnosis not present

## 2023-06-05 DIAGNOSIS — F0390 Unspecified dementia without behavioral disturbance: Secondary | ICD-10-CM | POA: Diagnosis not present

## 2023-06-05 DIAGNOSIS — M6281 Muscle weakness (generalized): Secondary | ICD-10-CM | POA: Diagnosis not present

## 2023-06-05 DIAGNOSIS — I10 Essential (primary) hypertension: Secondary | ICD-10-CM | POA: Diagnosis not present

## 2023-06-05 DIAGNOSIS — E039 Hypothyroidism, unspecified: Secondary | ICD-10-CM | POA: Diagnosis not present

## 2023-06-06 DIAGNOSIS — I10 Essential (primary) hypertension: Secondary | ICD-10-CM | POA: Diagnosis not present

## 2023-06-06 DIAGNOSIS — M1990 Unspecified osteoarthritis, unspecified site: Secondary | ICD-10-CM | POA: Diagnosis not present

## 2023-06-06 DIAGNOSIS — E039 Hypothyroidism, unspecified: Secondary | ICD-10-CM | POA: Diagnosis not present

## 2023-06-06 DIAGNOSIS — D649 Anemia, unspecified: Secondary | ICD-10-CM | POA: Diagnosis not present

## 2023-06-06 DIAGNOSIS — G4700 Insomnia, unspecified: Secondary | ICD-10-CM | POA: Diagnosis not present

## 2023-06-06 DIAGNOSIS — F32A Depression, unspecified: Secondary | ICD-10-CM | POA: Diagnosis not present

## 2023-06-06 DIAGNOSIS — E119 Type 2 diabetes mellitus without complications: Secondary | ICD-10-CM | POA: Diagnosis not present

## 2023-06-06 DIAGNOSIS — D508 Other iron deficiency anemias: Secondary | ICD-10-CM | POA: Diagnosis not present

## 2023-06-06 DIAGNOSIS — E213 Hyperparathyroidism, unspecified: Secondary | ICD-10-CM | POA: Diagnosis not present

## 2023-06-06 DIAGNOSIS — E785 Hyperlipidemia, unspecified: Secondary | ICD-10-CM | POA: Diagnosis not present

## 2023-06-06 DIAGNOSIS — N1832 Chronic kidney disease, stage 3b: Secondary | ICD-10-CM | POA: Diagnosis not present

## 2023-06-06 DIAGNOSIS — F0390 Unspecified dementia without behavioral disturbance: Secondary | ICD-10-CM | POA: Diagnosis not present

## 2023-06-07 DIAGNOSIS — R279 Unspecified lack of coordination: Secondary | ICD-10-CM | POA: Diagnosis not present

## 2023-06-07 DIAGNOSIS — E119 Type 2 diabetes mellitus without complications: Secondary | ICD-10-CM | POA: Diagnosis not present

## 2023-06-07 DIAGNOSIS — F0390 Unspecified dementia without behavioral disturbance: Secondary | ICD-10-CM | POA: Diagnosis not present

## 2023-06-07 DIAGNOSIS — N189 Chronic kidney disease, unspecified: Secondary | ICD-10-CM | POA: Diagnosis not present

## 2023-06-07 DIAGNOSIS — E785 Hyperlipidemia, unspecified: Secondary | ICD-10-CM | POA: Diagnosis not present

## 2023-06-07 DIAGNOSIS — M6281 Muscle weakness (generalized): Secondary | ICD-10-CM | POA: Diagnosis not present

## 2023-06-07 DIAGNOSIS — I10 Essential (primary) hypertension: Secondary | ICD-10-CM | POA: Diagnosis not present

## 2023-06-07 DIAGNOSIS — E039 Hypothyroidism, unspecified: Secondary | ICD-10-CM | POA: Diagnosis not present

## 2023-06-08 DIAGNOSIS — Z79899 Other long term (current) drug therapy: Secondary | ICD-10-CM | POA: Diagnosis not present

## 2023-06-08 DIAGNOSIS — E211 Secondary hyperparathyroidism, not elsewhere classified: Secondary | ICD-10-CM | POA: Diagnosis not present

## 2023-06-12 DIAGNOSIS — E119 Type 2 diabetes mellitus without complications: Secondary | ICD-10-CM | POA: Diagnosis not present

## 2023-06-12 DIAGNOSIS — E039 Hypothyroidism, unspecified: Secondary | ICD-10-CM | POA: Diagnosis not present

## 2023-06-12 DIAGNOSIS — R279 Unspecified lack of coordination: Secondary | ICD-10-CM | POA: Diagnosis not present

## 2023-06-12 DIAGNOSIS — I10 Essential (primary) hypertension: Secondary | ICD-10-CM | POA: Diagnosis not present

## 2023-06-12 DIAGNOSIS — M6281 Muscle weakness (generalized): Secondary | ICD-10-CM | POA: Diagnosis not present

## 2023-06-12 DIAGNOSIS — N189 Chronic kidney disease, unspecified: Secondary | ICD-10-CM | POA: Diagnosis not present

## 2023-06-12 DIAGNOSIS — F0390 Unspecified dementia without behavioral disturbance: Secondary | ICD-10-CM | POA: Diagnosis not present

## 2023-06-12 DIAGNOSIS — E785 Hyperlipidemia, unspecified: Secondary | ICD-10-CM | POA: Diagnosis not present

## 2023-06-14 ENCOUNTER — Ambulatory Visit: Payer: PPO | Admitting: "Endocrinology

## 2023-06-14 DIAGNOSIS — I10 Essential (primary) hypertension: Secondary | ICD-10-CM | POA: Diagnosis not present

## 2023-06-14 DIAGNOSIS — E785 Hyperlipidemia, unspecified: Secondary | ICD-10-CM | POA: Diagnosis not present

## 2023-06-14 DIAGNOSIS — N189 Chronic kidney disease, unspecified: Secondary | ICD-10-CM | POA: Diagnosis not present

## 2023-06-14 DIAGNOSIS — E039 Hypothyroidism, unspecified: Secondary | ICD-10-CM | POA: Diagnosis not present

## 2023-06-14 DIAGNOSIS — E119 Type 2 diabetes mellitus without complications: Secondary | ICD-10-CM | POA: Diagnosis not present

## 2023-06-14 DIAGNOSIS — F0390 Unspecified dementia without behavioral disturbance: Secondary | ICD-10-CM | POA: Diagnosis not present

## 2023-06-14 DIAGNOSIS — M6281 Muscle weakness (generalized): Secondary | ICD-10-CM | POA: Diagnosis not present

## 2023-06-14 DIAGNOSIS — R279 Unspecified lack of coordination: Secondary | ICD-10-CM | POA: Diagnosis not present

## 2023-06-15 DIAGNOSIS — B351 Tinea unguium: Secondary | ICD-10-CM | POA: Diagnosis not present

## 2023-06-15 DIAGNOSIS — R234 Changes in skin texture: Secondary | ICD-10-CM | POA: Diagnosis not present

## 2023-06-15 DIAGNOSIS — L600 Ingrowing nail: Secondary | ICD-10-CM | POA: Diagnosis not present

## 2023-06-15 DIAGNOSIS — I10 Essential (primary) hypertension: Secondary | ICD-10-CM | POA: Diagnosis not present

## 2023-06-15 DIAGNOSIS — D519 Vitamin B12 deficiency anemia, unspecified: Secondary | ICD-10-CM | POA: Diagnosis not present

## 2023-06-15 DIAGNOSIS — E1140 Type 2 diabetes mellitus with diabetic neuropathy, unspecified: Secondary | ICD-10-CM | POA: Diagnosis not present

## 2023-06-15 DIAGNOSIS — L601 Onycholysis: Secondary | ICD-10-CM | POA: Diagnosis not present

## 2023-06-15 DIAGNOSIS — E119 Type 2 diabetes mellitus without complications: Secondary | ICD-10-CM | POA: Diagnosis not present

## 2023-06-15 DIAGNOSIS — E038 Other specified hypothyroidism: Secondary | ICD-10-CM | POA: Diagnosis not present

## 2023-06-15 DIAGNOSIS — I998 Other disorder of circulatory system: Secondary | ICD-10-CM | POA: Diagnosis not present

## 2023-06-15 DIAGNOSIS — M79671 Pain in right foot: Secondary | ICD-10-CM | POA: Diagnosis not present

## 2023-06-15 DIAGNOSIS — I872 Venous insufficiency (chronic) (peripheral): Secondary | ICD-10-CM | POA: Diagnosis not present

## 2023-06-15 DIAGNOSIS — M79672 Pain in left foot: Secondary | ICD-10-CM | POA: Diagnosis not present

## 2023-06-15 DIAGNOSIS — E559 Vitamin D deficiency, unspecified: Secondary | ICD-10-CM | POA: Diagnosis not present

## 2023-06-15 DIAGNOSIS — I83893 Varicose veins of bilateral lower extremities with other complications: Secondary | ICD-10-CM | POA: Diagnosis not present

## 2023-06-15 DIAGNOSIS — I70223 Atherosclerosis of native arteries of extremities with rest pain, bilateral legs: Secondary | ICD-10-CM | POA: Diagnosis not present

## 2023-06-15 DIAGNOSIS — B1710 Acute hepatitis C without hepatic coma: Secondary | ICD-10-CM | POA: Diagnosis not present

## 2023-06-15 DIAGNOSIS — E782 Mixed hyperlipidemia: Secondary | ICD-10-CM | POA: Diagnosis not present

## 2023-06-19 DIAGNOSIS — E119 Type 2 diabetes mellitus without complications: Secondary | ICD-10-CM | POA: Diagnosis not present

## 2023-06-19 DIAGNOSIS — N189 Chronic kidney disease, unspecified: Secondary | ICD-10-CM | POA: Diagnosis not present

## 2023-06-19 DIAGNOSIS — I10 Essential (primary) hypertension: Secondary | ICD-10-CM | POA: Diagnosis not present

## 2023-06-19 DIAGNOSIS — E785 Hyperlipidemia, unspecified: Secondary | ICD-10-CM | POA: Diagnosis not present

## 2023-06-19 DIAGNOSIS — R279 Unspecified lack of coordination: Secondary | ICD-10-CM | POA: Diagnosis not present

## 2023-06-19 DIAGNOSIS — E039 Hypothyroidism, unspecified: Secondary | ICD-10-CM | POA: Diagnosis not present

## 2023-06-19 DIAGNOSIS — F0390 Unspecified dementia without behavioral disturbance: Secondary | ICD-10-CM | POA: Diagnosis not present

## 2023-06-19 DIAGNOSIS — M6281 Muscle weakness (generalized): Secondary | ICD-10-CM | POA: Diagnosis not present

## 2023-06-21 DIAGNOSIS — R279 Unspecified lack of coordination: Secondary | ICD-10-CM | POA: Diagnosis not present

## 2023-06-21 DIAGNOSIS — I10 Essential (primary) hypertension: Secondary | ICD-10-CM | POA: Diagnosis not present

## 2023-06-21 DIAGNOSIS — F0390 Unspecified dementia without behavioral disturbance: Secondary | ICD-10-CM | POA: Diagnosis not present

## 2023-06-21 DIAGNOSIS — E119 Type 2 diabetes mellitus without complications: Secondary | ICD-10-CM | POA: Diagnosis not present

## 2023-06-21 DIAGNOSIS — N189 Chronic kidney disease, unspecified: Secondary | ICD-10-CM | POA: Diagnosis not present

## 2023-06-21 DIAGNOSIS — R7309 Other abnormal glucose: Secondary | ICD-10-CM | POA: Diagnosis not present

## 2023-06-21 DIAGNOSIS — E785 Hyperlipidemia, unspecified: Secondary | ICD-10-CM | POA: Diagnosis not present

## 2023-06-21 DIAGNOSIS — E039 Hypothyroidism, unspecified: Secondary | ICD-10-CM | POA: Diagnosis not present

## 2023-06-21 DIAGNOSIS — M6281 Muscle weakness (generalized): Secondary | ICD-10-CM | POA: Diagnosis not present

## 2023-06-22 DIAGNOSIS — F329 Major depressive disorder, single episode, unspecified: Secondary | ICD-10-CM | POA: Diagnosis not present

## 2023-06-22 DIAGNOSIS — F5105 Insomnia due to other mental disorder: Secondary | ICD-10-CM | POA: Diagnosis not present

## 2023-06-22 DIAGNOSIS — F0280 Dementia in other diseases classified elsewhere without behavioral disturbance: Secondary | ICD-10-CM | POA: Diagnosis not present

## 2023-06-22 DIAGNOSIS — F419 Anxiety disorder, unspecified: Secondary | ICD-10-CM | POA: Diagnosis not present

## 2023-06-22 DIAGNOSIS — G308 Other Alzheimer's disease: Secondary | ICD-10-CM | POA: Diagnosis not present

## 2023-06-28 DIAGNOSIS — I10 Essential (primary) hypertension: Secondary | ICD-10-CM | POA: Diagnosis not present

## 2023-06-28 DIAGNOSIS — E119 Type 2 diabetes mellitus without complications: Secondary | ICD-10-CM | POA: Diagnosis not present

## 2023-06-28 DIAGNOSIS — E785 Hyperlipidemia, unspecified: Secondary | ICD-10-CM | POA: Diagnosis not present

## 2023-06-28 DIAGNOSIS — R279 Unspecified lack of coordination: Secondary | ICD-10-CM | POA: Diagnosis not present

## 2023-06-28 DIAGNOSIS — M6281 Muscle weakness (generalized): Secondary | ICD-10-CM | POA: Diagnosis not present

## 2023-06-28 DIAGNOSIS — N189 Chronic kidney disease, unspecified: Secondary | ICD-10-CM | POA: Diagnosis not present

## 2023-06-28 DIAGNOSIS — E039 Hypothyroidism, unspecified: Secondary | ICD-10-CM | POA: Diagnosis not present

## 2023-06-28 DIAGNOSIS — F0390 Unspecified dementia without behavioral disturbance: Secondary | ICD-10-CM | POA: Diagnosis not present

## 2023-07-02 DIAGNOSIS — N189 Chronic kidney disease, unspecified: Secondary | ICD-10-CM | POA: Diagnosis not present

## 2023-07-02 DIAGNOSIS — F0390 Unspecified dementia without behavioral disturbance: Secondary | ICD-10-CM | POA: Diagnosis not present

## 2023-07-02 DIAGNOSIS — E039 Hypothyroidism, unspecified: Secondary | ICD-10-CM | POA: Diagnosis not present

## 2023-07-02 DIAGNOSIS — R279 Unspecified lack of coordination: Secondary | ICD-10-CM | POA: Diagnosis not present

## 2023-07-02 DIAGNOSIS — E119 Type 2 diabetes mellitus without complications: Secondary | ICD-10-CM | POA: Diagnosis not present

## 2023-07-02 DIAGNOSIS — M6281 Muscle weakness (generalized): Secondary | ICD-10-CM | POA: Diagnosis not present

## 2023-07-02 DIAGNOSIS — E785 Hyperlipidemia, unspecified: Secondary | ICD-10-CM | POA: Diagnosis not present

## 2023-07-02 DIAGNOSIS — I10 Essential (primary) hypertension: Secondary | ICD-10-CM | POA: Diagnosis not present

## 2023-07-05 DIAGNOSIS — I10 Essential (primary) hypertension: Secondary | ICD-10-CM | POA: Diagnosis not present

## 2023-07-05 DIAGNOSIS — F0390 Unspecified dementia without behavioral disturbance: Secondary | ICD-10-CM | POA: Diagnosis not present

## 2023-07-05 DIAGNOSIS — N189 Chronic kidney disease, unspecified: Secondary | ICD-10-CM | POA: Diagnosis not present

## 2023-07-05 DIAGNOSIS — E039 Hypothyroidism, unspecified: Secondary | ICD-10-CM | POA: Diagnosis not present

## 2023-07-05 DIAGNOSIS — R279 Unspecified lack of coordination: Secondary | ICD-10-CM | POA: Diagnosis not present

## 2023-07-05 DIAGNOSIS — E785 Hyperlipidemia, unspecified: Secondary | ICD-10-CM | POA: Diagnosis not present

## 2023-07-05 DIAGNOSIS — M6281 Muscle weakness (generalized): Secondary | ICD-10-CM | POA: Diagnosis not present

## 2023-07-05 DIAGNOSIS — E119 Type 2 diabetes mellitus without complications: Secondary | ICD-10-CM | POA: Diagnosis not present

## 2023-07-06 DIAGNOSIS — E213 Hyperparathyroidism, unspecified: Secondary | ICD-10-CM | POA: Diagnosis not present

## 2023-07-06 DIAGNOSIS — N1832 Chronic kidney disease, stage 3b: Secondary | ICD-10-CM | POA: Diagnosis not present

## 2023-07-06 DIAGNOSIS — E119 Type 2 diabetes mellitus without complications: Secondary | ICD-10-CM | POA: Diagnosis not present

## 2023-07-06 DIAGNOSIS — F0390 Unspecified dementia without behavioral disturbance: Secondary | ICD-10-CM | POA: Diagnosis not present

## 2023-07-06 DIAGNOSIS — I10 Essential (primary) hypertension: Secondary | ICD-10-CM | POA: Diagnosis not present

## 2023-07-06 DIAGNOSIS — E559 Vitamin D deficiency, unspecified: Secondary | ICD-10-CM | POA: Diagnosis not present

## 2023-07-06 DIAGNOSIS — E785 Hyperlipidemia, unspecified: Secondary | ICD-10-CM | POA: Diagnosis not present

## 2023-07-06 DIAGNOSIS — D508 Other iron deficiency anemias: Secondary | ICD-10-CM | POA: Diagnosis not present

## 2023-07-06 DIAGNOSIS — M1990 Unspecified osteoarthritis, unspecified site: Secondary | ICD-10-CM | POA: Diagnosis not present

## 2023-07-10 DIAGNOSIS — E039 Hypothyroidism, unspecified: Secondary | ICD-10-CM | POA: Diagnosis not present

## 2023-07-10 DIAGNOSIS — N189 Chronic kidney disease, unspecified: Secondary | ICD-10-CM | POA: Diagnosis not present

## 2023-07-10 DIAGNOSIS — R279 Unspecified lack of coordination: Secondary | ICD-10-CM | POA: Diagnosis not present

## 2023-07-10 DIAGNOSIS — F0390 Unspecified dementia without behavioral disturbance: Secondary | ICD-10-CM | POA: Diagnosis not present

## 2023-07-10 DIAGNOSIS — M6281 Muscle weakness (generalized): Secondary | ICD-10-CM | POA: Diagnosis not present

## 2023-07-10 DIAGNOSIS — E785 Hyperlipidemia, unspecified: Secondary | ICD-10-CM | POA: Diagnosis not present

## 2023-07-10 DIAGNOSIS — I10 Essential (primary) hypertension: Secondary | ICD-10-CM | POA: Diagnosis not present

## 2023-07-10 DIAGNOSIS — E119 Type 2 diabetes mellitus without complications: Secondary | ICD-10-CM | POA: Diagnosis not present

## 2023-07-14 DIAGNOSIS — F329 Major depressive disorder, single episode, unspecified: Secondary | ICD-10-CM | POA: Diagnosis not present

## 2023-07-14 DIAGNOSIS — F419 Anxiety disorder, unspecified: Secondary | ICD-10-CM | POA: Diagnosis not present

## 2023-07-14 DIAGNOSIS — F0280 Dementia in other diseases classified elsewhere without behavioral disturbance: Secondary | ICD-10-CM | POA: Diagnosis not present

## 2023-07-14 DIAGNOSIS — F5105 Insomnia due to other mental disorder: Secondary | ICD-10-CM | POA: Diagnosis not present

## 2023-07-14 DIAGNOSIS — G308 Other Alzheimer's disease: Secondary | ICD-10-CM | POA: Diagnosis not present

## 2023-07-15 ENCOUNTER — Encounter (HOSPITAL_COMMUNITY): Payer: Self-pay

## 2023-07-15 ENCOUNTER — Emergency Department (HOSPITAL_COMMUNITY): Payer: PPO

## 2023-07-15 ENCOUNTER — Other Ambulatory Visit: Payer: Self-pay

## 2023-07-15 ENCOUNTER — Emergency Department (HOSPITAL_COMMUNITY)
Admission: EM | Admit: 2023-07-15 | Discharge: 2023-07-15 | Disposition: A | Discharge status: Home / Self Care | Payer: PPO | Attending: Emergency Medicine | Admitting: Emergency Medicine

## 2023-07-15 DIAGNOSIS — F0280 Dementia in other diseases classified elsewhere without behavioral disturbance: Secondary | ICD-10-CM | POA: Diagnosis not present

## 2023-07-15 DIAGNOSIS — Z794 Long term (current) use of insulin: Secondary | ICD-10-CM | POA: Diagnosis not present

## 2023-07-15 DIAGNOSIS — R9431 Abnormal electrocardiogram [ECG] [EKG]: Secondary | ICD-10-CM | POA: Insufficient documentation

## 2023-07-15 DIAGNOSIS — R4182 Altered mental status, unspecified: Secondary | ICD-10-CM | POA: Insufficient documentation

## 2023-07-15 DIAGNOSIS — R739 Hyperglycemia, unspecified: Secondary | ICD-10-CM | POA: Insufficient documentation

## 2023-07-15 DIAGNOSIS — Z79899 Other long term (current) drug therapy: Secondary | ICD-10-CM | POA: Insufficient documentation

## 2023-07-15 DIAGNOSIS — Z7982 Long term (current) use of aspirin: Secondary | ICD-10-CM | POA: Diagnosis not present

## 2023-07-15 DIAGNOSIS — E1165 Type 2 diabetes mellitus with hyperglycemia: Secondary | ICD-10-CM | POA: Diagnosis not present

## 2023-07-15 DIAGNOSIS — E1122 Type 2 diabetes mellitus with diabetic chronic kidney disease: Secondary | ICD-10-CM | POA: Insufficient documentation

## 2023-07-15 DIAGNOSIS — I10 Essential (primary) hypertension: Secondary | ICD-10-CM | POA: Diagnosis not present

## 2023-07-15 DIAGNOSIS — I129 Hypertensive chronic kidney disease with stage 1 through stage 4 chronic kidney disease, or unspecified chronic kidney disease: Secondary | ICD-10-CM | POA: Insufficient documentation

## 2023-07-15 DIAGNOSIS — I6782 Cerebral ischemia: Secondary | ICD-10-CM | POA: Diagnosis not present

## 2023-07-15 DIAGNOSIS — R0989 Other specified symptoms and signs involving the circulatory and respiratory systems: Secondary | ICD-10-CM | POA: Diagnosis not present

## 2023-07-15 DIAGNOSIS — R404 Transient alteration of awareness: Secondary | ICD-10-CM | POA: Diagnosis not present

## 2023-07-15 DIAGNOSIS — R410 Disorientation, unspecified: Secondary | ICD-10-CM | POA: Diagnosis not present

## 2023-07-15 DIAGNOSIS — N183 Chronic kidney disease, stage 3 unspecified: Secondary | ICD-10-CM | POA: Insufficient documentation

## 2023-07-15 DIAGNOSIS — R600 Localized edema: Secondary | ICD-10-CM | POA: Insufficient documentation

## 2023-07-15 DIAGNOSIS — I700 Atherosclerosis of aorta: Secondary | ICD-10-CM | POA: Diagnosis not present

## 2023-07-15 DIAGNOSIS — E860 Dehydration: Secondary | ICD-10-CM | POA: Diagnosis not present

## 2023-07-15 LAB — PROTIME-INR
INR: 0.9 (ref 0.8–1.2)
Prothrombin Time: 12.7 s (ref 11.4–15.2)

## 2023-07-15 LAB — COMPREHENSIVE METABOLIC PANEL
ALT: 16 U/L (ref 0–44)
AST: 15 U/L (ref 15–41)
Albumin: 3.5 g/dL (ref 3.5–5.0)
Alkaline Phosphatase: 69 U/L (ref 38–126)
Anion gap: 11 (ref 5–15)
BUN: 52 mg/dL — ABNORMAL HIGH (ref 8–23)
CO2: 26 mmol/L (ref 22–32)
Calcium: 9.4 mg/dL (ref 8.9–10.3)
Chloride: 103 mmol/L (ref 98–111)
Creatinine, Ser: 1.76 mg/dL — ABNORMAL HIGH (ref 0.44–1.00)
GFR, Estimated: 29 mL/min — ABNORMAL LOW (ref >60)
Glucose, Bld: 283 mg/dL — ABNORMAL HIGH (ref 70–99)
Potassium: 4.1 mmol/L (ref 3.5–5.1)
Sodium: 140 mmol/L (ref 135–145)
Total Bilirubin: 0.5 mg/dL (ref 0.0–1.2)
Total Protein: 6.9 g/dL (ref 6.5–8.1)

## 2023-07-15 LAB — CBC WITH DIFFERENTIAL/PLATELET
Abs Immature Granulocytes: 0.04 10*3/uL (ref 0.00–0.07)
Basophils Absolute: 0.1 10*3/uL (ref 0.0–0.1)
Basophils Relative: 1 %
Eosinophils Absolute: 0.1 10*3/uL (ref 0.0–0.5)
Eosinophils Relative: 1 %
HCT: 32.8 % — ABNORMAL LOW (ref 36.0–46.0)
Hemoglobin: 11.1 g/dL — ABNORMAL LOW (ref 12.0–15.0)
Immature Granulocytes: 0 %
Lymphocytes Relative: 15 %
Lymphs Abs: 1.4 10*3/uL (ref 0.7–4.0)
MCH: 32.4 pg (ref 26.0–34.0)
MCHC: 33.8 g/dL (ref 30.0–36.0)
MCV: 95.6 fL (ref 80.0–100.0)
Monocytes Absolute: 0.7 10*3/uL (ref 0.1–1.0)
Monocytes Relative: 8 %
Neutro Abs: 6.6 10*3/uL (ref 1.7–7.7)
Neutrophils Relative %: 75 %
Platelets: 185 10*3/uL (ref 150–400)
RBC: 3.43 MIL/uL — ABNORMAL LOW (ref 3.87–5.11)
RDW: 13 % (ref 11.5–15.5)
WBC: 9 10*3/uL (ref 4.0–10.5)
nRBC: 0 % (ref 0.0–0.2)

## 2023-07-15 LAB — ETHANOL: Alcohol, Ethyl (B): <10 mg/dL (ref <10)

## 2023-07-15 LAB — TROPONIN I (HIGH SENSITIVITY)
Troponin I (High Sensitivity): 5 ng/L (ref <18)
Troponin I (High Sensitivity): 7 ng/L (ref <18)

## 2023-07-15 LAB — CBG MONITORING, ED: Glucose-Capillary: 238 mg/dL — ABNORMAL HIGH (ref 70–99)

## 2023-07-15 LAB — APTT: aPTT: 31 s (ref 24–36)

## 2023-07-15 LAB — LACTIC ACID, PLASMA
Lactic Acid, Venous: 1.5 mmol/L (ref 0.5–1.9)
Lactic Acid, Venous: 1.4 mmol/L (ref 0.5–1.9)

## 2023-07-15 MED ORDER — SODIUM CHLORIDE 0.9 % IV BOLUS
500.0000 mL | Freq: Once | INTRAVENOUS | Status: AC
  Administered 2023-07-15: 500 mL via INTRAVENOUS

## 2023-07-15 MED ORDER — CEPHALEXIN 500 MG PO CAPS
500.0000 mg | ORAL_CAPSULE | Freq: Once | ORAL | Status: AC
  Administered 2023-07-15: 500 mg via ORAL
  Filled 2023-07-15: qty 1

## 2023-07-15 MED ORDER — CEPHALEXIN 500 MG PO CAPS: 500.0000 mg | ORAL_CAPSULE | Freq: Four times a day (QID) | ORAL | 0 refills | Status: DC

## 2023-07-15 MED ORDER — INSULIN ASPART 100 UNIT/ML IJ SOLN
4.0000 [IU] | Freq: Once | INTRAMUSCULAR | Status: AC
  Administered 2023-07-15: 4 [IU] via SUBCUTANEOUS

## 2023-07-15 MED ORDER — ACETAMINOPHEN 325 MG PO TABS
650.0000 mg | ORAL_TABLET | Freq: Once | ORAL | Status: DC
  Filled 2023-07-15: qty 2

## 2023-07-15 NOTE — Consult Note (Signed)
 1702 Code stroke cart activated/ Pt had been for NCCT and back to room when cart was activated.  61 Dr Otelia Limes paged 1718 Dr Otelia Limes answered page MRS 4-5. Per bedside report, pt lives at facility and while eating today got choked and became responsive only to pain.  EMS was notified and pt brought to ED.  After CT, pt was more alert and responsive to family, but, not back to baseline.  NCCT results reported to Dr Otelia Limes when he came on screen at 1718.

## 2023-07-15 NOTE — ED Provider Notes (Signed)
 [place] EMERGENCY DEPARTMENT AT [facility] Provider Note   CSN: [id number] Arrival date & time: 07/15/23  1620     History  Chief Complaint  Patient presents with   Altered Mental Status    Courtney [name] is a 78 y.o. female.  Patient is a 78 year old female who presents emergency department with her husband and daughter secondary to decreased responsiveness as well as aphasia.  They note that earlier today she did choke on a pea while eating and then had a bout of vomiting.  On presentation the patient is unable provide her own history but will nod yes or shake her head no.  She is cooperative with the exam and does move all 4 extremities without difficulty.  Family notes that they did bring her back to her room at her assisted living facility and states that she napped for approximately 4 hours which is abnormal for her baseline.  They notes that she continued to have difficulty with speaking.  Family notes that this has happened previously when the patient's blood sugar has been elevated or when she has had a urinary tract infection.  Family notes that the patient has had no other acute complaints.   Altered Mental Status      Home Medications Prior to Admission medications   Medication Sig Start Date End Date Taking? Authorizing Provider  furosemide (LASIX) 40 MG tablet Take 40 mg by mouth 2 (two) times daily. 05/30/23  Yes [provider]  meloxicam (MOBIC) 15 MG tablet Take 15 mg by mouth daily. 05/23/23  Yes [provider]  acetaminophen (TYLENOL) 325 MG tablet Take 2 tablets (650 mg total) by mouth every 6 (six) hours as needed for mild pain, fever or headache (or Fever >/= 101). 01/24/19   Emokpae, Courage, MD  Aspirin (VAZALORE) 81 MG CAPS Take by mouth. 08/21/19   [provider]  atorvastatin (LIPITOR) 40 MG tablet Take 1 tablet (40 mg total) by mouth every morning. 10/05/20   Sharee Holster, NP  calcitRIOL (ROCALTROL) 0.25 MCG capsule  Take 1 capsule (0.25 mcg total) by mouth daily with lunch. 09/22/22   Roma Kayser, MD  cholecalciferol (VITAMIN D3) 25 MCG (1000 UNIT) tablet Take 1,000 Units by mouth daily.    [provider]  cyanocobalamin 1000 MCG tablet Take by mouth.    [provider]  donepezil (ARICEPT) 5 MG tablet Take 5 mg by mouth daily.    [provider]  ferrous sulfate 325 (65 FE) MG tablet Take 1 tablet (325 mg total) by mouth daily with breakfast. 10/05/20   Sharee Holster, NP  folic acid (FOLVITE) 1 MG tablet Take 1 mg by mouth 2 (two) times daily.    [provider]  insulin glargine (LANTUS SOLOSTAR) 100 UNIT/ML Solostar Pen Inject 40 Units into the skin at bedtime. Patient taking differently: Inject 12 Units into the skin at bedtime. 10/05/20   Sharee Holster, NP  labetalol (NORMODYNE) 300 MG tablet Take 300 mg by mouth 2 (two) times daily.    [provider]  levothyroxine (SYNTHROID) 175 MCG tablet Take 175 mcg by mouth daily.    [provider]  loperamide (IMODIUM A-D) 2 MG tablet Take 2 mg by mouth as needed for diarrhea or loose stools.    [provider]  memantine (NAMENDA) 5 MG tablet Take 5 mg by mouth 2 (two) times daily.    [provider]  MODERNA COVID-19 BIVAL BOOSTER 50  MCG/0.5ML injection  04/02/21   [provider]  QUEtiapine (SEROQUEL) 25 MG tablet Take 1 tablet (25 mg total) by mouth daily after supper. Patient taking differently: Take 25 mg by mouth 2 (two) times daily. 10/05/20   Sharee Holster, NP  sertraline (ZOLOFT) 50 MG tablet Take 50 mg by mouth daily.    [provider]  TRADJENTA 5 MG TABS tablet Take 5 mg by mouth daily.    [provider]  UNABLE TO FIND Diet - NAS, ConCHO    [provider]      Allergies    Patient has no known allergies.    Review of Systems   Review of Systems  Neurological:        Altered mental status  All other systems reviewed  and are negative.   Physical Exam Updated Vital Signs BP (!) 164/78   Pulse 62   Resp 16   SpO2 99%  Physical Exam Vitals reviewed.  Constitutional:      Appearance: Normal appearance.  HENT:     Head: Normocephalic and atraumatic.     Nose: Nose normal.     Mouth/Throat:     Mouth: Mucous membranes are moist.  Eyes:     Extraocular Movements: Extraocular movements intact.     Conjunctiva/sclera: Conjunctivae normal.     Pupils: Pupils are equal, round, and reactive to light.  Cardiovascular:     Rate and Rhythm: Normal rate and regular rhythm.     Pulses: Normal pulses.     Heart sounds: Normal heart sounds. No murmur heard.    No friction rub. No gallop.  Pulmonary:     Effort: Pulmonary effort is normal. No respiratory distress.     Breath sounds: Normal breath sounds. No wheezing, rhonchi or rales.  Abdominal:     General: Abdomen is flat. Bowel sounds are normal. There is no distension.     Palpations: Abdomen is soft. There is no mass.     Tenderness: There is no abdominal tenderness. There is no guarding.  Musculoskeletal:        General: Normal range of motion.     Cervical back: Normal range of motion and neck supple.     Right lower leg: Edema present.     Left lower leg: Edema present.  Skin:    General: Skin is warm and dry.  Neurological:     General: No focal deficit present.     Mental Status: She is alert and oriented to person, place, and time. Mental status is at baseline.     Cranial Nerves: No cranial nerve deficit.     Sensory: No sensory deficit.     Motor: No weakness.     Coordination: Coordination normal.  Psychiatric:        Mood and Affect: Mood normal.        Behavior: Behavior normal.        Thought Content: Thought content normal.        Judgment: Judgment normal.     ED Results / Procedures / Treatments   Labs (all labs ordered are listed, but only abnormal results are displayed) Labs Reviewed  COMPREHENSIVE METABOLIC PANEL -  Abnormal; Notable for the following components:      Result Value   Glucose, Bld 283 (*)    BUN 52 (*)    Creatinine, Ser 1.76 (*)    GFR, Estimated 29 (*)    All other components within normal limits  CBC WITH DIFFERENTIAL/PLATELET - Abnormal; Notable for the following components:   RBC 3.43 (*)    Hemoglobin 11.1 (*)    HCT 32.8 (*)    All other components within normal limits  CBG MONITORING, ED - Abnormal; Notable for the following components:   Glucose-Capillary 238 (*)    All other components within normal limits  LACTIC ACID, PLASMA  ETHANOL  PROTIME-INR  APTT  URINALYSIS, W/ REFLEX TO CULTURE (INFECTION SUSPECTED)  LACTIC ACID, PLASMA  RAPID URINE DRUG SCREEN, HOSP PERFORMED  I-STAT CHEM 8, ED  TROPONIN I (HIGH SENSITIVITY)    EKG None  Radiology DG Chest Port 1 View Result Date: 07/15/2023 CLINICAL DATA:  Altered mental status EXAM: PORTABLE CHEST 1 VIEW COMPARISON:  06/21/2021 FINDINGS: Heart size is upper limits of normal, likely accentuated by technique. Aortic atherosclerosis. Low lung volumes. No focal airspace consolidation, pleural effusion, or pneumothorax. Degenerative changes of both shoulders. IMPRESSION: No active disease. Electronically Signed   By: Duanne Guess D.O.   On: 07/15/2023 17:34   CT Head Wo Contrast Result Date: 07/15/2023 CLINICAL DATA:  Delirium EXAM: CT HEAD WITHOUT CONTRAST TECHNIQUE: Contiguous axial images were obtained from the base of the skull through the vertex without intravenous contrast. RADIATION DOSE REDUCTION: This exam was performed according to the departmental dose-optimization program which includes automated exposure control, adjustment of the mA and/or kV according to patient size and/or use of iterative reconstruction technique. COMPARISON:  Head CT 06/21/21, Brain MR 09/28/20 FINDINGS: Brain: No evidence of acute infarction, hemorrhage, hydrocephalus, extra-axial collection or mass lesion/mass effect. Generalized volume loss  without lobar predominance. There is a background of mild chronic microvascular ischemic change. Vascular: No hyperdense vessel or unexpected calcification. Skull: Normal. Negative for fracture or focal lesion. Sinuses/Orbits: No middle ear or mastoid effusion. Paranasal sinuses are notable polypoid mucosal thickening in the bilateral maxillary sinuses. Orbits are unremarkable. Other: None. IMPRESSION: No hemorrhage or CT evidence of an acute cortical infarct Findings were discussed with Dr. Estell Harpin on 07/15/23 at 5:11 PM. Electronically Signed   By: Lorenza Cambridge M.D.   On: 07/15/2023 17:12    Procedures Procedures    Medications Ordered in ED Medications  sodium chloride 0.9 % bolus 500 mL (has no administration in time range)  insulin aspart (novoLOG) injection 4 Units (has no administration in time range)    ED Course/ Medical Decision Making/ A&P                                 Medical Decision Making Patient does remain stable at this time.  Patient was evaluated by teleneurology, Dr. Otelia Limes, who notes that he does not believe the patient is suffering from underlying CVA or TIA at this point.  CT scan of the head demonstrated no signs of acute intracranial hemorrhage or ischemic changes.  Workup thus far in the emergency department has been unremarkable except for hyperglycemia.  Patient has been ordered IV fluids as well as insulin.  Patient has no acute findings on chest x-ray such as pneumonia, pneumothorax, hemothorax.  EKG demonstrated no acute ischemic changes and patient had a negative troponin.  Do not suspect ACS at this time.  Will sign patient out to Dr Aileen Pilot at [place] on 07/15/23 pending urinalysis and reevaluation  Amount and/or Complexity of Data Reviewed Labs: ordered. Radiology: ordered.  Risk Prescription drug management.           Final Clinical  Impression(s) / ED Diagnoses Final diagnoses:  None    Rx / DC Orders ED Discharge Orders     None          Kathlen Mody 07/15/23 Dian Situ, MD 07/16/23 [phone]

## 2023-07-15 NOTE — ED Provider Notes (Signed)
 Patient is much more alert than when she came in.  She does not want to give a urine specimen.  I spoke with the family and they stated she has had episodes like this before with urinary tract infection.  The patient and the family have decided to go ahead and treat her for urinary tract infection and have her follow-up with her PCP   Bethann Berkshire, MD 07/15/23 1950

## 2023-07-15 NOTE — Consult Note (Addendum)
 [facility] TeleNeurology Consult Services    Date of Service:  07/15/2023      Metrics: Last Known Well: 1200 Symptoms: As per HPI.  Patient is not a candidate for thrombolytic: Overall presentation does not militate in favor of stroke, with aphasia pattern being most consistent with her underlying dementia.     Location of the provider: [facility]  Location of the patient: [facility] Pre-Morbid Modified Rankin Scale: 4 Time Code Stroke Page received:  5:07 PM Time neurologist arrived:  5:18 PM Time NIHSS completed: 5:43 PM    This consult was provided via telemedicine with 2-way video and audio communication. The patient/family was informed that care would be provided in this way and agreed to receive care in this manner.   ED Physician notified of diagnostic impression and management plan at: 5:45 PM   Assessment: 78 year old female presenting with AMS after a choking spell followed by vomiting at there assisted living facility.   - Exam reveals poor orientation, bilateral mild action tremor and naming deficits, the latter exhibiting a pattern more consistent with dementia than an acute stroke.  - The patient is not a candidate for thrombolytic: Overall presentation does not militate in favor of stroke, with aphasia pattern being most consistent with her underlying dementia.  - Labs show hyperglycemia and elevated BUN and Cr with reduced eGFR. WBC normal.   - CT head: No evidence of acute infarction, hemorrhage, hydrocephalus, extra-axial collection or mass lesion/mass effect. Generalized volume loss without lobar predominance. There is a background of mild chronic microvascular ischemic change.   Recommendations: - Observation. - Assessment for possible UTI or metabolic encephalopathy.  - TSH level - May benefit from IVF but will defer to ED Team.         ------------------------------------------------------------------------------    History of Present Illness: 78 year old female with a PMHx of Alzheimer's dementia, cancer, CKD 3, hypercholesterolemia, DM and HTN who presents to the ED with AMS. Code Stroke was called when she appeared to possibly have a right facial droop. Facility staff stated that the patient was eating lunch, choked on pea, coughed it up, had a bout of vomiting and then became responsive only to pain. Family notes that they did bring her back to her room at her assisted living facility and states that she napped for approximately 4 hours which is abnormal for her baseline. They note that she continued to have difficulty with speaking. Family stated that this has happened previously when the patient's blood sugar has been elevated or when she has had a urinary tract infection.   Her mental status had improved somewhat by the time she arrived to the ED, being alert and oriented x 0. Her mentation continues to improve but is not completely back to baseline per family, who state that she is still not as talkative, engaging or with as much spontaneous movement as when she is at her normal baseline. Vitals in the ED: BP 164/78, HR 63. Glucose 238.   At baseline she does not walk on her own. She can feed herself the meals that are prepared at her facility.   Home medications include ASA, atorvastatin, donepezil and memantine.      Past Medical History: Past Medical History:  Diagnosis Date   Alzheimer's dementia (HCC) 2015   Cancer (HCC)    Diabetes (HCC)    Hypertension       Past Surgical History: Past Surgical History:  Procedure Laterality Date  ABDOMINAL HYSTERECTOMY     COLONOSCOPY N/A 05/14/2015   Procedure: COLONOSCOPY;  Surgeon: Malissa Hippo, MD;  Location: [facility];  Service: Endoscopy;  Laterality: N/A;  730   OOPHORECTOMY         Medications:  No current facility-administered medications on file prior to encounter.   Current Outpatient Medications on File Prior to Encounter   Medication Sig Dispense Refill   furosemide (LASIX) 40 MG tablet Take 40 mg by mouth 2 (two) times daily.     meloxicam (MOBIC) 15 MG tablet Take 15 mg by mouth daily.     acetaminophen (TYLENOL) 325 MG tablet Take 2 tablets (650 mg total) by mouth every 6 (six) hours as needed for mild pain, fever or headache (or Fever >/= 101). 12 tablet 0   Aspirin (VAZALORE) 81 MG CAPS Take by mouth.     atorvastatin (LIPITOR) 40 MG tablet Take 1 tablet (40 mg total) by mouth every morning. 30 tablet 0   calcitRIOL (ROCALTROL) 0.25 MCG capsule Take 1 capsule (0.25 mcg total) by mouth daily with lunch. 30 capsule 3   cholecalciferol (VITAMIN D3) 25 MCG (1000 UNIT) tablet Take 1,000 Units by mouth daily.     cyanocobalamin 1000 MCG tablet Take by mouth.     donepezil (ARICEPT) 5 MG tablet Take 5 mg by mouth daily.     ferrous sulfate 325 (65 FE) MG tablet Take 1 tablet (325 mg total) by mouth daily with breakfast. 30 tablet 0   folic acid (FOLVITE) 1 MG tablet Take 1 mg by mouth 2 (two) times daily.     insulin glargine (LANTUS SOLOSTAR) 100 UNIT/ML Solostar Pen Inject 40 Units into the skin at bedtime. (Patient taking differently: Inject 12 Units into the skin at bedtime.) 15 mL 0   labetalol (NORMODYNE) 300 MG tablet Take 300 mg by mouth 2 (two) times daily.     levothyroxine (SYNTHROID) 175 MCG tablet Take 175 mcg by mouth daily.     loperamide (IMODIUM A-D) 2 MG tablet Take 2 mg by mouth as needed for diarrhea or loose stools.     memantine (NAMENDA) 5 MG tablet Take 5 mg by mouth 2 (two) times daily.     MODERNA COVID-19 BIVAL BOOSTER 50 MCG/0.5ML injection      QUEtiapine (SEROQUEL) 25 MG tablet Take 1 tablet (25 mg total) by mouth daily after supper. (Patient taking differently: Take 25 mg by mouth 2 (two) times daily.) 30 tablet 0   sertraline (ZOLOFT) 50 MG tablet Take 50 mg by mouth daily.     TRADJENTA 5 MG TABS tablet Take 5 mg by mouth daily.     UNABLE TO FIND Diet - NAS, ConCHO            Social History: Resident of an assisted living facility   Family History:  Reviewed in Epic   ROS: As per HPI    Anticoagulant use:  No   Antiplatelet use: ASA   Examination:       1A: Level of Consciousness - 1 1B: Ask Month and Age - 2 1C: Blink Eyes & Squeeze Hands - 0 2: Test Horizontal Extraocular Movements - 0 3: Test Visual Fields - 0 4: Test Facial Palsy (Use Grimace if Obtunded) - 0 5A: Test Left Arm Motor Drift - 0 5B: Test Right Arm Motor Drift - 0 6A: Test Left Leg Motor Drift - 0 6B: Test Right Leg Motor Drift - 0 7: Test Limb Ataxia (FNF/Heel-Shin) -  0 8: Test Sensation -  0 9: Test Language/Aphasia - 1 10: Test Dysarthria - 0 11: Test Extinction/Inattention - 0   NIHSS Score: 3     Patient/Family was informed the Neurology Consult would occur via TeleHealth consult by way of interactive audio and video telecommunications and consented to receiving care in this manner.   Patient is being evaluated for possible acute neurologic impairment and high pretest probability of imminent or life-threatening deterioration. I spent total of 40 minutes providing care to this patient, including time for face to face visit via telemedicine, review of medical records, imaging studies and discussion of findings with providers, the patient and/or family.   Electronically signed: Dr. Caryl Pina

## 2023-07-15 NOTE — Discharge Instructions (Signed)
 Drink plenty of follow-up with your family doctor next week for recheck.  Return sooner problems

## 2023-07-15 NOTE — ED Triage Notes (Signed)
 Facility stated pt was eating lunch and choked on pea and coughed it up then proceeded to start talking then stooped and went to "sleep.". Pt has hx of dementia. Pt is alert and oriented x0.

## 2023-07-17 DIAGNOSIS — R55 Syncope and collapse: Secondary | ICD-10-CM | POA: Diagnosis not present

## 2023-07-17 DIAGNOSIS — M6281 Muscle weakness (generalized): Secondary | ICD-10-CM | POA: Diagnosis not present

## 2023-07-17 DIAGNOSIS — E039 Hypothyroidism, unspecified: Secondary | ICD-10-CM | POA: Diagnosis not present

## 2023-07-17 DIAGNOSIS — R279 Unspecified lack of coordination: Secondary | ICD-10-CM | POA: Diagnosis not present

## 2023-07-17 DIAGNOSIS — F0390 Unspecified dementia without behavioral disturbance: Secondary | ICD-10-CM | POA: Diagnosis not present

## 2023-07-17 DIAGNOSIS — E119 Type 2 diabetes mellitus without complications: Secondary | ICD-10-CM | POA: Diagnosis not present

## 2023-07-17 DIAGNOSIS — R4182 Altered mental status, unspecified: Secondary | ICD-10-CM | POA: Diagnosis not present

## 2023-07-17 DIAGNOSIS — I10 Essential (primary) hypertension: Secondary | ICD-10-CM | POA: Diagnosis not present

## 2023-07-17 DIAGNOSIS — E785 Hyperlipidemia, unspecified: Secondary | ICD-10-CM | POA: Diagnosis not present

## 2023-07-17 DIAGNOSIS — N189 Chronic kidney disease, unspecified: Secondary | ICD-10-CM | POA: Diagnosis not present

## 2023-07-19 ENCOUNTER — Inpatient Hospital Stay (HOSPITAL_COMMUNITY)
Admission: EM | Admit: 2023-07-19 | Discharge: 2023-07-21 | DRG: 865 | Disposition: A | Discharge status: Hospice | Payer: PPO | Source: Skilled Nursing Facility | Attending: Family Medicine | Admitting: Family Medicine

## 2023-07-19 ENCOUNTER — Other Ambulatory Visit: Payer: Self-pay

## 2023-07-19 ENCOUNTER — Observation Stay (HOSPITAL_COMMUNITY): Payer: PPO

## 2023-07-19 ENCOUNTER — Emergency Department (HOSPITAL_COMMUNITY): Payer: PPO

## 2023-07-19 DIAGNOSIS — E785 Hyperlipidemia, unspecified: Secondary | ICD-10-CM | POA: Diagnosis not present

## 2023-07-19 DIAGNOSIS — G309 Alzheimer's disease, unspecified: Secondary | ICD-10-CM | POA: Diagnosis not present

## 2023-07-19 DIAGNOSIS — Z79890 Hormone replacement therapy (postmenopausal): Secondary | ICD-10-CM

## 2023-07-19 DIAGNOSIS — Z90710 Acquired absence of both cervix and uterus: Secondary | ICD-10-CM

## 2023-07-19 DIAGNOSIS — J1081 Influenza due to other identified influenza virus with encephalopathy: Principal | ICD-10-CM | POA: Diagnosis present

## 2023-07-19 DIAGNOSIS — R404 Transient alteration of awareness: Secondary | ICD-10-CM | POA: Diagnosis not present

## 2023-07-19 DIAGNOSIS — Z8249 Family history of ischemic heart disease and other diseases of the circulatory system: Secondary | ICD-10-CM

## 2023-07-19 DIAGNOSIS — I5031 Acute diastolic (congestive) heart failure: Secondary | ICD-10-CM | POA: Diagnosis not present

## 2023-07-19 DIAGNOSIS — Z515 Encounter for palliative care: Secondary | ICD-10-CM

## 2023-07-19 DIAGNOSIS — F02818 Dementia in other diseases classified elsewhere, unspecified severity, with other behavioral disturbance: Secondary | ICD-10-CM | POA: Diagnosis present

## 2023-07-19 DIAGNOSIS — Z833 Family history of diabetes mellitus: Secondary | ICD-10-CM

## 2023-07-19 DIAGNOSIS — Z794 Long term (current) use of insulin: Secondary | ICD-10-CM | POA: Diagnosis not present

## 2023-07-19 DIAGNOSIS — I6782 Cerebral ischemia: Secondary | ICD-10-CM | POA: Diagnosis not present

## 2023-07-19 DIAGNOSIS — E039 Hypothyroidism, unspecified: Secondary | ICD-10-CM | POA: Diagnosis not present

## 2023-07-19 DIAGNOSIS — J101 Influenza due to other identified influenza virus with other respiratory manifestations: Principal | ICD-10-CM | POA: Diagnosis present

## 2023-07-19 DIAGNOSIS — Z87891 Personal history of nicotine dependence: Secondary | ICD-10-CM

## 2023-07-19 DIAGNOSIS — R5383 Other fatigue: Secondary | ICD-10-CM | POA: Diagnosis not present

## 2023-07-19 DIAGNOSIS — R0602 Shortness of breath: Secondary | ICD-10-CM | POA: Diagnosis not present

## 2023-07-19 DIAGNOSIS — I10 Essential (primary) hypertension: Secondary | ICD-10-CM | POA: Diagnosis not present

## 2023-07-19 DIAGNOSIS — Z66 Do not resuscitate: Secondary | ICD-10-CM | POA: Diagnosis present

## 2023-07-19 DIAGNOSIS — I5033 Acute on chronic diastolic (congestive) heart failure: Secondary | ICD-10-CM | POA: Diagnosis not present

## 2023-07-19 DIAGNOSIS — Z6839 Body mass index (BMI) 39.0-39.9, adult: Secondary | ICD-10-CM

## 2023-07-19 DIAGNOSIS — Z7189 Other specified counseling: Secondary | ICD-10-CM | POA: Diagnosis not present

## 2023-07-19 DIAGNOSIS — Z791 Long term (current) use of non-steroidal anti-inflammatories (NSAID): Secondary | ICD-10-CM

## 2023-07-19 DIAGNOSIS — M6281 Muscle weakness (generalized): Secondary | ICD-10-CM | POA: Diagnosis not present

## 2023-07-19 DIAGNOSIS — I6523 Occlusion and stenosis of bilateral carotid arteries: Secondary | ICD-10-CM | POA: Diagnosis not present

## 2023-07-19 DIAGNOSIS — F32A Depression, unspecified: Secondary | ICD-10-CM | POA: Diagnosis present

## 2023-07-19 DIAGNOSIS — R279 Unspecified lack of coordination: Secondary | ICD-10-CM | POA: Diagnosis not present

## 2023-07-19 DIAGNOSIS — D649 Anemia, unspecified: Secondary | ICD-10-CM | POA: Diagnosis present

## 2023-07-19 DIAGNOSIS — F0390 Unspecified dementia without behavioral disturbance: Secondary | ICD-10-CM | POA: Diagnosis not present

## 2023-07-19 DIAGNOSIS — Z79899 Other long term (current) drug therapy: Secondary | ICD-10-CM

## 2023-07-19 DIAGNOSIS — I517 Cardiomegaly: Secondary | ICD-10-CM | POA: Diagnosis not present

## 2023-07-19 DIAGNOSIS — E119 Type 2 diabetes mellitus without complications: Secondary | ICD-10-CM | POA: Diagnosis not present

## 2023-07-19 DIAGNOSIS — R0689 Other abnormalities of breathing: Secondary | ICD-10-CM | POA: Diagnosis not present

## 2023-07-19 DIAGNOSIS — R4182 Altered mental status, unspecified: Secondary | ICD-10-CM | POA: Diagnosis not present

## 2023-07-19 DIAGNOSIS — E669 Obesity, unspecified: Secondary | ICD-10-CM | POA: Diagnosis present

## 2023-07-19 DIAGNOSIS — N189 Chronic kidney disease, unspecified: Secondary | ICD-10-CM | POA: Diagnosis not present

## 2023-07-19 DIAGNOSIS — F0283 Dementia in other diseases classified elsewhere, unspecified severity, with mood disturbance: Secondary | ICD-10-CM | POA: Diagnosis present

## 2023-07-19 DIAGNOSIS — G9340 Encephalopathy, unspecified: Principal | ICD-10-CM

## 2023-07-19 DIAGNOSIS — I130 Hypertensive heart and chronic kidney disease with heart failure and stage 1 through stage 4 chronic kidney disease, or unspecified chronic kidney disease: Secondary | ICD-10-CM | POA: Diagnosis present

## 2023-07-19 DIAGNOSIS — Z7984 Long term (current) use of oral hypoglycemic drugs: Secondary | ICD-10-CM

## 2023-07-19 DIAGNOSIS — J111 Influenza due to unidentified influenza virus with other respiratory manifestations: Secondary | ICD-10-CM

## 2023-07-19 DIAGNOSIS — Z8261 Family history of arthritis: Secondary | ICD-10-CM

## 2023-07-19 DIAGNOSIS — Z8673 Personal history of transient ischemic attack (TIA), and cerebral infarction without residual deficits: Secondary | ICD-10-CM

## 2023-07-19 DIAGNOSIS — E1122 Type 2 diabetes mellitus with diabetic chronic kidney disease: Secondary | ICD-10-CM | POA: Diagnosis present

## 2023-07-19 DIAGNOSIS — Z7982 Long term (current) use of aspirin: Secondary | ICD-10-CM

## 2023-07-19 DIAGNOSIS — R062 Wheezing: Secondary | ICD-10-CM | POA: Diagnosis not present

## 2023-07-19 DIAGNOSIS — Z823 Family history of stroke: Secondary | ICD-10-CM

## 2023-07-19 DIAGNOSIS — N1832 Chronic kidney disease, stage 3b: Secondary | ICD-10-CM | POA: Diagnosis present

## 2023-07-19 LAB — URINALYSIS, W/ REFLEX TO CULTURE (INFECTION SUSPECTED)
Bilirubin Urine: NEGATIVE
Glucose, UA: NEGATIVE mg/dL
Ketones, ur: NEGATIVE mg/dL
Leukocytes,Ua: NEGATIVE
Nitrite: NEGATIVE
Protein, ur: 100 mg/dL — AB
Specific Gravity, Urine: 1.012 (ref 1.005–1.030)
pH: 5 (ref 5.0–8.0)

## 2023-07-19 LAB — BLOOD GAS, VENOUS
Acid-Base Excess: 1.2 mmol/L (ref 0.0–2.0)
Bicarbonate: 26 mmol/L (ref 20.0–28.0)
Drawn by: 28144
O2 Saturation: 53.8 %
Patient temperature: 36.9
pCO2, Ven: 41 mmHg — ABNORMAL LOW (ref 44–60)
pH, Ven: 7.41 (ref 7.25–7.43)
pO2, Ven: <31 mmHg — CL (ref 32–45)

## 2023-07-19 LAB — BRAIN NATRIURETIC PEPTIDE: B Natriuretic Peptide: 286 pg/mL — ABNORMAL HIGH (ref 0.0–100.0)

## 2023-07-19 LAB — COMPREHENSIVE METABOLIC PANEL
ALT: 15 U/L (ref 0–44)
AST: 16 U/L (ref 15–41)
Albumin: 3.2 g/dL — ABNORMAL LOW (ref 3.5–5.0)
Alkaline Phosphatase: 67 U/L (ref 38–126)
Anion gap: 15 (ref 5–15)
BUN: 52 mg/dL — ABNORMAL HIGH (ref 8–23)
CO2: 20 mmol/L — ABNORMAL LOW (ref 22–32)
Calcium: 9 mg/dL (ref 8.9–10.3)
Chloride: 101 mmol/L (ref 98–111)
Creatinine, Ser: 1.93 mg/dL — ABNORMAL HIGH (ref 0.44–1.00)
GFR, Estimated: 26 mL/min — ABNORMAL LOW (ref >60)
Glucose, Bld: 208 mg/dL — ABNORMAL HIGH (ref 70–99)
Potassium: 3.9 mmol/L (ref 3.5–5.1)
Sodium: 136 mmol/L (ref 135–145)
Total Bilirubin: 0.4 mg/dL (ref 0.0–1.2)
Total Protein: 6.6 g/dL (ref 6.5–8.1)

## 2023-07-19 LAB — CBC WITH DIFFERENTIAL/PLATELET
Abs Immature Granulocytes: 0.03 10*3/uL (ref 0.00–0.07)
Basophils Absolute: 0 10*3/uL (ref 0.0–0.1)
Basophils Relative: 1 %
Eosinophils Absolute: 0 10*3/uL (ref 0.0–0.5)
Eosinophils Relative: 1 %
HCT: 28.8 % — ABNORMAL LOW (ref 36.0–46.0)
Hemoglobin: 9.7 g/dL — ABNORMAL LOW (ref 12.0–15.0)
Immature Granulocytes: 1 %
Lymphocytes Relative: 10 %
Lymphs Abs: 0.7 10*3/uL (ref 0.7–4.0)
MCH: 31.7 pg (ref 26.0–34.0)
MCHC: 33.7 g/dL (ref 30.0–36.0)
MCV: 94.1 fL (ref 80.0–100.0)
Monocytes Absolute: 0.8 10*3/uL (ref 0.1–1.0)
Monocytes Relative: 12 %
Neutro Abs: 5.1 10*3/uL (ref 1.7–7.7)
Neutrophils Relative %: 75 %
Platelets: 186 10*3/uL (ref 150–400)
RBC: 3.06 MIL/uL — ABNORMAL LOW (ref 3.87–5.11)
RDW: 12.7 % (ref 11.5–15.5)
WBC: 6.6 10*3/uL (ref 4.0–10.5)
nRBC: 0 % (ref 0.0–0.2)

## 2023-07-19 LAB — LACTIC ACID, PLASMA: Lactic Acid, Venous: 1 mmol/L (ref 0.5–1.9)

## 2023-07-19 LAB — TROPONIN I (HIGH SENSITIVITY)
Troponin I (High Sensitivity): 14 ng/L (ref <18)
Troponin I (High Sensitivity): 17 ng/L (ref <18)

## 2023-07-19 LAB — RESP PANEL BY RT-PCR (RSV, FLU A&B, COVID)  RVPGX2
Influenza A by PCR: POSITIVE — AB
Influenza B by PCR: NEGATIVE
Resp Syncytial Virus by PCR: NEGATIVE
SARS Coronavirus 2 by RT PCR: NEGATIVE

## 2023-07-19 MED ORDER — SODIUM CHLORIDE 0.9 % IV SOLN: INTRAVENOUS | Status: AC

## 2023-07-19 MED ORDER — FOLIC ACID 1 MG PO TABS
1.0000 mg | ORAL_TABLET | Freq: Two times a day (BID) | ORAL | Status: DC
  Administered 2023-07-20 – 2023-07-21 (×4): 1 mg via ORAL
  Filled 2023-07-19 (×4): qty 1

## 2023-07-19 MED ORDER — OSELTAMIVIR PHOSPHATE 75 MG PO CAPS: 75.0000 mg | ORAL_CAPSULE | Freq: Once | ORAL | Status: DC

## 2023-07-19 MED ORDER — SERTRALINE HCL 50 MG PO TABS
50.0000 mg | ORAL_TABLET | Freq: Every day | ORAL | Status: DC
  Administered 2023-07-20 – 2023-07-21 (×2): 50 mg via ORAL
  Filled 2023-07-19 (×2): qty 1

## 2023-07-19 MED ORDER — LABETALOL HCL 200 MG PO TABS
300.0000 mg | ORAL_TABLET | Freq: Two times a day (BID) | ORAL | Status: DC
  Administered 2023-07-20 – 2023-07-21 (×4): 300 mg via ORAL
  Filled 2023-07-19 (×4): qty 2

## 2023-07-19 MED ORDER — LEVOTHYROXINE SODIUM 50 MCG PO TABS
175.0000 ug | ORAL_TABLET | Freq: Every day | ORAL | Status: DC
  Administered 2023-07-20 – 2023-07-21 (×2): 175 ug via ORAL
  Filled 2023-07-19 (×2): qty 1

## 2023-07-19 MED ORDER — CALCITRIOL 0.25 MCG PO CAPS
0.2500 ug | ORAL_CAPSULE | Freq: Every day | ORAL | Status: DC
  Administered 2023-07-21: 0.25 ug via ORAL
  Filled 2023-07-19: qty 1

## 2023-07-19 MED ORDER — TRAZODONE HCL 50 MG PO TABS: 25.0000 mg | ORAL_TABLET | Freq: Every evening | ORAL | Status: DC | PRN

## 2023-07-19 MED ORDER — FUROSEMIDE 40 MG PO TABS: 40.0000 mg | ORAL_TABLET | Freq: Two times a day (BID) | ORAL | Status: DC

## 2023-07-19 MED ORDER — VITAMIN B-12 1000 MCG PO TABS
1000.0000 ug | ORAL_TABLET | Freq: Every day | ORAL | Status: DC
  Administered 2023-07-20 – 2023-07-21 (×2): 1000 ug via ORAL
  Filled 2023-07-19 (×2): qty 1

## 2023-07-19 MED ORDER — ACETAMINOPHEN 650 MG RE SUPP
650.0000 mg | Freq: Four times a day (QID) | RECTAL | Status: DC | PRN
  Administered 2023-07-20: 650 mg via RECTAL
  Filled 2023-07-19: qty 1

## 2023-07-19 MED ORDER — ENOXAPARIN SODIUM 30 MG/0.3ML IJ SOSY
30.0000 mg | PREFILLED_SYRINGE | INTRAMUSCULAR | Status: DC
Start: 2023-07-20 — End: 2023-07-22
  Administered 2023-07-20 – 2023-07-21 (×2): 30 mg via SUBCUTANEOUS
  Filled 2023-07-19 (×2): qty 0.3

## 2023-07-19 MED ORDER — FUROSEMIDE 10 MG/ML IJ SOLN
40.0000 mg | Freq: Two times a day (BID) | INTRAMUSCULAR | Status: DC
  Administered 2023-07-19: 40 mg via INTRAVENOUS
  Filled 2023-07-19: qty 4

## 2023-07-19 MED ORDER — MEMANTINE HCL 10 MG PO TABS
5.0000 mg | ORAL_TABLET | Freq: Two times a day (BID) | ORAL | Status: DC
  Administered 2023-07-20 – 2023-07-21 (×4): 5 mg via ORAL
  Filled 2023-07-19 (×4): qty 1

## 2023-07-19 MED ORDER — FERROUS SULFATE 325 (65 FE) MG PO TABS
325.0000 mg | ORAL_TABLET | Freq: Every day | ORAL | Status: DC
  Administered 2023-07-20 – 2023-07-21 (×2): 325 mg via ORAL
  Filled 2023-07-19 (×2): qty 1

## 2023-07-19 MED ORDER — ONDANSETRON HCL 4 MG PO TABS
4.0000 mg | ORAL_TABLET | Freq: Four times a day (QID) | ORAL | Status: DC | PRN
Start: 2023-07-19 — End: 2023-07-22

## 2023-07-19 MED ORDER — VITAMIN D 25 MCG (1000 UNIT) PO TABS
1000.0000 [IU] | ORAL_TABLET | Freq: Every day | ORAL | Status: DC
  Administered 2023-07-20 – 2023-07-21 (×2): 1000 [IU] via ORAL
  Filled 2023-07-19 (×2): qty 1

## 2023-07-19 MED ORDER — QUETIAPINE FUMARATE 25 MG PO TABS
25.0000 mg | ORAL_TABLET | Freq: Two times a day (BID) | ORAL | Status: DC
  Administered 2023-07-20 – 2023-07-21 (×4): 25 mg via ORAL
  Filled 2023-07-19 (×4): qty 1

## 2023-07-19 MED ORDER — MAGNESIUM HYDROXIDE 400 MG/5ML PO SUSP: 30.0000 mL | Freq: Every day | ORAL | Status: DC | PRN

## 2023-07-19 MED ORDER — INSULIN GLARGINE-YFGN 100 UNIT/ML ~~LOC~~ SOLN
40.0000 [IU] | Freq: Every day | SUBCUTANEOUS | Status: DC
  Filled 2023-07-19 (×2): qty 0.4

## 2023-07-19 MED ORDER — DONEPEZIL HCL 5 MG PO TABS
5.0000 mg | ORAL_TABLET | Freq: Every day | ORAL | Status: DC
  Administered 2023-07-20 – 2023-07-21 (×2): 5 mg via ORAL
  Filled 2023-07-19 (×2): qty 1

## 2023-07-19 MED ORDER — LINAGLIPTIN 5 MG PO TABS
5.0000 mg | ORAL_TABLET | Freq: Every day | ORAL | Status: DC
  Administered 2023-07-20 – 2023-07-21 (×2): 5 mg via ORAL
  Filled 2023-07-19 (×2): qty 1

## 2023-07-19 MED ORDER — ATORVASTATIN CALCIUM 40 MG PO TABS
40.0000 mg | ORAL_TABLET | Freq: Every morning | ORAL | Status: DC
Start: 2023-07-20 — End: 2023-07-22
  Administered 2023-07-20 – 2023-07-21 (×2): 40 mg via ORAL
  Filled 2023-07-19 (×2): qty 1

## 2023-07-19 MED ORDER — ACETAMINOPHEN 325 MG PO TABS: 650.0000 mg | ORAL_TABLET | Freq: Four times a day (QID) | ORAL | Status: DC | PRN

## 2023-07-19 MED ORDER — ONDANSETRON HCL 4 MG/2ML IJ SOLN: 4.0000 mg | Freq: Four times a day (QID) | INTRAMUSCULAR | Status: DC | PRN

## 2023-07-19 NOTE — Assessment & Plan Note (Signed)
 -  We will continue Synthroid.

## 2023-07-19 NOTE — Assessment & Plan Note (Signed)
-   We will continue Aricept, Namenda and Seroquel. - We will continue Zoloft for associated depression.

## 2023-07-19 NOTE — Assessment & Plan Note (Signed)
-   She will be placed on supplemental coverage with NovoLog. - We will continue basal coverage. - We will continue Tradjenta.

## 2023-07-19 NOTE — Assessment & Plan Note (Signed)
-   We will continue antihypertensive therapy.

## 2023-07-19 NOTE — Assessment & Plan Note (Signed)
-   This is likely secondary to #1. - She had no clear evidence for pneumonia or current UTI.  She has been empirically treated for UTI the last 4 days.

## 2023-07-19 NOTE — ED Provider Notes (Signed)
 [place] EMERGENCY DEPARTMENT AT [facility] Provider Note   CSN: [id number] Arrival date & time: 07/19/23  1945     History {Add pertinent medical, surgical, social history, OB history to HPI:1} Chief Complaint  Patient presents with   Shortness of Breath    Courtney [name] is a 78 y.o. female.  Level 5 caveat secondary to altered mental status.  Patient is brought in by EMS from her nursing home for being more lethargic and possibly short of breath.  Patient herself denies any complaints.  She has a history of dementia diabetes stroke.  Husband states she was here few days ago for decreased responsiveness.  She improved during her time in the emergency department and no clear etiology was identified.  She was started on antibiotic at family request although no urine was obtained.  Family states she was doing well.  They saw her this morning and she was napping but woke up easily and conversed.  The history is provided by the patient and the EMS personnel.  Altered Mental Status Presenting symptoms: lethargy   Most recent episode:  Today Timing:  Constant Progression:  Unchanged Chronicity:  Recurrent Context: nursing home resident        Home Medications Prior to Admission medications   Medication Sig Start Date End Date Taking? Authorizing Provider  acetaminophen (TYLENOL) 325 MG tablet Take 2 tablets (650 mg total) by mouth every 6 (six) hours as needed for mild pain, fever or headache (or Fever >/= 101). 01/24/19   Emokpae, Courage, MD  Aspirin (VAZALORE) 81 MG CAPS Take by mouth. 08/21/19   [provider]  atorvastatin (LIPITOR) 40 MG tablet Take 1 tablet (40 mg total) by mouth every morning. 10/05/20   Sharee Holster, NP  calcitRIOL (ROCALTROL) 0.25 MCG capsule Take 1 capsule (0.25 mcg total) by mouth daily with lunch. 09/22/22   Roma Kayser, MD  cephALEXin (KEFLEX) 500 MG capsule Take 1 capsule (500 mg total) by mouth 4 (four) times daily. 07/15/23    Bethann Berkshire, MD  cholecalciferol (VITAMIN D3) 25 MCG (1000 UNIT) tablet Take 1,000 Units by mouth daily.    [provider]  cyanocobalamin 1000 MCG tablet Take by mouth.    [provider]  donepezil (ARICEPT) 5 MG tablet Take 5 mg by mouth daily.    [provider]  ferrous sulfate 325 (65 FE) MG tablet Take 1 tablet (325 mg total) by mouth daily with breakfast. 10/05/20   Sharee Holster, NP  folic acid (FOLVITE) 1 MG tablet Take 1 mg by mouth 2 (two) times daily.    [provider]  furosemide (LASIX) 40 MG tablet Take 40 mg by mouth 2 (two) times daily. 05/30/23   [provider]  insulin glargine (LANTUS SOLOSTAR) 100 UNIT/ML Solostar Pen Inject 40 Units into the skin at bedtime. Patient taking differently: Inject 12 Units into the skin at bedtime. 10/05/20   Sharee Holster, NP  labetalol (NORMODYNE) 300 MG tablet Take 300 mg by mouth 2 (two) times daily.    [provider]  levothyroxine (SYNTHROID) 175 MCG tablet Take 175 mcg by mouth daily.    [provider]  loperamide (IMODIUM A-D) 2 MG tablet Take 2 mg by mouth as needed for diarrhea or loose stools.    [provider]  meloxicam (MOBIC) 15 MG tablet Take 15 mg by mouth daily. 05/23/23   [provider]  memantine (NAMENDA) 5 MG tablet Take 5  mg by mouth 2 (two) times daily.    [provider]  MODERNA COVID-19 BIVAL BOOSTER 50 MCG/0.5ML injection  04/02/21   [provider]  QUEtiapine (SEROQUEL) 25 MG tablet Take 1 tablet (25 mg total) by mouth daily after supper. Patient taking differently: Take 25 mg by mouth 2 (two) times daily. 10/05/20   Sharee Holster, NP  sertraline (ZOLOFT) 50 MG tablet Take 50 mg by mouth daily.    [provider]  TRADJENTA 5 MG TABS tablet Take 5 mg by mouth daily.    [provider]  UNABLE TO FIND Diet - NAS, ConCHO    [provider]      Allergies    Patient has no  known allergies.    Review of Systems   Review of Systems  Unable to perform ROS: Dementia    Physical Exam Updated Vital Signs BP (!) 175/76   Pulse 71   Temp 98.5 F (36.9 C) (Oral)   Resp (!) 25   SpO2 95%  Physical Exam Vitals and nursing note reviewed.  Constitutional:      General: She is not in acute distress.    Appearance: She is well-developed.  HENT:     Head: Normocephalic and atraumatic.  Eyes:     Conjunctiva/sclera: Conjunctivae normal.  Cardiovascular:     Rate and Rhythm: Normal rate and regular rhythm.     Heart sounds: No murmur heard. Pulmonary:     Effort: Pulmonary effort is normal. No respiratory distress.     Breath sounds: Normal breath sounds.  Abdominal:     Palpations: Abdomen is soft.     Tenderness: There is no abdominal tenderness.  Musculoskeletal:        General: No swelling.     Cervical back: Neck supple.     Right lower leg: No tenderness. Edema present.     Left lower leg: No tenderness. Edema present.  Skin:    General: Skin is warm and dry.     Capillary Refill: Capillary refill takes less than 2 seconds.  Neurological:     General: No focal deficit present.     Mental Status: She is disoriented.     Comments: Patient is oriented to self but does not know the time or the situation     ED Results / Procedures / Treatments   Labs (all labs ordered are listed, but only abnormal results are displayed) Labs Reviewed  RESP PANEL BY RT-PCR (RSV, FLU A&B, COVID)  [id number]    EKG None  Radiology No results found.  Procedures Procedures  {Document cardiac monitor, telemetry assessment procedure when appropriate:1}  Medications Ordered in ED Medications - No data to display  ED Course/ Medical Decision Making/ A&P   {   Click here for ABCD2, HEART and other calculatorsREFRESH Note before signing :1}                              Medical Decision Making Amount and/or Complexity of Data Reviewed Labs:  ordered. Radiology: ordered.   This patient complains of ***; this involves an extensive number of treatment Options and is a complaint that carries with it a high risk of complications and morbidity. The differential includes ***  I ordered, reviewed and interpreted labs, which included *** I ordered medication *** and reviewed PMP when indicated. I ordered imaging studies which included *** and I independently    visualized  and interpreted imaging which showed *** Additional history obtained from *** Previous records obtained and reviewed *** I consulted *** and discussed lab and imaging findings and discussed disposition.  Cardiac monitoring reviewed, *** Social determinants considered, *** Critical Interventions: ***  After the interventions stated above, I reevaluated the patient and found *** Admission and further testing considered, ***   {Document critical care time when appropriate:1} {Document review of labs and clinical decision tools ie heart score, Chads2Vasc2 etc:1}  {Document your independent review of radiology images, and any outside records:1} {Document your discussion with family members, caretakers, and with consultants:1} {Document social determinants of health affecting pt's care:1} {Document your decision making why or why not admission, treatments were needed:1} Final Clinical Impression(s) / ED Diagnoses Final diagnoses:  None    Rx / DC Orders ED Discharge Orders     None

## 2023-07-19 NOTE — H&P (Signed)
 [place]   PATIENT NAME: Courtney [name]    MR#:  [id number]  DATE OF BIRTH:  November 16, 1945  DATE OF ADMISSION:  07/19/2023  PRIMARY CARE PHYSICIAN: Emiliano Dyer, FNP   Patient is coming from: SNF  REQUESTING/REFERRING PHYSICIAN: Meridee Score, MD  CHIEF COMPLAINT:   Chief Complaint  Patient presents with   Shortness of Breath    HISTORY OF PRESENT ILLNESS:  Courtney [name] is a 78 y.o. Caucasian female with medical history significant for Alzheimer's disease with behavioral changes, type 2 diabetes mellitus, with stage IIIb chronic kidney disease, hypertension, depression and dyslipidemia, who presented to the emergency room with acute onset of altered mental status with decreased responsiveness and suspected shortness of breath.  No fever or chills.  No nausea or vomiting or abdominal pain were reported.  No reported chest pain or palpitations.  She has been having congested cough today without wheezing.  No reported fever or chills per her husband but she had fever here in the ER tonight.  She was seen in the ER with decreased responsiveness 4 days ago and had workup including labs and head CT scan that came back unremarkable.  She was given empiric therapy for UTI but a urine sample was not obtained as she refused.  ED Course: When she came to the ER, temperature was 98.5 and later 100.8/38.2 with a BP of 175/76 and respiratory rate of 23 then 25 and pulse continues 95% on room air with a heart rate of 71.  UA showed 0-5 WBCs with 11-20 RBCs and rare bacteria 100 protein and negative nitrite.  VBG showed pH 7.41 and HCO3 of 26.  CBC showed anemia slightly worse than previous levels 4 days ago.  Lactic acid was 1 and BNP was 286 with high sensitive troponin I 14 and later 17.  CMP was remarkable for BUN of 52 and creatinine 1.93 slightly above her previous levels with a blood glucose of 208 and CO2 of 20. EKG as reviewed by me : EKG showed normal sinus rhythm with a rate of 71  with slightly poor R wave progression. Imaging: Portable chest x-ray showed cardiomegaly with no acute cardiopulmonary disease. Noncontrasted head CT scan is currently pending.  The patient will be admitted to a medical telemetry observation bed for further evaluation and management.  PAST MEDICAL HISTORY:   Past Medical History:  Diagnosis Date   Alzheimer's dementia (HCC) 2015   Cancer (HCC)    Diabetes (HCC)    Hypertension     PAST SURGICAL HISTORY:   Past Surgical History:  Procedure Laterality Date   ABDOMINAL HYSTERECTOMY     COLONOSCOPY N/A 05/14/2015   Procedure: COLONOSCOPY;  Surgeon: Malissa Hippo, MD;  Location: [facility];  Service: Endoscopy;  Laterality: N/A;  730   OOPHORECTOMY      SOCIAL HISTORY:   Social History   Tobacco Use   Smoking status: Former    Current packs/day: 0.00    Average packs/day: 0.5 packs/day for 10.0 years (5.0 ttl pk-yrs)    Types: Cigarettes    Start date: 05/22/1994    Quit date: 05/22/2004    Years since quitting: 19.1   Smokeless tobacco: Never  Substance Use Topics   Alcohol use: Yes    Alcohol/week: 0.0 standard drinks of alcohol    Comment: social    FAMILY HISTORY:   Family History  Problem Relation Age of Onset   Rheum arthritis Mother  Heart failure Mother    Hypertension Mother    Stroke Father    Diabetes Brother    Diabetes Sister    Hypertension Maternal Grandmother     DRUG ALLERGIES:  No Known Allergies  REVIEW OF SYSTEMS:   ROS As per history of present illness. All pertinent systems were reviewed above. Constitutional, HEENT, cardiovascular, respiratory, GI, GU, musculoskeletal, neuro, psychiatric, endocrine, integumentary and hematologic systems were reviewed and are otherwise negative/unremarkable except for positive findings mentioned above in the HPI.   MEDICATIONS AT HOME:   Prior to Admission medications   Medication Sig Start Date End Date Taking? Authorizing Provider   acetaminophen (TYLENOL) 325 MG tablet Take 2 tablets (650 mg total) by mouth every 6 (six) hours as needed for mild pain, fever or headache (or Fever >/= 101). 01/24/19   Emokpae, Courage, MD  Aspirin (VAZALORE) 81 MG CAPS Take by mouth. 08/21/19   [provider]  atorvastatin (LIPITOR) 40 MG tablet Take 1 tablet (40 mg total) by mouth every morning. 10/05/20   Sharee Holster, NP  calcitRIOL (ROCALTROL) 0.25 MCG capsule Take 1 capsule (0.25 mcg total) by mouth daily with lunch. 09/22/22   Roma Kayser, MD  cephALEXin (KEFLEX) 500 MG capsule Take 1 capsule (500 mg total) by mouth 4 (four) times daily. 07/15/23   Bethann Berkshire, MD  cholecalciferol (VITAMIN D3) 25 MCG (1000 UNIT) tablet Take 1,000 Units by mouth daily.    [provider]  cyanocobalamin 1000 MCG tablet Take by mouth.    [provider]  donepezil (ARICEPT) 5 MG tablet Take 5 mg by mouth daily.    [provider]  ferrous sulfate 325 (65 FE) MG tablet Take 1 tablet (325 mg total) by mouth daily with breakfast. 10/05/20   Sharee Holster, NP  folic acid (FOLVITE) 1 MG tablet Take 1 mg by mouth 2 (two) times daily.    [provider]  furosemide (LASIX) 40 MG tablet Take 40 mg by mouth 2 (two) times daily. 05/30/23   [provider]  insulin glargine (LANTUS SOLOSTAR) 100 UNIT/ML Solostar Pen Inject 40 Units into the skin at bedtime. Patient taking differently: Inject 12 Units into the skin at bedtime. 10/05/20   Sharee Holster, NP  labetalol (NORMODYNE) 300 MG tablet Take 300 mg by mouth 2 (two) times daily.    [provider]  levothyroxine (SYNTHROID) 175 MCG tablet Take 175 mcg by mouth daily.    [provider]  loperamide (IMODIUM A-D) 2 MG tablet Take 2 mg by mouth as needed for diarrhea or loose stools.    [provider]  meloxicam (MOBIC) 15 MG tablet Take 15 mg by mouth daily. 05/23/23   [provider]  memantine (NAMENDA) 5 MG  tablet Take 5 mg by mouth 2 (two) times daily.    [provider]  MODERNA COVID-19 BIVAL BOOSTER 50 MCG/0.5ML injection  04/02/21   [provider]  QUEtiapine (SEROQUEL) 25 MG tablet Take 1 tablet (25 mg total) by mouth daily after supper. Patient taking differently: Take 25 mg by mouth 2 (two) times daily. 10/05/20   Sharee Holster, NP  sertraline (ZOLOFT) 50 MG tablet Take 50 mg by mouth daily.    [provider]  TRADJENTA 5 MG TABS tablet Take 5 mg by mouth daily.    [provider]  UNABLE TO FIND Diet - NAS, ConCHO    [provider]      VITAL  SIGNS:  Blood pressure (!) 164/67, pulse 70, temperature (!) 100.8 F (38.2 C), temperature source Rectal, resp. rate 17, SpO2 95%.  PHYSICAL EXAMINATION:  Physical Exam  GENERAL:  78 y.o.-year-old Caucasian female patient lying in the bed with no acute distress.  She was fairly somnolent and only responsive to painful stimuli. EYES: Pupils equal, round, reactive to light and accommodation. No scleral icterus. Extraocular muscles intact.  HEENT: Head atraumatic, normocephalic. Oropharynx and nasopharynx clear.  NECK:  Supple, no jugular venous distention. No thyroid enlargement, no tenderness.  LUNGS: Normal breath sounds bilaterally, no wheezing, rales,rhonchi or crepitation. No use of accessory muscles of respiration.  CARDIOVASCULAR: Regular rate and rhythm, S1, S2 normal. No murmurs, rubs, or gallops.  ABDOMEN: Soft, nondistended, nontender. Bowel sounds present. No organomegaly or mass.  EXTREMITIES: No pedal edema, cyanosis, or clubbing.  NEUROLOGIC: No lateralizing signs.  She was fairly somnolent and only responsive to painful stimuli.  PSYCHIATRIC: Very somnolent. SKIN: No obvious rash, lesion, or ulcer.   LABORATORY PANEL:   CBC Recent Labs  Lab 07/19/23 2003  WBC 6.6  HGB 9.7*  HCT 28.8*  PLT 186    ------------------------------------------------------------------------------------------------------------------  Chemistries  Recent Labs  Lab 07/19/23 2003  NA 136  K 3.9  CL 101  CO2 20*  GLUCOSE 208*  BUN 52*  CREATININE 1.93*  CALCIUM 9.0  AST 16  ALT 15  ALKPHOS 67  BILITOT 0.4   ------------------------------------------------------------------------------------------------------------------  Cardiac Enzymes No results for input(s): "TROPONINI" in the last 168 hours. ------------------------------------------------------------------------------------------------------------------  RADIOLOGY:  CT Head Wo Contrast Result Date: 07/19/2023 CLINICAL DATA:  Mental status change EXAM: CT HEAD WITHOUT CONTRAST TECHNIQUE: Contiguous axial images were obtained from the base of the skull through the vertex without intravenous contrast. RADIATION DOSE REDUCTION: This exam was performed according to the departmental dose-optimization program which includes automated exposure control, adjustment of the mA and/or kV according to patient size and/or use of iterative reconstruction technique. COMPARISON:  Head CT 07/15/2023 FINDINGS: Brain: No evidence of acute infarction, hemorrhage, hydrocephalus, extra-axial collection or mass lesion/mass effect. Again seen is mild diffuse atrophy. There stable mild periventricular white matter hypodensity. There stable old small infarcts in the left basal ganglia. Vascular: Atherosclerotic calcifications are present within the cavernous internal carotid arteries. Skull: Normal. Negative for fracture or focal lesion. Sinuses/Orbits: No acute finding. Other: None. IMPRESSION: 1. No acute intracranial process. 2. Stable atrophy and chronic small vessel ischemic changes. 3. Stable old small infarcts in the left basal ganglia. Electronically Signed   By: Darliss Cheney M.D.   On: 07/19/2023 23:27   DG Chest Port 1 View Result Date: 07/19/2023 CLINICAL DATA:   Lethargic EXAM: PORTABLE CHEST 1 VIEW COMPARISON:  07/15/2023 FINDINGS: Cardiomegaly. No confluent airspace opacities, effusions or edema. No acute bony abnormality. IMPRESSION: Cardiomegaly.  No active disease. Electronically Signed   By: Charlett Nose M.D.   On: 07/19/2023 20:30      IMPRESSION AND PLAN:  Assessment and Plan: * Influenza A - This could be the main culprit for her acute encephalopathy. - She will be admitted to an observation medical telemetry bed. - Will monitor neuro checks every 4 hours for 24 hours and her mental status. - We will place her on p.o. Tamiflu. - 2 blood cultures were drawn. - I will hold off on further IV antibiotic treatment as she just had a course of antibiotics.  Acute encephalopathy - This is likely secondary to #1. - She had no clear evidence for pneumonia  or current UTI.  She has been empirically treated for UTI the last 4 days.  Acute on chronic diastolic CHF (congestive heart failure) (HCC) - She had a history of grade 2 diastolic dysfunction with EF of 60 to 65%.  2D echo on 08/21/2019. - We will obtain a repeat 2D echo. - Will diurese with IV Lasix given cardiomegaly and elevated BNP with dyspnea. -We will follow serial troponins.  Essential hypertension - We will continue antihypertensive therapy.  Type 2 diabetes mellitus with chronic kidney disease, with long-term current use of insulin (HCC) - She will be placed on supplemental coverage with NovoLog. - We will continue basal coverage. - We will continue Tradjenta.  Dyslipidemia - We will continue statin therapy.  Alzheimer's dementia with behavioral disturbance (HCC) - We will continue Aricept, Namenda and Seroquel. - We will continue Zoloft for associated depression.  Hypothyroidism - We will continue Synthroid.   DVT prophylaxis: Lovenox. Advanced Care Planning:  Code Status: The patient is DNR and DNI. Family Communication:  The plan of care was discussed in details  with the patient (and family). I answered all questions. The patient agreed to proceed with the above mentioned plan. Further management will depend upon hospital course. Disposition Plan: Back to previous home environment Consults called: none.  All the records are reviewed and case discussed with ED provider.  Status is: Observation  I certify that at the time of admission, it is my clinical judgment that the patient will require hospital care extending less than 2 midnights.                            Dispo: The patient is from: SNF              Anticipated d/c is to: SNF              Patient currently is not medically stable to d/c.              Difficult to place patient: No  Hannah Beat M.D on 07/19/2023 at 11:29 PM  [facility]   From 7 PM-7 AM, contact night-coverage [email or web address]  CC: Primary care physician; Emiliano Dyer, FNP

## 2023-07-19 NOTE — Assessment & Plan Note (Signed)
 -  We will continue statin therapy.

## 2023-07-19 NOTE — Assessment & Plan Note (Signed)
-   She had a history of grade 2 diastolic dysfunction with EF of 60 to 65%.  2D echo on 08/21/2019. - We will obtain a repeat 2D echo. - Will diurese with IV Lasix given cardiomegaly and elevated BNP with dyspnea. -We will follow serial troponins.

## 2023-07-19 NOTE — ED Triage Notes (Signed)
 BIB EMS from [facility] [place] for shob.  Pt has cough present.  Sats upper 90s on RA with EMS.  Placed on [place] for comfort.  Hx of dementia.  Pt is alert to self.

## 2023-07-19 NOTE — Assessment & Plan Note (Addendum)
-   This could be the main culprit for her acute encephalopathy. - She will be admitted to an observation medical telemetry bed. - Will monitor neuro checks every 4 hours for 24 hours and her mental status. - We will place her on p.o. Tamiflu. - 2 blood cultures were drawn. - I will hold off on further IV antibiotic treatment as she just had a course of antibiotics.

## 2023-07-20 ENCOUNTER — Other Ambulatory Visit (HOSPITAL_COMMUNITY): Payer: Self-pay | Admitting: *Deleted

## 2023-07-20 ENCOUNTER — Inpatient Hospital Stay (HOSPITAL_COMMUNITY): Payer: PPO

## 2023-07-20 ENCOUNTER — Encounter (HOSPITAL_COMMUNITY): Payer: Self-pay | Admitting: Family Medicine

## 2023-07-20 DIAGNOSIS — E1122 Type 2 diabetes mellitus with diabetic chronic kidney disease: Secondary | ICD-10-CM | POA: Diagnosis present

## 2023-07-20 DIAGNOSIS — Z7982 Long term (current) use of aspirin: Secondary | ICD-10-CM | POA: Diagnosis not present

## 2023-07-20 DIAGNOSIS — E669 Obesity, unspecified: Secondary | ICD-10-CM | POA: Diagnosis present

## 2023-07-20 DIAGNOSIS — E038 Other specified hypothyroidism: Secondary | ICD-10-CM | POA: Diagnosis not present

## 2023-07-20 DIAGNOSIS — F0283 Dementia in other diseases classified elsewhere, unspecified severity, with mood disturbance: Secondary | ICD-10-CM | POA: Diagnosis present

## 2023-07-20 DIAGNOSIS — D519 Vitamin B12 deficiency anemia, unspecified: Secondary | ICD-10-CM | POA: Diagnosis not present

## 2023-07-20 DIAGNOSIS — I10 Essential (primary) hypertension: Secondary | ICD-10-CM | POA: Diagnosis not present

## 2023-07-20 DIAGNOSIS — E039 Hypothyroidism, unspecified: Secondary | ICD-10-CM

## 2023-07-20 DIAGNOSIS — J101 Influenza due to other identified influenza virus with other respiratory manifestations: Secondary | ICD-10-CM | POA: Diagnosis present

## 2023-07-20 DIAGNOSIS — I130 Hypertensive heart and chronic kidney disease with heart failure and stage 1 through stage 4 chronic kidney disease, or unspecified chronic kidney disease: Secondary | ICD-10-CM | POA: Diagnosis present

## 2023-07-20 DIAGNOSIS — Z79890 Hormone replacement therapy (postmenopausal): Secondary | ICD-10-CM | POA: Diagnosis not present

## 2023-07-20 DIAGNOSIS — J1081 Influenza due to other identified influenza virus with encephalopathy: Secondary | ICD-10-CM | POA: Diagnosis present

## 2023-07-20 DIAGNOSIS — I5031 Acute diastolic (congestive) heart failure: Secondary | ICD-10-CM | POA: Diagnosis not present

## 2023-07-20 DIAGNOSIS — F02818 Dementia in other diseases classified elsewhere, unspecified severity, with other behavioral disturbance: Secondary | ICD-10-CM | POA: Diagnosis present

## 2023-07-20 DIAGNOSIS — E119 Type 2 diabetes mellitus without complications: Secondary | ICD-10-CM | POA: Diagnosis not present

## 2023-07-20 DIAGNOSIS — N1832 Chronic kidney disease, stage 3b: Secondary | ICD-10-CM | POA: Diagnosis present

## 2023-07-20 DIAGNOSIS — Z791 Long term (current) use of non-steroidal anti-inflammatories (NSAID): Secondary | ICD-10-CM | POA: Diagnosis not present

## 2023-07-20 DIAGNOSIS — F32A Depression, unspecified: Secondary | ICD-10-CM | POA: Diagnosis present

## 2023-07-20 DIAGNOSIS — E785 Hyperlipidemia, unspecified: Secondary | ICD-10-CM | POA: Diagnosis present

## 2023-07-20 DIAGNOSIS — Z7189 Other specified counseling: Secondary | ICD-10-CM | POA: Diagnosis not present

## 2023-07-20 DIAGNOSIS — D649 Anemia, unspecified: Secondary | ICD-10-CM | POA: Diagnosis present

## 2023-07-20 DIAGNOSIS — Z6839 Body mass index (BMI) 39.0-39.9, adult: Secondary | ICD-10-CM | POA: Diagnosis not present

## 2023-07-20 DIAGNOSIS — R4182 Altered mental status, unspecified: Secondary | ICD-10-CM | POA: Diagnosis not present

## 2023-07-20 DIAGNOSIS — Z79899 Other long term (current) drug therapy: Secondary | ICD-10-CM | POA: Diagnosis not present

## 2023-07-20 DIAGNOSIS — B1710 Acute hepatitis C without hepatic coma: Secondary | ICD-10-CM | POA: Diagnosis not present

## 2023-07-20 DIAGNOSIS — Z515 Encounter for palliative care: Secondary | ICD-10-CM | POA: Diagnosis not present

## 2023-07-20 DIAGNOSIS — Z87891 Personal history of nicotine dependence: Secondary | ICD-10-CM | POA: Diagnosis not present

## 2023-07-20 DIAGNOSIS — R739 Hyperglycemia, unspecified: Secondary | ICD-10-CM | POA: Diagnosis not present

## 2023-07-20 DIAGNOSIS — G309 Alzheimer's disease, unspecified: Secondary | ICD-10-CM | POA: Diagnosis present

## 2023-07-20 DIAGNOSIS — Z66 Do not resuscitate: Secondary | ICD-10-CM | POA: Diagnosis present

## 2023-07-20 DIAGNOSIS — N390 Urinary tract infection, site not specified: Secondary | ICD-10-CM | POA: Diagnosis not present

## 2023-07-20 DIAGNOSIS — G9340 Encephalopathy, unspecified: Secondary | ICD-10-CM | POA: Diagnosis not present

## 2023-07-20 DIAGNOSIS — Z7984 Long term (current) use of oral hypoglycemic drugs: Secondary | ICD-10-CM | POA: Diagnosis not present

## 2023-07-20 DIAGNOSIS — I70223 Atherosclerosis of native arteries of extremities with rest pain, bilateral legs: Secondary | ICD-10-CM | POA: Diagnosis not present

## 2023-07-20 DIAGNOSIS — I5033 Acute on chronic diastolic (congestive) heart failure: Secondary | ICD-10-CM | POA: Diagnosis present

## 2023-07-20 DIAGNOSIS — Z794 Long term (current) use of insulin: Secondary | ICD-10-CM | POA: Diagnosis not present

## 2023-07-20 DIAGNOSIS — E782 Mixed hyperlipidemia: Secondary | ICD-10-CM | POA: Diagnosis not present

## 2023-07-20 DIAGNOSIS — E559 Vitamin D deficiency, unspecified: Secondary | ICD-10-CM | POA: Diagnosis not present

## 2023-07-20 DIAGNOSIS — Z8673 Personal history of transient ischemic attack (TIA), and cerebral infarction without residual deficits: Secondary | ICD-10-CM | POA: Diagnosis not present

## 2023-07-20 LAB — CBC
HCT: 30 % — ABNORMAL LOW (ref 36.0–46.0)
Hemoglobin: 9.8 g/dL — ABNORMAL LOW (ref 12.0–15.0)
MCH: 31 pg (ref 26.0–34.0)
MCHC: 32.7 g/dL (ref 30.0–36.0)
MCV: 94.9 fL (ref 80.0–100.0)
Platelets: 181 10*3/uL (ref 150–400)
RBC: 3.16 MIL/uL — ABNORMAL LOW (ref 3.87–5.11)
RDW: 12.5 % (ref 11.5–15.5)
WBC: 5 10*3/uL (ref 4.0–10.5)
nRBC: 0 % (ref 0.0–0.2)

## 2023-07-20 LAB — BASIC METABOLIC PANEL
Anion gap: 11 (ref 5–15)
BUN: 54 mg/dL — ABNORMAL HIGH (ref 8–23)
CO2: 23 mmol/L (ref 22–32)
Calcium: 8.9 mg/dL (ref 8.9–10.3)
Chloride: 105 mmol/L (ref 98–111)
Creatinine, Ser: 1.73 mg/dL — ABNORMAL HIGH (ref 0.44–1.00)
GFR, Estimated: 30 mL/min — ABNORMAL LOW (ref >60)
Glucose, Bld: 187 mg/dL — ABNORMAL HIGH (ref 70–99)
Potassium: 3.6 mmol/L (ref 3.5–5.1)
Sodium: 139 mmol/L (ref 135–145)

## 2023-07-20 LAB — ECHOCARDIOGRAM COMPLETE
Area-P 1/2: 3.48 cm2
Calc EF: 63.7 %
Height: 61 in
S' Lateral: 3.1 cm
Single Plane A2C EF: 66.3 %
Single Plane A4C EF: 63.5 %
Weight: 3328 [oz_av]

## 2023-07-20 LAB — CBG MONITORING, ED: Glucose-Capillary: 169 mg/dL — ABNORMAL HIGH (ref 70–99)

## 2023-07-20 LAB — GLUCOSE, CAPILLARY
Glucose-Capillary: 135 mg/dL — ABNORMAL HIGH (ref 70–99)
Glucose-Capillary: 126 mg/dL — ABNORMAL HIGH (ref 70–99)

## 2023-07-20 MED ORDER — INSULIN ASPART 100 UNIT/ML IJ SOLN
0.0000 [IU] | INTRAMUSCULAR | Status: DC
  Administered 2023-07-21: 1 [IU] via SUBCUTANEOUS

## 2023-07-20 MED ORDER — INSULIN GLARGINE-YFGN 100 UNIT/ML ~~LOC~~ SOLN: 20.0000 [IU] | Freq: Every day | SUBCUTANEOUS | Status: DC

## 2023-07-20 MED ORDER — INSULIN GLARGINE-YFGN 100 UNIT/ML ~~LOC~~ SOLN
10.0000 [IU] | Freq: Every day | SUBCUTANEOUS | Status: DC
  Administered 2023-07-20 – 2023-07-21 (×2): 10 [IU] via SUBCUTANEOUS
  Filled 2023-07-20 (×2): qty 0.1

## 2023-07-20 MED ORDER — ASPIRIN 81 MG PO TBEC
81.0000 mg | DELAYED_RELEASE_TABLET | Freq: Every day | ORAL | Status: DC
  Administered 2023-07-21: 81 mg via ORAL
  Filled 2023-07-20: qty 1

## 2023-07-20 MED ORDER — OSELTAMIVIR PHOSPHATE 30 MG PO CAPS
30.0000 mg | ORAL_CAPSULE | Freq: Every day | ORAL | Status: DC
  Administered 2023-07-20 – 2023-07-21 (×2): 30 mg via ORAL
  Filled 2023-07-20 (×2): qty 1

## 2023-07-20 MED ORDER — PERFLUTREN LIPID MICROSPHERE
1.0000 mL | INTRAVENOUS | Status: AC | PRN
  Administered 2023-07-20: 1 mL via INTRAVENOUS

## 2023-07-20 NOTE — ED Notes (Signed)
 Pt care taken, no complaints at this time. Is sleeping.

## 2023-07-20 NOTE — Hospital Course (Signed)
 78 y.o. Caucasian female with medical history significant for Alzheimer's disease with behavioral changes, type 2 diabetes mellitus, with stage IIIb chronic kidney disease, hypertension, depression and dyslipidemia, who presented to the emergency room with acute onset of altered mental status with decreased responsiveness and suspected shortness of breath.  No fever or chills.  No nausea or vomiting or abdominal pain were reported.  No reported chest pain or palpitations.  She has been having congested cough today without wheezing.  No reported fever or chills per her husband but she had fever here in the ER tonight.  She was seen in the ER with decreased responsiveness 4 days ago and had workup including labs and head CT scan that came back unremarkable.  She was given empiric therapy for UTI but a urine sample was not obtained as she refused.   ED Course: When she came to the ER, temperature was 98.5 and later 100.8/38.2 with a BP of 175/76 and respiratory rate of 23 then 25 and pulse continues 95% on room air with a heart rate of 71.  UA showed 0-5 WBCs with 11-20 RBCs and rare bacteria 100 protein and negative nitrite.  VBG showed pH 7.41 and HCO3 of 26.  CBC showed anemia slightly worse than previous levels 4 days ago.  Lactic acid was 1 and BNP was 286 with high sensitive troponin I 14 and later 17.  CMP was remarkable for BUN of 52 and creatinine 1.93 slightly above her previous levels with a blood glucose of 208 and CO2 of 20. EKG as reviewed by me : EKG showed normal sinus rhythm with a rate of 71 with slightly poor R wave progression. Imaging: Portable chest x-ray showed cardiomegaly with no acute cardiopulmonary disease. Noncontrasted head CT scan is currently pending.   The patient will be admitted to a medical telemetry observation bed for further evaluation and management.

## 2023-07-20 NOTE — Progress Notes (Signed)
 Patient had large, black, tarry bowel movement.  Dr. Laural Benes notified.  Hemoccult stool sent to lab.  CBC in AM.

## 2023-07-20 NOTE — Consult Note (Signed)
 Consultation Note Date: 07/20/2023   Patient Name: Courtney [name]  DOB: [birth year]  MRN: [id number]  Age / Sex: 78 y.o., female  PCP: Emiliano Dyer, FNP Referring Physician: Cleora Fleet, MD  Reason for Consultation: Establishing goals of care  HPI/Patient Profile: 78 y.o. female  with past medical history of dementia with behavioral changes, type 2 diabetes mellitus, with stage IIIb chronic kidney disease, hypertension, depression and dyslipidemia,  admitted on 07/19/2023 with flu A, acute encephalopathy, acute on chronic diastolic heart failure, dementia.   Clinical Assessment and Goals of Care: I have reviewed medical records including EPIC notes, labs and imaging, received report from RN, assessed the patient.  Mrs. Garciaperez is lying quietly in bed.  She appears acutely/chronically ill and quite frail, obese.  She is resting comfortably, and does not interact with me in any meaningful way to gentle voice or touch.  She clearly cannot make her basic needs known.  Her husband of 40 years, Jonny Ruiz, is present at bedside.  Face-to-face discussion with bedside nursing staff related to patient condition, needs.  We meet at the bedside to discuss diagnosis prognosis, GOC, EOL wishes, disposition and options.  I introduced Palliative Medicine as specialized medical care for people living with serious illness. It focuses on providing relief from the symptoms and stress of a serious illness. The goal is to improve quality of life for both the patient and the family.  We discussed a brief life review of the patient.  Mr. Mrs. Goodwill have been married for 40 years.  They were each married before and each had 2 children.  She has a daughter, Nelta Numbers.  Jonie's son committed suicide in 2017.  Mrs. Muffley worked at [employer].  She was diagnosed with dementia in 2015.  She has been at [facility] in [place] since October 2023.   We then focused on their current illness.  We talked about Mrs. Catalina acute health concerns and the treatment plan.  We talked about flu and time for outcomes.  We talked about the natural progression for people with memory loss, what is normal and expected related to memory issues, functional status, nutritional changes.  I shared that we must eat to live and it remains to be seen how Melvinia will be able to recover.  I shared that just like I will get sick, so we will Jonny Ruiz, so we will [facility].  We talked about the expected declines that continue for those with memory loss, they do not recover as well.  We talked about returning to [facility] but also the possibility of needing a higher level of care.  John states that he is considered for a while that his wife may need a higher level of care.  We briefly discussed local skilled nursing facilities.  The natural disease trajectory and expectations at EOL were discussed.  Advanced directives, concepts specific to code status, artifical feeding and hydration, and rehospitalization were considered and discussed.  Mr. Dardis shares that he and Mozell's  daughter Babette Relic have talked at length about goals for [facility].  He readily endorses DNR, no extraordinary measures.  We talked about artificial feeding which both [name] and John decline.  Hospice and Palliative Care services outpatient were explained and offered.  We talked about the benefits of outpatient palliative services and hospice care.  John shares that [employer] brother-in-law is also at [facility] and under the care of "treat the treatable" hospice.  He shares that Babette Relic is also involved in the care of her uncle.  John tells me that he and [name] have talked about hospice care for [facility].  Discussed the importance of continued conversation with family and the medical providers regarding overall plan of care and treatment options, ensuring decisions are within the context of  the patient's values and GOCs. Questions and concerns were addressed.  Hard Choices booklet left for review. The family was encouraged to call with questions or concerns.  PMT will continue to support holistically.  Conference with attending, bedside nursing staff, transition of care team related to patient condition, needs, goals of care, disposition.   HCPOA  HCPOA -daughter, Nelta Numbers.  Husband of 40 years, Jonny Ruiz, states that although to me is healthcare power of attorney they make choices as a team.    SUMMARY OF RECOMMENDATIONS   Continue to treat the treatable but no CPR or intubation Time for outcomes Return to [facility] if possible Considering need for long-term care placement Considering outpatient palliative/hospice care   Code Status/Advance Care Planning: DNR -no tube feeding  Symptom Management:  Per hospitalist, no additional needs at this time.  Palliative Prophylaxis:  Frequent Pain Assessment, Oral Care, and Turn Reposition  Additional Recommendations (Limitations, Scope, Preferences): No Artificial Feeding and continue to treat but no CPR or intubation  Psycho-social/Spiritual:  Desire for further Chaplaincy support:no Additional Recommendations: Caregiving  Support/Resources and Education on Hospice  Prognosis:  < 6 months, would not be surprising based on advancing chronic illness burden, decreasing functional status.  Discharge Planning: To be determined, return to ALF versus long-term care placement.  Would benefit from outpatient palliative/hospice.        Primary Diagnoses: Present on Admission:  Influenza A  Alzheimer's dementia with behavioral disturbance (HCC)  Hypothyroidism   I have reviewed the medical record, interviewed the patient and family, and examined the patient. The following aspects are pertinent.  Past Medical History:  Diagnosis Date   Alzheimer's dementia (HCC) 2015   Cancer (HCC)    Diabetes (HCC)     Hypertension    Social History   Socioeconomic History   Marital status: Married    Spouse name: Not on file   Number of children: Not on file   Years of education: Not on file   Highest education level: Not on file  Occupational History   Not on file  Tobacco Use   Smoking status: Former    Current packs/day: 0.00    Average packs/day: 0.5 packs/day for 10.0 years (5.0 ttl pk-yrs)    Types: Cigarettes    Start date: 05/22/1994    Quit date: 05/22/2004    Years since quitting: 19.1   Smokeless tobacco: Never  Vaping Use   Vaping status: Never Used  Substance and Sexual Activity   Alcohol use: Yes    Alcohol/week: 0.0 standard drinks of alcohol    Comment: social   Drug use: No   Sexual activity: Never  Other Topics Concern   Not on file  Social History Narrative  Not on file   Social Drivers of Health   Financial Resource Strain: Not on file  Food Insecurity: No Food Insecurity (07/20/2023)   Hunger Vital Sign    Worried About Running Out of Food in the Last Year: Never true    Ran Out of Food in the Last Year: Never true  Transportation Needs: No Transportation Needs (07/20/2023)   PRAPARE - Administrator, Civil Service (Medical): No    Lack of Transportation (Non-Medical): No  Physical Activity: Not on file  Stress: Not on file  Social Connections: Not on file   Family History  Problem Relation Age of Onset   Rheum arthritis Mother    Heart failure Mother    Hypertension Mother    Stroke Father    Diabetes Brother    Diabetes Sister    Hypertension Maternal Grandmother    Scheduled Meds:  atorvastatin  40 mg Oral q morning   calcitRIOL  0.25 mcg Oral Q lunch   cholecalciferol  1,000 Units Oral Daily   cyanocobalamin  1,000 mcg Oral Daily   donepezil  5 mg Oral Daily   enoxaparin (LOVENOX) injection  30 mg Subcutaneous Q24H   ferrous sulfate  325 mg Oral Q breakfast   folic acid  1 mg Oral BID   insulin glargine-yfgn  40 Units  Subcutaneous QHS   labetalol  300 mg Oral BID   levothyroxine  175 mcg Oral Daily   linagliptin  5 mg Oral Daily   memantine  5 mg Oral BID   oseltamivir  30 mg Oral Daily   QUEtiapine  25 mg Oral BID   sertraline  50 mg Oral Daily   Continuous Infusions:  sodium chloride 100 mL/hr at 07/20/23 0900   PRN Meds:.acetaminophen **OR** acetaminophen, magnesium hydroxide, ondansetron **OR** ondansetron (ZOFRAN) IV, traZODone Medications Prior to Admission:  Prior to Admission medications   Medication Sig Start Date End Date Taking? Authorizing Provider  acetaminophen (TYLENOL) 325 MG tablet Take 2 tablets (650 mg total) by mouth every 6 (six) hours as needed for mild pain, fever or headache (or Fever >/= 101). 01/24/19   Emokpae, Courage, MD  Aspirin (VAZALORE) 81 MG CAPS Take by mouth. 08/21/19   [provider]  atorvastatin (LIPITOR) 40 MG tablet Take 1 tablet (40 mg total) by mouth every morning. 10/05/20   Sharee Holster, NP  calcitRIOL (ROCALTROL) 0.25 MCG capsule Take 1 capsule (0.25 mcg total) by mouth daily with lunch. 09/22/22   Roma Kayser, MD  cephALEXin (KEFLEX) 500 MG capsule Take 1 capsule (500 mg total) by mouth 4 (four) times daily. 07/15/23   Bethann Berkshire, MD  cholecalciferol (VITAMIN D3) 25 MCG (1000 UNIT) tablet Take 1,000 Units by mouth daily.    [provider]  cyanocobalamin 1000 MCG tablet Take by mouth.    [provider]  donepezil (ARICEPT) 5 MG tablet Take 5 mg by mouth daily.    [provider]  ferrous sulfate 325 (65 FE) MG tablet Take 1 tablet (325 mg total) by mouth daily with breakfast. 10/05/20   Sharee Holster, NP  folic acid (FOLVITE) 1 MG tablet Take 1 mg by mouth 2 (two) times daily.    [provider]  furosemide (LASIX) 40 MG tablet Take 40 mg by mouth 2 (two) times daily. 05/30/23   [provider]  insulin glargine (LANTUS SOLOSTAR) 100 UNIT/ML Solostar Pen Inject 40 Units into the skin at  bedtime. Patient taking  differently: Inject 12 Units into the skin at bedtime. 10/05/20   Sharee Holster, NP  labetalol (NORMODYNE) 300 MG tablet Take 300 mg by mouth 2 (two) times daily.    [provider]  levothyroxine (SYNTHROID) 175 MCG tablet Take 175 mcg by mouth daily.    [provider]  loperamide (IMODIUM A-D) 2 MG tablet Take 2 mg by mouth as needed for diarrhea or loose stools.    [provider]  meloxicam (MOBIC) 15 MG tablet Take 15 mg by mouth daily. 05/23/23   [provider]  memantine (NAMENDA) 5 MG tablet Take 5 mg by mouth 2 (two) times daily.    [provider]  MODERNA COVID-19 BIVAL BOOSTER 50 MCG/0.5ML injection  04/02/21   [provider]  QUEtiapine (SEROQUEL) 25 MG tablet Take 1 tablet (25 mg total) by mouth daily after supper. Patient taking differently: Take 25 mg by mouth 2 (two) times daily. 10/05/20   Sharee Holster, NP  sertraline (ZOLOFT) 50 MG tablet Take 50 mg by mouth daily.    [provider]  TRADJENTA 5 MG TABS tablet Take 5 mg by mouth daily.    [provider]  UNABLE TO FIND Diet - NAS, ConCHO    [provider]   No Known Allergies Review of Systems  Unable to perform ROS: Dementia    Physical Exam Vitals and nursing note reviewed.  Constitutional:      General: She is not in acute distress.    Appearance: She is obese. She is ill-appearing.  Cardiovascular:     Rate and Rhythm: Normal rate.  Pulmonary:     Effort: Pulmonary effort is normal. No tachypnea.  Skin:    General: Skin is warm and dry.     Vital Signs: BP (!) 167/76 (BP Location: Right Arm)   Pulse 64   Temp 98.5 F (36.9 C) (Oral)   Resp 18   Ht 5\' 1"  (1.549 m)   Wt 94.3 kg   SpO2 99%   BMI 39.30 kg/m  Pain Scale: Faces       SpO2: SpO2: 99 % O2 Device:SpO2: 99 % O2 Flow Rate: .O2 Flow Rate (L/min): 2 L/min  IO: Intake/output summary: No intake or output data in the 24 hours  ending 07/20/23 0944  LBM: Last BM Date : 07/19/23 Baseline Weight: Weight: 94.3 kg Most recent weight: Weight: 94.3 kg     Palliative Assessment/Data:     Time In: 1000  Time Out: 1115 Time Total: 75 minutes  Greater than 50%  of this time was spent counseling and coordinating care related to the above assessment and plan.  Signed by: Katheran Awe, NP   Please contact Palliative Medicine Team phone at [phone] for questions and concerns.  For individual provider: See Loretha Stapler

## 2023-07-20 NOTE — ED Notes (Signed)
Pt resting, no complaints at this time.

## 2023-07-20 NOTE — Plan of Care (Signed)
 Patient lethargic and requires multiple stimulation attempts to awaken.  She is oriented to self only.   Problem: Education: Goal: Knowledge of General Education information will improve Description: Including pain rating scale, medication(s)/side effects and non-pharmacologic comfort measures Outcome: Not Progressing   Problem: Health Behavior/Discharge Planning: Goal: Ability to manage health-related needs will improve Outcome: Not Progressing   Problem: Activity: Goal: Risk for activity intolerance will decrease Outcome: Not Progressing   Problem: Nutrition: Goal: Adequate nutrition will be maintained Outcome: Not Progressing     Problem: Clinical Measurements: Goal: Ability to maintain clinical measurements within normal limits will improve Outcome: Progressing Goal: Will remain free from infection Outcome: Progressing Goal: Diagnostic test results will improve Outcome: Progressing Goal: Respiratory complications will improve Outcome: Progressing Goal: Cardiovascular complication will be avoided Outcome: Progressing   Problem: Elimination: Goal: Will not experience complications related to urinary retention Outcome: Progressing   Problem: Safety: Goal: Ability to remain free from injury will improve Outcome: Progressing

## 2023-07-20 NOTE — ED Notes (Addendum)
 Pt brief and chux changed, pericare performed,  purewick placed.  Pt rectal temp re checked at that time, PRN tylenol administered.

## 2023-07-20 NOTE — Progress Notes (Signed)
 PROGRESS NOTE   Courtney [name]  VWU:[id number] DOB: 1945/07/20 DOA: 07/19/2023 PCP: Emiliano Dyer, FNP   Chief Complaint  Patient presents with   Shortness of Breath   Level of care: Telemetry  Brief Admission History:   78 y.o. Caucasian female with medical history significant for Alzheimer's disease with behavioral changes, type 2 diabetes mellitus, with stage IIIb chronic kidney disease, hypertension, depression and dyslipidemia, who presented to the emergency room with acute onset of altered mental status with decreased responsiveness and suspected shortness of breath.  No fever or chills.  No nausea or vomiting or abdominal pain were reported.  No reported chest pain or palpitations.  She has been having congested cough today without wheezing.  No reported fever or chills per her husband but she had fever here in the ER tonight.  She was seen in the ER with decreased responsiveness 4 days ago and had workup including labs and head CT scan that came back unremarkable.  She was given empiric therapy for UTI but a urine sample was not obtained as she refused.   ED Course: When she came to the ER, temperature was 98.5 and later 100.8/38.2 with a BP of 175/76 and respiratory rate of 23 then 25 and pulse continues 95% on room air with a heart rate of 71.  UA showed 0-5 WBCs with 11-20 RBCs and rare bacteria 100 protein and negative nitrite.  VBG showed pH 7.41 and HCO3 of 26.  CBC showed anemia slightly worse than previous levels 4 days ago.  Lactic acid was 1 and BNP was 286 with high sensitive troponin I 14 and later 17.  CMP was remarkable for BUN of 52 and creatinine 1.93 slightly above her previous levels with a blood glucose of 208 and CO2 of 20. EKG as reviewed by me : EKG showed normal sinus rhythm with a rate of 71 with slightly poor R wave progression. Imaging: Portable chest x-ray showed cardiomegaly with no acute cardiopulmonary disease. Noncontrasted head CT scan is currently  pending.   The patient will be admitted to a medical telemetry observation bed for further evaluation and management.   Assessment and Plan:  Influenza A - continue supportive care and tamiflu - 2 blood cultures were drawn.  Essential hypertension - We will continue antihypertensive therapy.  Type 2 diabetes mellitus with chronic kidney disease, with long-term current use of insulin - She will be placed on supplemental coverage with NovoLog. - We will continue basal coverage. - We will continue Tradjenta. CBG (last 3)  Recent Labs    07/20/23 0732  GLUCAP 169*    Dyslipidemia - We will continue statin therapy.  Acute encephalopathy - This is likely secondary to #1. - She had no clear evidence for pneumonia or current UTI.  She has been empirically treated for UTI the last 4 days.  Alzheimer's dementia with behavioral disturbance - We will continue Aricept, Namenda and Seroquel. - We will continue Zoloft for associated depression.  Pt has severe advancing dementia, requesting palliative medicine consultation for goals of care   Hypothyroidism - We will continue Synthroid.    Consultants:  Palliative care  Procedures:   Antimicrobials:    Subjective: Pt nonverbal  Objective: Vitals:   07/20/23 0600 07/20/23 0700 07/20/23 0754 07/20/23 1311  BP: (!) 143/68 (!) 154/71 (!) 167/76 125/61  Pulse: 64 63 64 (!) 55  Resp: (!) 21 17 18 16   Temp:  98.6 F (37 C) 98.5 F (36.9 C) 98.6 F (  37 C)  TempSrc:  Axillary Oral Oral  SpO2: 96% 94% 99% 98%  Weight:      Height:        Intake/Output Summary (Last 24 hours) at 07/20/2023 1632 Last data filed at 07/20/2023 1202 Gross per 24 hour  Intake 832.22 ml  Output --  Net 832.22 ml   Filed Weights   07/20/23 0328  Weight: 94.3 kg   Examination:  General exam: appears acutely and chronically ill, frail, nonverbal.   Respiratory system: shallow breathing bilateral. Cardiovascular system: normal S1 & S2 heard.  No JVD, murmurs, rubs, gallops or clicks. No pedal edema. Gastrointestinal system: Abdomen is nondistended, soft and nontender. No organomegaly or masses felt. Normal bowel sounds heard. Central nervous system: somnolent with advanced dementia. Extremities: Symmetric 5 x 5 power. Skin: No rashes, lesions or ulcers. Psychiatry: UTD  Data Reviewed: I have personally reviewed following labs and imaging studies  CBC: Recent Labs  Lab 07/15/23 1648 07/19/23 2003 07/20/23 0453  WBC 9.0 6.6 5.0  NEUTROABS 6.6 5.1  --   HGB 11.1* 9.7* 9.8*  HCT 32.8* 28.8* 30.0*  MCV 95.6 94.1 94.9  PLT 185 186 181    Basic Metabolic Panel: Recent Labs  Lab 07/15/23 1648 07/19/23 2003 07/20/23 0453  NA 140 136 139  K 4.1 3.9 3.6  CL 103 101 105  CO2 26 20* 23  GLUCOSE 283* 208* 187*  BUN 52* 52* 54*  CREATININE 1.76* 1.93* 1.73*  CALCIUM 9.4 9.0 8.9    CBG: Recent Labs  Lab 07/15/23 1628 07/20/23 0732  GLUCAP 238* 169*    Recent Results (from the past 240 hours)  Resp panel by RT-PCR (RSV, Flu A&B, Covid) Anterior Nasal Swab     Status: Abnormal   Collection Time: 07/19/23  7:54 PM   Specimen: Anterior Nasal Swab  Result Value Ref Range Status   SARS Coronavirus 2 by RT PCR NEGATIVE NEGATIVE Final    Comment: (NOTE) SARS-CoV-2 target nucleic acids are NOT DETECTED.  The SARS-CoV-2 RNA is generally detectable in upper respiratory specimens during the acute phase of infection. The lowest concentration of SARS-CoV-2 viral copies this assay can detect is 138 copies/mL. A negative result does not preclude SARS-Cov-2 infection and should not be used as the sole basis for treatment or other patient management decisions. A negative result may occur with  improper specimen collection/handling, submission of specimen other than nasopharyngeal swab, presence of viral mutation(s) within the areas targeted by this assay, and inadequate number of viral copies(<138 copies/mL). A negative  result must be combined with clinical observations, patient history, and epidemiological information. The expected result is Negative.  Fact Sheet for Patients:  BloggerCourse.com  Fact Sheet for Healthcare Providers:  SeriousBroker.it  This test is no t yet approved or cleared by the [place] FDA and  has been authorized for detection and/or diagnosis of SARS-CoV-2 by FDA under an Emergency Use Authorization (EUA). This EUA will remain  in effect (meaning this test can be used) for the duration of the COVID-19 declaration under Section 564(b)(1) of the Act, 21 U.S.C.section 360bbb-3(b)(1), unless the authorization is terminated  or revoked sooner.       Influenza A by PCR POSITIVE (A) NEGATIVE Final   Influenza B by PCR NEGATIVE NEGATIVE Final    Comment: (NOTE) The Xpert Xpress SARS-CoV-2/FLU/RSV plus assay is intended as an aid in the diagnosis of influenza from Nasopharyngeal swab specimens and should not be used as a sole basis for  treatment. Nasal washings and aspirates are unacceptable for Xpert Xpress SARS-CoV-2/FLU/RSV testing.  Fact Sheet for Patients: BloggerCourse.com  Fact Sheet for Healthcare Providers: SeriousBroker.it  This test is not yet approved or cleared by the [place] FDA and has been authorized for detection and/or diagnosis of SARS-CoV-2 by FDA under an Emergency Use Authorization (EUA). This EUA will remain in effect (meaning this test can be used) for the duration of the COVID-19 declaration under Section 564(b)(1) of the Act, 21 U.S.C. section 360bbb-3(b)(1), unless the authorization is terminated or revoked.     Resp Syncytial Virus by PCR NEGATIVE NEGATIVE Final    Comment: (NOTE) Fact Sheet for Patients: BloggerCourse.com  Fact Sheet for Healthcare  Providers: SeriousBroker.it  This test is not yet approved or cleared by the [place] FDA and has been authorized for detection and/or diagnosis of SARS-CoV-2 by FDA under an Emergency Use Authorization (EUA). This EUA will remain in effect (meaning this test can be used) for the duration of the COVID-19 declaration under Section 564(b)(1) of the Act, 21 U.S.C. section 360bbb-3(b)(1), unless the authorization is terminated or revoked.  Performed at [facility], [address], [place], [place] [address]   Culture, blood (routine x 2)     Status: None (Preliminary result)   Collection Time: 07/19/23  8:15 PM   Specimen: Left Antecubital; Blood  Result Value Ref Range Status   Specimen Description   Final    LEFT ANTECUBITAL BOTTLES DRAWN AEROBIC AND ANAEROBIC   Special Requests Blood Culture adequate volume  Final   Culture   Final    NO GROWTH < 12 HOURS Performed at [facility], [address], [place], [place] [address]    Report Status PENDING  Incomplete  Culture, blood (routine x 2)     Status: None (Preliminary result)   Collection Time: 07/19/23  8:17 PM   Specimen: BLOOD LEFT FOREARM  Result Value Ref Range Status   Specimen Description   Final    BLOOD LEFT FOREARM BOTTLES DRAWN AEROBIC AND ANAEROBIC   Special Requests   Final    Blood Culture results may not be optimal due to an inadequate volume of blood received in culture bottles   Culture   Final    NO GROWTH < 12 HOURS Performed at [facility], [address], [place], [place] [address]    Report Status PENDING  Incomplete     Radiology Studies: ECHOCARDIOGRAM COMPLETE Result Date: 07/20/2023    ECHOCARDIOGRAM REPORT   Patient Name:   Courtney [name] Date of Exam: 07/20/2023 Medical Rec #:  [id number]     Height:       61.0 in Accession #:    [id number]    Weight:       208.0 lb Date of Birth:  May 09, 1946    BSA:          1.921 m Patient Age:    77 years      BP:           125/61  mmHg Patient Gender: F             HR:           61 bpm. [facility] Procedure: 2D Echo, Color Doppler, Cardiac Doppler and Intracardiac            Opacification Agent (Both Spectral and Color Flow Doppler were            utilized during procedure). Indications:  CHF-Acute Diastolic I50.31  History:        Patient has prior history of Echocardiogram examinations, most                 recent 08/21/2019. CHF; Risk Factors:Diabetes and Hypertension.  Sonographer:    Webb Laws Referring Phys: [id number] JAN A MANSY IMPRESSIONS  1. Left ventricular ejection fraction, by estimation, is 60 to 65%. The left ventricle has normal function. The left ventricle has no regional wall motion abnormalities. There is mild concentric left ventricular hypertrophy. Left ventricular diastolic parameters are consistent with Grade II diastolic dysfunction (pseudonormalization).  2. Right ventricular systolic function is normal. The right ventricular size is normal. There is normal pulmonary artery systolic pressure. The estimated right ventricular systolic pressure is 23.1 mmHg.  3. Left atrial size was mildly dilated.  4. The mitral valve is degenerative. Trivial mitral valve regurgitation.  5. The aortic valve is tricuspid. Aortic valve regurgitation is not visualized.  6. The inferior vena cava is normal in size with greater than 50% respiratory variability, suggesting right atrial pressure of 3 mmHg. Comparison(s): Prior images reviewed side by side. LVEF remains normal range at 60-65%. FINDINGS  Left Ventricle: Left ventricular ejection fraction, by estimation, is 60 to 65%. The left ventricle has normal function. The left ventricle has no regional wall motion abnormalities. Definity contrast agent was given IV to delineate the left ventricular  endocardial borders. Strain imaging was not performed. The left ventricular internal cavity size was normal in size. There is mild concentric left ventricular hypertrophy.  Left ventricular diastolic parameters are consistent with Grade II diastolic dysfunction (pseudonormalization). Right Ventricle: The right ventricular size is normal. No increase in right ventricular wall thickness. Right ventricular systolic function is normal. There is normal pulmonary artery systolic pressure. The tricuspid regurgitant velocity is 2.24 m/s, and  with an assumed right atrial pressure of 3 mmHg, the estimated right ventricular systolic pressure is 23.1 mmHg. Left Atrium: Left atrial size was mildly dilated. Right Atrium: Right atrial size was normal in size. Pericardium: Trivial pericardial effusion is present. The pericardial effusion is posterior to the left ventricle. Mitral Valve: The mitral valve is degenerative in appearance. Mild mitral annular calcification. Trivial mitral valve regurgitation. Tricuspid Valve: The tricuspid valve is grossly normal. Tricuspid valve regurgitation is trivial. Aortic Valve: The aortic valve is tricuspid. There is mild aortic valve annular calcification. Aortic valve regurgitation is not visualized. Pulmonic Valve: The pulmonic valve was grossly normal. Pulmonic valve regurgitation is trivial. Aorta: The aortic root and ascending aorta are structurally normal, with no evidence of dilitation. Venous: The inferior vena cava is normal in size with greater than 50% respiratory variability, suggesting right atrial pressure of 3 mmHg. IAS/Shunts: No atrial level shunt detected by color flow Doppler. Additional Comments: 3D imaging was not performed.  LEFT VENTRICLE PLAX 2D LVIDd:         4.60 cm     Diastology LVIDs:         3.10 cm     LV e' medial:    5.00 cm/s LV PW:         1.20 cm     LV E/e' medial:  20.6 LV IVS:        1.30 cm     LV e' lateral:   4.68 cm/s LVOT diam:     1.90 cm     LV E/e' lateral: 22.0 LV SV:         64 LV SV Index:  33 LVOT Area:     2.84 cm  LV Volumes (MOD) LV vol d, MOD A2C: 69.7 ml LV vol d, MOD A4C: 80.8 ml LV vol s, MOD A2C: 23.5 ml  LV vol s, MOD A4C: 29.5 ml LV SV MOD A2C:     46.2 ml LV SV MOD A4C:     80.8 ml LV SV MOD BP:      48.0 ml RIGHT VENTRICLE             IVC RV Basal diam:  3.50 cm     IVC diam: 1.80 cm RV S prime:     12.35 cm/s TAPSE (M-mode): 2.6 cm LEFT ATRIUM           Index        RIGHT ATRIUM          Index LA diam:      3.80 cm 1.98 cm/m   RA Area:     7.07 cm LA Vol (A2C): 32.0 ml 16.66 ml/m  RA Volume:   8.96 ml  4.67 ml/m LA Vol (A4C): 64.4 ml 33.53 ml/m  AORTIC VALVE LVOT Vmax:   110.00 cm/s LVOT Vmean:  69.900 cm/s LVOT VTI:    0.226 m  AORTA Ao Root diam: 3.00 cm Ao Asc diam:  3.50 cm MITRAL VALVE                TRICUSPID VALVE MV Area (PHT): 3.48 cm     TR Peak grad:   20.1 mmHg MV Decel Time: 218 msec     TR Vmax:        224.00 cm/s MV E velocity: 103.00 cm/s MV A velocity: 93.80 cm/s   SHUNTS MV E/A ratio:  1.10         Systemic VTI:  0.23 m                             Systemic Diam: 1.90 cm Nona Dell MD Electronically signed by Nona Dell MD Signature Date/Time: 07/20/2023/3:41:19 PM    Final    CT Head Wo Contrast Result Date: 07/19/2023 CLINICAL DATA:  Mental status change EXAM: CT HEAD WITHOUT CONTRAST TECHNIQUE: Contiguous axial images were obtained from the base of the skull through the vertex without intravenous contrast. RADIATION DOSE REDUCTION: This exam was performed according to the departmental dose-optimization program which includes automated exposure control, adjustment of the mA and/or kV according to patient size and/or use of iterative reconstruction technique. COMPARISON:  Head CT 07/15/2023 FINDINGS: Brain: No evidence of acute infarction, hemorrhage, hydrocephalus, extra-axial collection or mass lesion/mass effect. Again seen is mild diffuse atrophy. There stable mild periventricular white matter hypodensity. There stable old small infarcts in the left basal ganglia. Vascular: Atherosclerotic calcifications are present within the cavernous internal carotid arteries. Skull:  Normal. Negative for fracture or focal lesion. Sinuses/Orbits: No acute finding. Other: None. IMPRESSION: 1. No acute intracranial process. 2. Stable atrophy and chronic small vessel ischemic changes. 3. Stable old small infarcts in the left basal ganglia. Electronically Signed   By: Darliss Cheney M.D.   On: 07/19/2023 23:27   DG Chest Port 1 View Result Date: 07/19/2023 CLINICAL DATA:  Lethargic EXAM: PORTABLE CHEST 1 VIEW COMPARISON:  07/15/2023 FINDINGS: Cardiomegaly. No confluent airspace opacities, effusions or edema. No acute bony abnormality. IMPRESSION: Cardiomegaly.  No active disease. Electronically Signed   By: Charlett Nose M.D.   On: 07/19/2023 20:30  Scheduled Meds:  [START ON 07/21/2023] aspirin EC  81 mg Oral Daily   atorvastatin  40 mg Oral q morning   calcitRIOL  0.25 mcg Oral Q lunch   cholecalciferol  1,000 Units Oral Daily   cyanocobalamin  1,000 mcg Oral Daily   donepezil  5 mg Oral Daily   enoxaparin (LOVENOX) injection  30 mg Subcutaneous Q24H   ferrous sulfate  325 mg Oral Q breakfast   folic acid  1 mg Oral BID   insulin glargine-yfgn  40 Units Subcutaneous QHS   labetalol  300 mg Oral BID   levothyroxine  175 mcg Oral Daily   linagliptin  5 mg Oral Daily   memantine  5 mg Oral BID   oseltamivir  30 mg Oral Daily   QUEtiapine  25 mg Oral BID   sertraline  50 mg Oral Daily   Continuous Infusions:  sodium chloride 100 mL/hr at 07/20/23 1202     LOS: 0 days   Time spent: 37 mins  Judithann Villamar Laural Benes, MD How to contact the [facility] Attending or Consulting provider 7A - 7P or covering provider during after hours 7P -7A, for this patient?  Check the care team in [facility] and look for a) attending/consulting [facility] provider listed and b) the [facility] team listed Log into [email or web address] to find provider on call.  Locate the [facility] provider you are looking for under [facility] and page to a number that you can be directly reached. If you still have difficulty reaching the  provider, please page the [facility] (Director on Call) for the Hospitalists listed on amion for assistance.  07/20/2023, 4:32 PM

## 2023-07-20 NOTE — TOC Initial Note (Signed)
 Transition of Care [facility]) - Initial/Assessment Note    Patient Details  Name: Courtney [name] MRN: [id number] Date of Birth: 1946/05/09  Transition of Care [facility]) CM/SW Contact:    Isabella Bowens, LCSWA Phone Number: 07/20/2023, 2:45 PM  Clinical Narrative:      CSW spoke with Courtney [name] this morning at [facility] regarding patient return back once medically cleared . Courtney [name] shared that pt was on there memory care unit and they assisted with all patient needs . Courtney [name] also shared that pt is not ambulatory and uses a wheelchair to get around . Courtney [name] stated that once she knows medically what is going on with patient, pt can return, if they can manage.    Later on , CSW spoke with patient daughter Courtney [name]- [place] who is at bedside and shared with her the conversation that was had with Courtney [name] this morning; daughter desires for pt to return back with [facility] since her one of her family members has hospice there at [facility]. CSW mentioned to daughter about possibly looking at SNF placement if pt cannot return back to [facility] and daughter stated that she would see how things go once patient is alert and eating. Daughter also mentioned that pt got approved for 10 hours of additional help at [facility] through her medicaid and that Courtney [name] is aware of it.   Daughter is aware that pt medicaid would need to be switched to LTC SNF medicaid.  TOC will continue to follow.    Expected Discharge Plan: Memory Care Barriers to Discharge: Continued Medical Work up   Patient Goals and CMS Choice Patient states their goals for this hospitalization and ongoing recovery are:: return back to [facility] CMS Medicare.gov Compare Post Acute Care list provided to:: Patient Represenative (must comment) (Courtney [name] - Daughter ( POA )) Choice offered to / list presented to : [facility] POA / Guardian Health and safety inspector - daughter)      Expected Discharge Plan and Services In-house Referral: Hospice / Palliative Care, Clinical  Social Work   Post Acute Care Choice: Durable Medical Equipment Living arrangements for the past 2 months:  (memory care facility)                                      Prior Living Arrangements/Services Living arrangements for the past 2 months:  (memory care facility) Lives with:: Facility Resident Patient language and need for interpreter reviewed:: Yes Do you feel safe going back to the place where you live?: Yes      Need for Family Participation in Patient Care: Yes (Comment) Care giver support system in place?: Yes (comment)   Criminal Activity/Legal Involvement Pertinent to Current Situation/Hospitalization: No - Comment as needed  Activities of Daily Living   ADL Screening (condition at time of admission) Independently performs ADLs?: No Does the patient have a NEW difficulty with bathing/dressing/toileting/self-feeding that is expected to last >3 days?: Yes (Initiates electronic notice to provider for possible OT consult) Does the patient have a NEW difficulty with getting in/out of bed, walking, or climbing stairs that is expected to last >3 days?: Yes (Initiates electronic notice to provider for possible PT consult) Does the patient have a NEW difficulty with communication that is expected to last >3 days?: Yes (Initiates electronic notice to provider for possible SLP consult) Is the patient deaf or have difficulty hearing?: No Does the patient have difficulty seeing,  even when wearing glasses/contacts?: No Does the patient have difficulty concentrating, remembering, or making decisions?: Yes  Permission Sought/Granted      Share Information with NAME: Courtney [name]     Permission granted to share info w Relationship: Daughter - POA     Emotional Assessment Appearance:: Appears stated age   Affect (typically observed): Appropriate   Alcohol / Substance Use: Not Applicable Psych Involvement: No (comment)  Admission diagnosis:  Influenza A [J10.1] Patient Active  Problem List   Diagnosis Date Noted   Influenza A 07/19/2023   Acute encephalopathy 07/19/2023   Dyslipidemia 07/19/2023   Type 2 diabetes mellitus with chronic kidney disease, with long-term current use of insulin (HCC) 07/19/2023   Essential hypertension 07/19/2023   Acute on chronic diastolic CHF (congestive heart failure) (HCC) 07/19/2023   Other hyperparathyroidism (HCC) 09/22/2022   Low folic acid 10/02/2020   Foot fracture, right, sequela 10/02/2020   Hypertension associated with stage 3b chronic kidney disease due to type 2 diabetes mellitus (HCC) 09/30/2020   Stage 3 chronic kidney disease due to type 2 diabetes mellitus (HCC) 09/30/2020   Hyperlipidemia associated with type 2 diabetes mellitus (HCC) 09/30/2020   Primary osteoarthritis of knees, bilateral 09/30/2020   Major depression, recurrent, chronic (HCC) 09/30/2020   Psychosis in elderly without behavioral disturbance (HCC) 09/30/2020   Vitamin B12 deficiency 09/30/2020   Bilateral lower extremity edema 09/30/2020   Anemia associated with diabetes mellitus due to underlying condition (HCC) 09/30/2020   History of CVA (cerebrovascular accident) 09/30/2020   Alzheimer's dementia with behavioral disturbance (HCC) 09/30/2020   AKI (acute kidney injury) (HCC) 09/27/2020   CVA (cerebral vascular accident) (HCC) 08/21/2019   Controlled type 2 diabetes mellitus with neurologic complication, with long-term current use of insulin (HCC) 08/21/2019   Hypothyroidism 01/23/2019   HTN (hypertension) 01/23/2019   PCP:  Emiliano Dyer, FNP Pharmacy:   [facility] - [place], [place] - [address] [place] [address] Phone: [phone] Fax: [phone]  [facility] [phone] - [place], [place] - [address] AT [facility] [address] [place] [address] Phone: [phone] Fax: [phone]     Social Drivers of Health (SDOH) Social History: SDOH  Screenings   Food Insecurity: No Food Insecurity (07/20/2023)  Housing: Low Risk  (07/20/2023)  Transportation Needs: No Transportation Needs (07/20/2023)  Utilities: Not At Risk (07/20/2023)  Depression (PHQ2-9): Low Risk  (05/15/2018)  Social Connections: Socially Isolated (07/20/2023)  Tobacco Use: Medium Risk (07/20/2023)   SDOH Interventions:     Readmission Risk Interventions    07/20/2023    2:29 PM  Readmission Risk Prevention Plan  Transportation Screening Complete  Home Care Screening Complete  Medication Review (RN CM) Complete

## 2023-07-21 DIAGNOSIS — G9340 Encephalopathy, unspecified: Secondary | ICD-10-CM | POA: Diagnosis not present

## 2023-07-21 DIAGNOSIS — Z794 Long term (current) use of insulin: Secondary | ICD-10-CM

## 2023-07-21 DIAGNOSIS — E1122 Type 2 diabetes mellitus with diabetic chronic kidney disease: Secondary | ICD-10-CM | POA: Diagnosis not present

## 2023-07-21 DIAGNOSIS — G309 Alzheimer's disease, unspecified: Secondary | ICD-10-CM | POA: Diagnosis not present

## 2023-07-21 DIAGNOSIS — J101 Influenza due to other identified influenza virus with other respiratory manifestations: Secondary | ICD-10-CM | POA: Diagnosis not present

## 2023-07-21 LAB — CBC
HCT: 27.3 % — ABNORMAL LOW (ref 36.0–46.0)
Hemoglobin: 8.9 g/dL — ABNORMAL LOW (ref 12.0–15.0)
MCH: 30.8 pg (ref 26.0–34.0)
MCHC: 32.6 g/dL (ref 30.0–36.0)
MCV: 94.5 fL (ref 80.0–100.0)
Platelets: 179 10*3/uL (ref 150–400)
RBC: 2.89 MIL/uL — ABNORMAL LOW (ref 3.87–5.11)
RDW: 12.6 % (ref 11.5–15.5)
WBC: 4.3 10*3/uL (ref 4.0–10.5)
nRBC: 0 % (ref 0.0–0.2)

## 2023-07-21 LAB — BASIC METABOLIC PANEL
Anion gap: 6 (ref 5–15)
BUN: 49 mg/dL — ABNORMAL HIGH (ref 8–23)
CO2: 24 mmol/L (ref 22–32)
Calcium: 8.2 mg/dL — ABNORMAL LOW (ref 8.9–10.3)
Chloride: 106 mmol/L (ref 98–111)
Creatinine, Ser: 1.51 mg/dL — ABNORMAL HIGH (ref 0.44–1.00)
GFR, Estimated: 35 mL/min — ABNORMAL LOW (ref >60)
Glucose, Bld: 109 mg/dL — ABNORMAL HIGH (ref 70–99)
Potassium: 3.3 mmol/L — ABNORMAL LOW (ref 3.5–5.1)
Sodium: 136 mmol/L (ref 135–145)

## 2023-07-21 LAB — GLUCOSE, CAPILLARY
Glucose-Capillary: 111 mg/dL — ABNORMAL HIGH (ref 70–99)
Glucose-Capillary: 118 mg/dL — ABNORMAL HIGH (ref 70–99)
Glucose-Capillary: 174 mg/dL — ABNORMAL HIGH (ref 70–99)
Glucose-Capillary: 169 mg/dL — ABNORMAL HIGH (ref 70–99)

## 2023-07-21 LAB — MAGNESIUM: Magnesium: 1.8 mg/dL (ref 1.7–2.4)

## 2023-07-21 MED ORDER — LANTUS SOLOSTAR 100 UNIT/ML ~~LOC~~ SOPN: 12.0000 [IU] | PEN_INJECTOR | Freq: Every day | SUBCUTANEOUS | Status: AC

## 2023-07-21 MED ORDER — OSELTAMIVIR PHOSPHATE 30 MG PO CAPS: 30.0000 mg | ORAL_CAPSULE | Freq: Every day | ORAL | 0 refills | Status: DC

## 2023-07-21 MED ORDER — QUETIAPINE FUMARATE 25 MG PO TABS: 25.0000 mg | ORAL_TABLET | Freq: Two times a day (BID) | ORAL | Status: AC

## 2023-07-21 MED ORDER — OSELTAMIVIR PHOSPHATE 30 MG PO CAPS: 30.0000 mg | ORAL_CAPSULE | Freq: Every day | ORAL | 0 refills | Status: AC

## 2023-07-21 MED ORDER — FUROSEMIDE 40 MG PO TABS: 40.0000 mg | ORAL_TABLET | ORAL | Status: AC

## 2023-07-21 NOTE — Discharge Summary (Addendum)
 Physician Discharge Summary  Courtney [name] GEX:[id number] DOB: 04/06/1946 DOA: 07/19/2023  PCP: Emiliano Dyer, FNP  Admit date: 07/19/2023 Discharge date: 07/21/2023  Admitted From:  [facility]F Memory Care Disposition: ALF Memory Care   Recommendations for Outpatient Follow-up:  Recommend palliative care consultation for hospice services  The recommended isolation period for a nursing home resident with influenza is 5 days after the onset of symptoms which would end on 07/25/23   Discharge Condition: STABLE   CODE STATUS: DNR DIET:  heart healthy    Brief Hospitalization Summary: Please see all hospital notes, images, labs for full details of the hospitalization. Admission provider HPI:  78 y.o. Caucasian female with medical history significant for Alzheimer's disease with behavioral changes, type 2 diabetes mellitus, with stage IIIb chronic kidney disease, hypertension, depression and dyslipidemia, who presented to the emergency room with acute onset of altered mental status with decreased responsiveness and suspected shortness of breath.  No fever or chills.  No nausea or vomiting or abdominal pain were reported.  No reported chest pain or palpitations.  She has been having congested cough today without wheezing.  No reported fever or chills per her husband but she had fever here in the ER tonight.  She was seen in the ER with decreased responsiveness 4 days ago and had workup including labs and head CT scan that came back unremarkable.  She was given empiric therapy for UTI but a urine sample was not obtained as she refused.   ED Course: When she came to the ER, temperature was 98.5 and later 100.8/38.2 with a BP of 175/76 and respiratory rate of 23 then 25 and pulse continues 95% on room air with a heart rate of 71.  UA showed 0-5 WBCs with 11-20 RBCs and rare bacteria 100 protein and negative nitrite.  VBG showed pH 7.41 and HCO3 of 26.  CBC showed anemia slightly worse than previous levels 4  days ago.  Lactic acid was 1 and BNP was 286 with high sensitive troponin I 14 and later 17.  CMP was remarkable for BUN of 52 and creatinine 1.93 slightly above her previous levels with a blood glucose of 208 and CO2 of 20. EKG as reviewed by me : EKG showed normal sinus rhythm with a rate of 71 with slightly poor R wave progression. Imaging: Portable chest x-ray showed cardiomegaly with no acute cardiopulmonary disease. Noncontrasted head CT scan is currently pending.   The patient will be admitted to a medical telemetry observation bed for further evaluation and management.   Assessment and Plan:   Influenza A - continue supportive care and tamiflu - 2 blood cultures were drawn.   Essential hypertension - We will continue antihypertensive therapy. We have reduced lasix to every other day and added instructions to HOLD IF NOT EATING/DRINKING WELL.     Type 2 diabetes mellitus with chronic kidney disease, with long-term current use of insulin - continue basal coverage. - We will continue Tradjenta. CBG (last 3)  Recent Labs    07/21/23 0057 07/21/23 0756 07/21/23 1119  GLUCAP 111* 118* 174*   Dyslipidemia -  continue statin therapy.   Acute encephalopathy - RESOLVED  - This is likely secondary to #1. - She had no clear evidence for pneumonia or current UTI.  She has been empirically treated for UTI the last 4 days. -- likely was due to influenza infection....confirmed with husband at bedside that she has returned to her baseline and she has been eating and  drinking, taking meds, smiling   Alzheimer's dementia with behavioral disturbance - We will continue Aricept, Namenda and Seroquel. - We will continue Zoloft for associated depression.  Pt has severe advancing dementia, requesting palliative medicine consultation for goals of care - Pt is now DNR. I asked TOC to offer patient's husband outpatient palliative care services.    Hypothyroidism -  continue  Synthroid.  Discharge Diagnoses:  Principal Problem:   Influenza A Active Problems:   Hypothyroidism   Alzheimer's dementia with behavioral disturbance (HCC)   Acute encephalopathy   Dyslipidemia   Type 2 diabetes mellitus with chronic kidney disease, with long-term current use of insulin (HCC)   Essential hypertension   Acute on chronic diastolic CHF (congestive heart failure) (HCC)  Discharge Instructions:  Allergies as of 07/21/2023   No Known Allergies      Medication List     STOP taking these medications    cephALEXin 500 MG capsule Commonly known as: KEFLEX       TAKE these medications    acetaminophen 325 MG tablet Commonly known as: TYLENOL Take 2 tablets (650 mg total) by mouth every 6 (six) hours as needed for mild pain, fever or headache (or Fever >/= 101).   atorvastatin 40 MG tablet Commonly known as: LIPITOR Take 1 tablet (40 mg total) by mouth every morning.   calcitRIOL 0.25 MCG capsule Commonly known as: ROCALTROL Take 1 capsule (0.25 mcg total) by mouth daily with lunch.   cholecalciferol 25 MCG (1000 UNIT) tablet Commonly known as: VITAMIN D3 Take 1,000 Units by mouth daily.   donepezil 5 MG tablet Commonly known as: ARICEPT Take 5 mg by mouth daily.   ferrous sulfate 325 (65 FE) MG tablet Take 1 tablet (325 mg total) by mouth daily with breakfast. What changed: when to take this   folic acid 1 MG tablet Commonly known as: FOLVITE Take 1 mg by mouth 2 (two) times daily.   furosemide 40 MG tablet Commonly known as: LASIX Take 1 tablet (40 mg total) by mouth every other day. HOLD IF NOT EATING OR DRINKING WELL. Start taking on: July 24, 2023 What changed:  when to take this additional instructions These instructions start on July 24, 2023. If you are unsure what to do until then, ask your doctor or other care provider.   labetalol 300 MG tablet Commonly known as: NORMODYNE Take 300 mg by mouth 2 (two) times daily.   Lantus  SoloStar 100 UNIT/ML Solostar Pen Generic drug: insulin glargine Inject 12 Units into the skin at bedtime.   levothyroxine 175 MCG tablet Commonly known as: SYNTHROID Take 175 mcg by mouth daily.   loperamide 2 MG tablet Commonly known as: IMODIUM A-D Take 2 mg by mouth as needed for diarrhea or loose stools.   memantine 5 MG tablet Commonly known as: NAMENDA Take 5 mg by mouth 2 (two) times daily.   oseltamivir 30 MG capsule Commonly known as: TAMIFLU Take 1 capsule (30 mg total) by mouth daily for 3 days. Start taking on: July 22, 2023   polyethylene glycol powder 17 GM/SCOOP powder Commonly known as: GLYCOLAX/MIRALAX Take 1 Container by mouth daily. Mix 1 capful with 4-8 ounces of water and drink by mouth once daily for constipation   QUEtiapine 25 MG tablet Commonly known as: SEROQUEL Take 1 tablet (25 mg total) by mouth 2 (two) times daily.   sertraline 50 MG tablet Commonly known as: ZOLOFT Take 50 mg by mouth daily.   Tradjenta  5 MG Tabs tablet Generic drug: linagliptin Take 5 mg by mouth daily.   UNABLE TO FIND Diet - NAS, ConCHO   Vazalore 81 MG Caps Generic drug: Aspirin Take by mouth.        No Known Allergies Allergies as of 07/21/2023   No Known Allergies      Medication List     STOP taking these medications    cephALEXin 500 MG capsule Commonly known as: KEFLEX       TAKE these medications    acetaminophen 325 MG tablet Commonly known as: TYLENOL Take 2 tablets (650 mg total) by mouth every 6 (six) hours as needed for mild pain, fever or headache (or Fever >/= 101).   atorvastatin 40 MG tablet Commonly known as: LIPITOR Take 1 tablet (40 mg total) by mouth every morning.   calcitRIOL 0.25 MCG capsule Commonly known as: ROCALTROL Take 1 capsule (0.25 mcg total) by mouth daily with lunch.   cholecalciferol 25 MCG (1000 UNIT) tablet Commonly known as: VITAMIN D3 Take 1,000 Units by mouth daily.   donepezil 5 MG  tablet Commonly known as: ARICEPT Take 5 mg by mouth daily.   ferrous sulfate 325 (65 FE) MG tablet Take 1 tablet (325 mg total) by mouth daily with breakfast. What changed: when to take this   folic acid 1 MG tablet Commonly known as: FOLVITE Take 1 mg by mouth 2 (two) times daily.   furosemide 40 MG tablet Commonly known as: LASIX Take 1 tablet (40 mg total) by mouth every other day. HOLD IF NOT EATING OR DRINKING WELL. Start taking on: July 24, 2023 What changed:  when to take this additional instructions These instructions start on July 24, 2023. If you are unsure what to do until then, ask your doctor or other care provider.   labetalol 300 MG tablet Commonly known as: NORMODYNE Take 300 mg by mouth 2 (two) times daily.   Lantus SoloStar 100 UNIT/ML Solostar Pen Generic drug: insulin glargine Inject 12 Units into the skin at bedtime.   levothyroxine 175 MCG tablet Commonly known as: SYNTHROID Take 175 mcg by mouth daily.   loperamide 2 MG tablet Commonly known as: IMODIUM A-D Take 2 mg by mouth as needed for diarrhea or loose stools.   memantine 5 MG tablet Commonly known as: NAMENDA Take 5 mg by mouth 2 (two) times daily.   oseltamivir 30 MG capsule Commonly known as: TAMIFLU Take 1 capsule (30 mg total) by mouth daily for 3 days. Start taking on: July 22, 2023   polyethylene glycol powder 17 GM/SCOOP powder Commonly known as: GLYCOLAX/MIRALAX Take 1 Container by mouth daily. Mix 1 capful with 4-8 ounces of water and drink by mouth once daily for constipation   QUEtiapine 25 MG tablet Commonly known as: SEROQUEL Take 1 tablet (25 mg total) by mouth 2 (two) times daily.   sertraline 50 MG tablet Commonly known as: ZOLOFT Take 50 mg by mouth daily.   Tradjenta 5 MG Tabs tablet Generic drug: linagliptin Take 5 mg by mouth daily.   UNABLE TO FIND Diet - NAS, ConCHO   Vazalore 81 MG Caps Generic drug: Aspirin Take by mouth.         Procedures/Studies: ECHOCARDIOGRAM COMPLETE Result Date: 07/20/2023    ECHOCARDIOGRAM REPORT   Patient Name:   ONELIA CADMUS Date of Exam: 07/20/2023 Medical Rec #:  [id number]     Height:       61.0 in Accession #:  [id number]    Weight:       208.0 lb Date of Birth:  1945-09-13    BSA:          1.921 m Patient Age:    22 years      BP:           125/61 mmHg Patient Gender: F             HR:           61 bpm. [facility] Procedure: 2D Echo, Color Doppler, Cardiac Doppler and Intracardiac            Opacification Agent (Both Spectral and Color Flow Doppler were            utilized during procedure). Indications:    CHF-Acute Diastolic I50.31  History:        Patient has prior history of Echocardiogram examinations, most                 recent 08/21/2019. CHF; Risk Factors:Diabetes and Hypertension.  Sonographer:    Webb Laws Referring Phys: [id number] JAN A MANSY IMPRESSIONS  1. Left ventricular ejection fraction, by estimation, is 60 to 65%. The left ventricle has normal function. The left ventricle has no regional wall motion abnormalities. There is mild concentric left ventricular hypertrophy. Left ventricular diastolic parameters are consistent with Grade II diastolic dysfunction (pseudonormalization).  2. Right ventricular systolic function is normal. The right ventricular size is normal. There is normal pulmonary artery systolic pressure. The estimated right ventricular systolic pressure is 23.1 mmHg.  3. Left atrial size was mildly dilated.  4. The mitral valve is degenerative. Trivial mitral valve regurgitation.  5. The aortic valve is tricuspid. Aortic valve regurgitation is not visualized.  6. The inferior vena cava is normal in size with greater than 50% respiratory variability, suggesting right atrial pressure of 3 mmHg. Comparison(s): Prior images reviewed side by side. LVEF remains normal range at 60-65%. FINDINGS  Left Ventricle: Left ventricular ejection fraction, by  estimation, is 60 to 65%. The left ventricle has normal function. The left ventricle has no regional wall motion abnormalities. Definity contrast agent was given IV to delineate the left ventricular  endocardial borders. Strain imaging was not performed. The left ventricular internal cavity size was normal in size. There is mild concentric left ventricular hypertrophy. Left ventricular diastolic parameters are consistent with Grade II diastolic dysfunction (pseudonormalization). Right Ventricle: The right ventricular size is normal. No increase in right ventricular wall thickness. Right ventricular systolic function is normal. There is normal pulmonary artery systolic pressure. The tricuspid regurgitant velocity is 2.24 m/s, and  with an assumed right atrial pressure of 3 mmHg, the estimated right ventricular systolic pressure is 23.1 mmHg. Left Atrium: Left atrial size was mildly dilated. Right Atrium: Right atrial size was normal in size. Pericardium: Trivial pericardial effusion is present. The pericardial effusion is posterior to the left ventricle. Mitral Valve: The mitral valve is degenerative in appearance. Mild mitral annular calcification. Trivial mitral valve regurgitation. Tricuspid Valve: The tricuspid valve is grossly normal. Tricuspid valve regurgitation is trivial. Aortic Valve: The aortic valve is tricuspid. There is mild aortic valve annular calcification. Aortic valve regurgitation is not visualized. Pulmonic Valve: The pulmonic valve was grossly normal. Pulmonic valve regurgitation is trivial. Aorta: The aortic root and ascending aorta are structurally normal, with no evidence of dilitation. Venous: The inferior vena cava is normal in size with greater than 50% respiratory variability, suggesting right atrial  pressure of 3 mmHg. IAS/Shunts: No atrial level shunt detected by color flow Doppler. Additional Comments: 3D imaging was not performed.  LEFT VENTRICLE PLAX 2D LVIDd:         4.60 cm      Diastology LVIDs:         3.10 cm     LV e' medial:    5.00 cm/s LV PW:         1.20 cm     LV E/e' medial:  20.6 LV IVS:        1.30 cm     LV e' lateral:   4.68 cm/s LVOT diam:     1.90 cm     LV E/e' lateral: 22.0 LV SV:         64 LV SV Index:   33 LVOT Area:     2.84 cm  LV Volumes (MOD) LV vol d, MOD A2C: 69.7 ml LV vol d, MOD A4C: 80.8 ml LV vol s, MOD A2C: 23.5 ml LV vol s, MOD A4C: 29.5 ml LV SV MOD A2C:     46.2 ml LV SV MOD A4C:     80.8 ml LV SV MOD BP:      48.0 ml RIGHT VENTRICLE             IVC RV Basal diam:  3.50 cm     IVC diam: 1.80 cm RV S prime:     12.35 cm/s TAPSE (M-mode): 2.6 cm LEFT ATRIUM           Index        RIGHT ATRIUM          Index LA diam:      3.80 cm 1.98 cm/m   RA Area:     7.07 cm LA Vol (A2C): 32.0 ml 16.66 ml/m  RA Volume:   8.96 ml  4.67 ml/m LA Vol (A4C): 64.4 ml 33.53 ml/m  AORTIC VALVE LVOT Vmax:   110.00 cm/s LVOT Vmean:  69.900 cm/s LVOT VTI:    0.226 m  AORTA Ao Root diam: 3.00 cm Ao Asc diam:  3.50 cm MITRAL VALVE                TRICUSPID VALVE MV Area (PHT): 3.48 cm     TR Peak grad:   20.1 mmHg MV Decel Time: 218 msec     TR Vmax:        224.00 cm/s MV E velocity: 103.00 cm/s MV A velocity: 93.80 cm/s   SHUNTS MV E/A ratio:  1.10         Systemic VTI:  0.23 m                             Systemic Diam: 1.90 cm Nona Dell MD Electronically signed by Nona Dell MD Signature Date/Time: 07/20/2023/3:41:19 PM    Final    CT Head Wo Contrast Result Date: 07/19/2023 CLINICAL DATA:  Mental status change EXAM: CT HEAD WITHOUT CONTRAST TECHNIQUE: Contiguous axial images were obtained from the base of the skull through the vertex without intravenous contrast. RADIATION DOSE REDUCTION: This exam was performed according to the departmental dose-optimization program which includes automated exposure control, adjustment of the mA and/or kV according to patient size and/or use of iterative reconstruction technique. COMPARISON:  Head CT 07/15/2023 FINDINGS: Brain:  No evidence of acute infarction, hemorrhage, hydrocephalus, extra-axial collection or mass lesion/mass effect. Again seen is mild diffuse  atrophy. There stable mild periventricular white matter hypodensity. There stable old small infarcts in the left basal ganglia. Vascular: Atherosclerotic calcifications are present within the cavernous internal carotid arteries. Skull: Normal. Negative for fracture or focal lesion. Sinuses/Orbits: No acute finding. Other: None. IMPRESSION: 1. No acute intracranial process. 2. Stable atrophy and chronic small vessel ischemic changes. 3. Stable old small infarcts in the left basal ganglia. Electronically Signed   By: Darliss Cheney M.D.   On: 07/19/2023 23:27   DG Chest Port 1 View Result Date: 07/19/2023 CLINICAL DATA:  Lethargic EXAM: PORTABLE CHEST 1 VIEW COMPARISON:  07/15/2023 FINDINGS: Cardiomegaly. No confluent airspace opacities, effusions or edema. No acute bony abnormality. IMPRESSION: Cardiomegaly.  No active disease. Electronically Signed   By: Charlett Nose M.D.   On: 07/19/2023 20:30   DG Chest Port 1 View Result Date: 07/15/2023 CLINICAL DATA:  Altered mental status EXAM: PORTABLE CHEST 1 VIEW COMPARISON:  06/21/2021 FINDINGS: Heart size is upper limits of normal, likely accentuated by technique. Aortic atherosclerosis. Low lung volumes. No focal airspace consolidation, pleural effusion, or pneumothorax. Degenerative changes of both shoulders. IMPRESSION: No active disease. Electronically Signed   By: Duanne Guess D.O.   On: 07/15/2023 17:34   CT Head Wo Contrast Result Date: 07/15/2023 CLINICAL DATA:  Delirium EXAM: CT HEAD WITHOUT CONTRAST TECHNIQUE: Contiguous axial images were obtained from the base of the skull through the vertex without intravenous contrast. RADIATION DOSE REDUCTION: This exam was performed according to the departmental dose-optimization program which includes automated exposure control, adjustment of the mA and/or kV according to  patient size and/or use of iterative reconstruction technique. COMPARISON:  Head CT 06/21/21, Brain MR 09/28/20 FINDINGS: Brain: No evidence of acute infarction, hemorrhage, hydrocephalus, extra-axial collection or mass lesion/mass effect. Generalized volume loss without lobar predominance. There is a background of mild chronic microvascular ischemic change. Vascular: No hyperdense vessel or unexpected calcification. Skull: Normal. Negative for fracture or focal lesion. Sinuses/Orbits: No middle ear or mastoid effusion. Paranasal sinuses are notable polypoid mucosal thickening in the bilateral maxillary sinuses. Orbits are unremarkable. Other: None. IMPRESSION: No hemorrhage or CT evidence of an acute cortical infarct Findings were discussed with Dr. Estell Harpin on 07/15/23 at 5:11 PM. Electronically Signed   By: Lorenza Cambridge M.D.   On: 07/15/2023 17:12     Subjective: Pt is awake, alert, she is being fed by husband and she is eating and drinking well and smiling and verbalizing.  She recognizes her husband. No specific complaints.    Discharge Exam: Vitals:   07/20/23 1600 07/20/23 1900  BP: 107/74 (!) 136/57  Pulse: 66   Resp: 17 18  Temp: 98.4 F (36.9 C)   SpO2: 94%    Vitals:   07/20/23 0754 07/20/23 1311 07/20/23 1600 07/20/23 1900  BP: (!) 167/76 125/61 107/74 (!) 136/57  Pulse: 64 (!) 55 66   Resp: 18 16 17 18   Temp: 98.5 F (36.9 C) 98.6 F (37 C) 98.4 F (36.9 C)   TempSrc: Oral Oral Oral   SpO2: 99% 98% 94%   Weight:      Height:       General: Pt is alert, awake, not in acute distress, MMM Cardiovascular: normal S1/S2 +, no rubs, no gallops Respiratory: CTA bilaterally, no wheezing, no rhonchi Abdominal: Soft, NT, ND, bowel sounds + Extremities: no edema, no cyanosis   The results of significant diagnostics from this hospitalization (including imaging, microbiology, ancillary and laboratory) are listed below for reference.  Microbiology: Recent Results (from the past  240 hours)  Resp panel by RT-PCR (RSV, Flu A&B, Covid) Anterior Nasal Swab     Status: Abnormal   Collection Time: 07/19/23  7:54 PM   Specimen: Anterior Nasal Swab  Result Value Ref Range Status   SARS Coronavirus 2 by RT PCR NEGATIVE NEGATIVE Final    Comment: (NOTE) SARS-CoV-2 target nucleic acids are NOT DETECTED.  The SARS-CoV-2 RNA is generally detectable in upper respiratory specimens during the acute phase of infection. The lowest concentration of SARS-CoV-2 viral copies this assay can detect is 138 copies/mL. A negative result does not preclude SARS-Cov-2 infection and should not be used as the sole basis for treatment or other patient management decisions. A negative result may occur with  improper specimen collection/handling, submission of specimen other than nasopharyngeal swab, presence of viral mutation(s) within the areas targeted by this assay, and inadequate number of viral copies(<138 copies/mL). A negative result must be combined with clinical observations, patient history, and epidemiological information. The expected result is Negative.  Fact Sheet for Patients:  BloggerCourse.com  Fact Sheet for Healthcare Providers:  SeriousBroker.it  This test is no t yet approved or cleared by the [place] FDA and  has been authorized for detection and/or diagnosis of SARS-CoV-2 by FDA under an Emergency Use Authorization (EUA). This EUA will remain  in effect (meaning this test can be used) for the duration of the COVID-19 declaration under Section 564(b)(1) of the Act, 21 U.S.C.section 360bbb-3(b)(1), unless the authorization is terminated  or revoked sooner.       Influenza A by PCR POSITIVE (A) NEGATIVE Final   Influenza B by PCR NEGATIVE NEGATIVE Final    Comment: (NOTE) The Xpert Xpress SARS-CoV-2/FLU/RSV plus assay is intended as an aid in the diagnosis of influenza from Nasopharyngeal swab specimens  and should not be used as a sole basis for treatment. Nasal washings and aspirates are unacceptable for Xpert Xpress SARS-CoV-2/FLU/RSV testing.  Fact Sheet for Patients: BloggerCourse.com  Fact Sheet for Healthcare Providers: SeriousBroker.it  This test is not yet approved or cleared by the [place] FDA and has been authorized for detection and/or diagnosis of SARS-CoV-2 by FDA under an Emergency Use Authorization (EUA). This EUA will remain in effect (meaning this test can be used) for the duration of the COVID-19 declaration under Section 564(b)(1) of the Act, 21 U.S.C. section 360bbb-3(b)(1), unless the authorization is terminated or revoked.     Resp Syncytial Virus by PCR NEGATIVE NEGATIVE Final    Comment: (NOTE) Fact Sheet for Patients: BloggerCourse.com  Fact Sheet for Healthcare Providers: SeriousBroker.it  This test is not yet approved or cleared by the [place] FDA and has been authorized for detection and/or diagnosis of SARS-CoV-2 by FDA under an Emergency Use Authorization (EUA). This EUA will remain in effect (meaning this test can be used) for the duration of the COVID-19 declaration under Section 564(b)(1) of the Act, 21 U.S.C. section 360bbb-3(b)(1), unless the authorization is terminated or revoked.  Performed at [facility], [address], [place], [place] [address]   Culture, blood (routine x 2)     Status: None (Preliminary result)   Collection Time: 07/19/23  8:15 PM   Specimen: Left Antecubital; Blood  Result Value Ref Range Status   Specimen Description   Final    LEFT ANTECUBITAL BOTTLES DRAWN AEROBIC AND ANAEROBIC   Special Requests Blood Culture adequate volume  Final   Culture   Final    NO GROWTH  2 DAYS Performed at [facility], [address], [place], [place] [address]    Report Status PENDING  Incomplete  Culture, blood  (routine x 2)     Status: None (Preliminary result)   Collection Time: 07/19/23  8:17 PM   Specimen: BLOOD LEFT FOREARM  Result Value Ref Range Status   Specimen Description   Final    BLOOD LEFT FOREARM BOTTLES DRAWN AEROBIC AND ANAEROBIC   Special Requests   Final    Blood Culture results may not be optimal due to an inadequate volume of blood received in culture bottles   Culture   Final    NO GROWTH 2 DAYS Performed at [facility], [address], [place], [place] [address]    Report Status PENDING  Incomplete     Labs: BNP (last 3 results) Recent Labs    07/19/23 2004  BNP 286.0*   Basic Metabolic Panel: Recent Labs  Lab 07/15/23 1648 07/19/23 2003 07/20/23 0453 07/21/23 0444  NA 140 136 139 136  K 4.1 3.9 3.6 3.3*  CL 103 101 105 106  CO2 26 20* 23 24  GLUCOSE 283* 208* 187* 109*  BUN 52* 52* 54* 49*  CREATININE 1.76* 1.93* 1.73* 1.51*  CALCIUM 9.4 9.0 8.9 8.2*  MG  --   --   --  1.8   Liver Function Tests: Recent Labs  Lab 07/15/23 1648 07/19/23 2003  AST 15 16  ALT 16 15  ALKPHOS 69 67  BILITOT 0.5 0.4  PROT 6.9 6.6  ALBUMIN 3.5 3.2*   No results for input(s): "LIPASE", "AMYLASE" in the last 168 hours. No results for input(s): "AMMONIA" in the last 168 hours. CBC: Recent Labs  Lab 07/15/23 1648 07/19/23 2003 07/20/23 0453 07/21/23 0444  WBC 9.0 6.6 5.0 4.3  NEUTROABS 6.6 5.1  --   --   HGB 11.1* 9.7* 9.8* 8.9*  HCT 32.8* 28.8* 30.0* 27.3*  MCV 95.6 94.1 94.9 94.5  PLT 185 186 181 179   Cardiac Enzymes: No results for input(s): "CKTOTAL", "CKMB", "CKMBINDEX", "TROPONINI" in the last 168 hours. BNP: Invalid input(s): "POCBNP" CBG: Recent Labs  Lab 07/20/23 1713 07/20/23 2247 07/21/23 0057 07/21/23 0756 07/21/23 1119  GLUCAP 135* 126* 111* 118* 174*   D-Dimer No results for input(s): "DDIMER" in the last 72 hours. Hgb A1c No results for input(s): "HGBA1C" in the last 72 hours. Lipid Profile No results for input(s): "CHOL",  "HDL", "LDLCALC", "TRIG", "CHOLHDL", "LDLDIRECT" in the last 72 hours. Thyroid function studies No results for input(s): "TSH", "T4TOTAL", "T3FREE", "THYROIDAB" in the last 72 hours.  Invalid input(s): "FREET3" Anemia work up No results for input(s): "VITAMINB12", "FOLATE", "FERRITIN", "TIBC", "IRON", "RETICCTPCT" in the last 72 hours. Urinalysis    Component Value Date/Time   COLORURINE YELLOW 07/19/2023 2030   APPEARANCEUR HAZY (A) 07/19/2023 2030   LABSPEC 1.012 07/19/2023 2030   PHURINE 5.0 07/19/2023 2030   GLUCOSEU NEGATIVE 07/19/2023 2030   HGBUR MODERATE (A) 07/19/2023 2030   BILIRUBINUR NEGATIVE 07/19/2023 2030   KETONESUR NEGATIVE 07/19/2023 2030   PROTEINUR 100 (A) 07/19/2023 2030   UROBILINOGEN 0.2 04/24/2014 1406   NITRITE NEGATIVE 07/19/2023 2030   LEUKOCYTESUR NEGATIVE 07/19/2023 2030   Sepsis Labs Recent Labs  Lab 07/15/23 1648 07/19/23 2003 07/20/23 0453 07/21/23 0444  WBC 9.0 6.6 5.0 4.3   Microbiology Recent Results (from the past 240 hours)  Resp panel by RT-PCR (RSV, Flu A&B, Covid) Anterior Nasal Swab     Status: Abnormal  Collection Time: 07/19/23  7:54 PM   Specimen: Anterior Nasal Swab  Result Value Ref Range Status   SARS Coronavirus 2 by RT PCR NEGATIVE NEGATIVE Final    Comment: (NOTE) SARS-CoV-2 target nucleic acids are NOT DETECTED.  The SARS-CoV-2 RNA is generally detectable in upper respiratory specimens during the acute phase of infection. The lowest concentration of SARS-CoV-2 viral copies this assay can detect is 138 copies/mL. A negative result does not preclude SARS-Cov-2 infection and should not be used as the sole basis for treatment or other patient management decisions. A negative result may occur with  improper specimen collection/handling, submission of specimen other than nasopharyngeal swab, presence of viral mutation(s) within the areas targeted by this assay, and inadequate number of viral copies(<138 copies/mL). A  negative result must be combined with clinical observations, patient history, and epidemiological information. The expected result is Negative.  Fact Sheet for Patients:  BloggerCourse.com  Fact Sheet for Healthcare Providers:  SeriousBroker.it  This test is no t yet approved or cleared by the [place] FDA and  has been authorized for detection and/or diagnosis of SARS-CoV-2 by FDA under an Emergency Use Authorization (EUA). This EUA will remain  in effect (meaning this test can be used) for the duration of the COVID-19 declaration under Section 564(b)(1) of the Act, 21 U.S.C.section 360bbb-3(b)(1), unless the authorization is terminated  or revoked sooner.       Influenza A by PCR POSITIVE (A) NEGATIVE Final   Influenza B by PCR NEGATIVE NEGATIVE Final    Comment: (NOTE) The Xpert Xpress SARS-CoV-2/FLU/RSV plus assay is intended as an aid in the diagnosis of influenza from Nasopharyngeal swab specimens and should not be used as a sole basis for treatment. Nasal washings and aspirates are unacceptable for Xpert Xpress SARS-CoV-2/FLU/RSV testing.  Fact Sheet for Patients: BloggerCourse.com  Fact Sheet for Healthcare Providers: SeriousBroker.it  This test is not yet approved or cleared by the [place] FDA and has been authorized for detection and/or diagnosis of SARS-CoV-2 by FDA under an Emergency Use Authorization (EUA). This EUA will remain in effect (meaning this test can be used) for the duration of the COVID-19 declaration under Section 564(b)(1) of the Act, 21 U.S.C. section 360bbb-3(b)(1), unless the authorization is terminated or revoked.     Resp Syncytial Virus by PCR NEGATIVE NEGATIVE Final    Comment: (NOTE) Fact Sheet for Patients: BloggerCourse.com  Fact Sheet for Healthcare  Providers: SeriousBroker.it  This test is not yet approved or cleared by the [place] FDA and has been authorized for detection and/or diagnosis of SARS-CoV-2 by FDA under an Emergency Use Authorization (EUA). This EUA will remain in effect (meaning this test can be used) for the duration of the COVID-19 declaration under Section 564(b)(1) of the Act, 21 U.S.C. section 360bbb-3(b)(1), unless the authorization is terminated or revoked.  Performed at [facility], [address], [place], [place] [address]   Culture, blood (routine x 2)     Status: None (Preliminary result)   Collection Time: 07/19/23  8:15 PM   Specimen: Left Antecubital; Blood  Result Value Ref Range Status   Specimen Description   Final    LEFT ANTECUBITAL BOTTLES DRAWN AEROBIC AND ANAEROBIC   Special Requests Blood Culture adequate volume  Final   Culture   Final    NO GROWTH 2 DAYS Performed at [facility], [address] [place], [place] [address]    Report Status PENDING  Incomplete  Culture, blood (routine x 2)  Status: None (Preliminary result)   Collection Time: 07/19/23  8:17 PM   Specimen: BLOOD LEFT FOREARM  Result Value Ref Range Status   Specimen Description   Final    BLOOD LEFT FOREARM BOTTLES DRAWN AEROBIC AND ANAEROBIC   Special Requests   Final    Blood Culture results may not be optimal due to an inadequate volume of blood received in culture bottles   Culture   Final    NO GROWTH 2 DAYS Performed at [facility], [address], [place], [place] [address]    Report Status PENDING  Incomplete   Time coordinating discharge: 38 mins   SIGNED:  Standley Dakins, MD  [facility] 07/21/2023, 1:34 PM How to contact the [facility] Attending or Consulting provider 7A - 7P or covering provider during after hours 7P -7A, for this patient?  Check the care team in [facility] and look for a) attending/consulting TRH provider listed and b) the [facility] team listed Log into  [email or web address] and use [place]'s universal password to access. If you do not have the password, please contact the hospital operator. Locate the [facility] provider you are looking for under [facility] and page to a number that you can be directly reached. If you still have difficulty reaching the provider, please page the [facility] (Director on Call) for the Hospitalists listed on amion for assistance.

## 2023-07-21 NOTE — TOC Transition Note (Signed)
 Transition of Care [facility]) - Discharge Note   Patient Details  Name: Courtney [name] MRN: [id number] Date of Birth: 06-02-1945  [facility]) CM/SW Contact:  Isabella Bowens, LCSWA Phone Number: 07/21/2023, 2:16 PM   Clinical Narrative:    CSW spoke with [name] at [place] [facility] and shared that MD is discharging patient today . [name] stated that she would need the DC summary and FL2, Which was sent via HUB.  CSW then called daughter and shared with her that patient is medically ready for DC and will return back to [facility]. Daughter in agreement since MD stated that she was medically ready . MD also recommended in house palliative. Daughter was given a choice and agreeable to [facility]. Morrie Sheldon with [facility] spoke with family at bedside and was able to get things started . Daughter chose to start with hospice at [facility] . MD made aware and placed order.   Med necessity form completed and EMS has been called.    Final next level of care: Memory Care Barriers to Discharge: Barriers Resolved   Patient Goals and CMS Choice Patient states their goals for this hospitalization and ongoing recovery are:: return back to [place] [facility] : Memory Care CMS Medicare.gov Compare Post Acute Care list provided to:: Patient Represenative (must comment) ([name]- daughter) Choice offered to / list presented to : [facility] POA / Guardian, Spouse      Discharge Placement                Patient to be transferred to facility by: Ambulance Name of family member notified: [name]- Daughter Patient and family notified of of transfer: 07/21/23  Discharge Plan and Services Additional resources added to the After Visit Summary for   In-house Referral: Hospice / Palliative Care, Clinical Social Work   Post Acute Care Choice: Durable Medical Equipment          DME Arranged: N/A DME Agency: NA         HH Agency: NA        Social Drivers of Health (SDOH) Interventions SDOH Screenings   Food  Insecurity: No Food Insecurity (07/20/2023)  Housing: Low Risk  (07/20/2023)  Transportation Needs: No Transportation Needs (07/20/2023)  Utilities: Not At Risk (07/20/2023)  Depression (PHQ2-9): Low Risk  (05/15/2018)  Social Connections: Socially Isolated (07/20/2023)  Tobacco Use: Medium Risk (07/20/2023)     Readmission Risk Interventions    07/21/2023    2:04 PM 07/21/2023    1:53 PM 07/21/2023    1:29 PM  Readmission Risk Prevention Plan  Transportation Screening Complete Complete Complete  HRI or Home Care Consult Complete Complete Complete  Social Work Consult for Recovery Care Planning/Counseling Complete Complete Complete  Palliative Care Screening Complete Complete Complete  Medication Review Oceanographer) Complete Complete Complete

## 2023-07-21 NOTE — Progress Notes (Signed)
 2120 transfer ambulance service here pt awake pure wick out depends on daughter at bedside vss no changes from this afternoon report to receiving nurse. 2125 transferred via stretcher with ambulance team

## 2023-07-21 NOTE — [place] FL2 (Signed)
 [place] MEDICAID FL2 LEVEL OF CARE FORM     IDENTIFICATION  Patient Name: Courtney [name] Birthdate: Dec 14, 1945 Sex: female Admission Date (Current Location): 07/19/2023  [employer] and [place] Number:  [employer] and Address:  [facility],  [address] [place] [address]      Provider Number: [id number]  Attending Physician Name and Address:  Cleora Fleet, MD  Relative Name and Phone Number:  Nelta Numbers ( daughter ) [phone]    Current Level of Care: Hospital Recommended Level of Care: Other (Comment) (memory care) Prior Approval Number:    Date Approved/Denied:   PASRR Number:    Discharge Plan: Other (Comment) (memory care)    Current Diagnoses: Patient Active Problem List   Diagnosis Date Noted   Influenza A 07/19/2023   Acute encephalopathy 07/19/2023   Dyslipidemia 07/19/2023   Type 2 diabetes mellitus with chronic kidney disease, with long-term current use of insulin (HCC) 07/19/2023   Essential hypertension 07/19/2023   Acute on chronic diastolic CHF (congestive heart failure) (HCC) 07/19/2023   Other hyperparathyroidism (HCC) 09/22/2022   Low folic acid 10/02/2020   Foot fracture, right, sequela 10/02/2020   Hypertension associated with stage 3b chronic kidney disease due to type 2 diabetes mellitus (HCC) 09/30/2020   Stage 3 chronic kidney disease due to type 2 diabetes mellitus (HCC) 09/30/2020   Hyperlipidemia associated with type 2 diabetes mellitus (HCC) 09/30/2020   Primary osteoarthritis of knees, bilateral 09/30/2020   Major depression, recurrent, chronic (HCC) 09/30/2020   Psychosis in elderly without behavioral disturbance (HCC) 09/30/2020   Vitamin B12 deficiency 09/30/2020   Bilateral lower extremity edema 09/30/2020   Anemia associated with diabetes mellitus due to underlying condition (HCC) 09/30/2020   History of CVA (cerebrovascular accident) 09/30/2020   Alzheimer's dementia with behavioral  disturbance (HCC) 09/30/2020   AKI (acute kidney injury) (HCC) 09/27/2020   CVA (cerebral vascular accident) (HCC) 08/21/2019   Controlled type 2 diabetes mellitus with neurologic complication, with long-term current use of insulin (HCC) 08/21/2019   Hypothyroidism 01/23/2019   HTN (hypertension) 01/23/2019    Orientation RESPIRATION BLADDER Height & Weight     Self  Normal Incontinent Weight: 208 lb (94.3 kg) Height:  5\' 1"  (154.9 cm)  BEHAVIORAL SYMPTOMS/MOOD NEUROLOGICAL BOWEL NUTRITION STATUS      Incontinent Diet (heart healthy)  AMBULATORY STATUS COMMUNICATION OF NEEDS Skin   Extensive Assist Verbally Normal                       Personal Care Assistance Level of Assistance  Bathing, Feeding, Dressing Bathing Assistance: Maximum assistance Feeding assistance: Independent Dressing Assistance: Maximum assistance     Functional Limitations Info  Sight, Hearing, Speech Sight Info: Adequate Hearing Info: Adequate Speech Info: Adequate    SPECIAL CARE FACTORS FREQUENCY                       Contractures Contractures Info: Not present    Additional Factors Info  Code Status, Allergies, Psychotropic Code Status Info: DNR-Limited Allergies Info: NKA Psychotropic Info: Quetiapine and Sertraline         Current Medications (07/21/2023):  This is the current hospital active medication list Current Facility-Administered Medications  Medication Dose Route Frequency Provider Last Rate Last Admin   0.9 %  sodium chloride infusion   Intravenous Continuous Laural Benes, Clanford L, MD 70 mL/hr at 07/20/23 1838 Infusion Verify at 07/20/23 1838   acetaminophen (TYLENOL) tablet  650 mg  650 mg Oral Q6H PRN Mansy, Jan A, MD       Or   acetaminophen (TYLENOL) suppository 650 mg  650 mg Rectal Q6H PRN Mansy, Jan A, MD   650 mg at 07/20/23 0217   aspirin EC tablet 81 mg  81 mg Oral Daily Johnson, Clanford L, MD   81 mg at 07/21/23 4098   atorvastatin (LIPITOR) tablet 40 mg   40 mg Oral q morning Mansy, Jan A, MD   40 mg at 07/21/23 0830   calcitRIOL (ROCALTROL) capsule 0.25 mcg  0.25 mcg Oral Q lunch Mansy, Jan A, MD   0.25 mcg at 07/21/23 1157   cholecalciferol (VITAMIN D3) 25 MCG (1000 UNIT) tablet 1,000 Units  1,000 Units Oral Daily Mansy, Jan A, MD   1,000 Units at 07/21/23 1191   cyanocobalamin (VITAMIN B12) tablet 1,000 mcg  1,000 mcg Oral Daily Mansy, Jan A, MD   1,000 mcg at 07/21/23 0830   donepezil (ARICEPT) tablet 5 mg  5 mg Oral Daily Mansy, Jan A, MD   5 mg at 07/21/23 0830   enoxaparin (LOVENOX) injection 30 mg  30 mg Subcutaneous Q24H Mansy, Jan A, MD   30 mg at 07/21/23 4782   ferrous sulfate tablet 325 mg  325 mg Oral Q breakfast Mansy, Jan A, MD   325 mg at 07/21/23 0830   folic acid (FOLVITE) tablet 1 mg  1 mg Oral BID Mansy, Jan A, MD   1 mg at 07/21/23 0830   insulin aspart (novoLOG) injection 0-6 Units  0-6 Units Subcutaneous Q4H Johnson, Clanford L, MD       insulin glargine-yfgn (SEMGLEE) injection 10 Units  10 Units Subcutaneous QHS Johnson, Clanford L, MD   10 Units at 07/20/23 2248   labetalol (NORMODYNE) tablet 300 mg  300 mg Oral BID Mansy, Jan A, MD   300 mg at 07/21/23 9562   levothyroxine (SYNTHROID) tablet 175 mcg  175 mcg Oral Daily Mansy, Jan A, MD   175 mcg at 07/21/23 1308   linagliptin (TRADJENTA) tablet 5 mg  5 mg Oral Daily Mansy, Jan A, MD   5 mg at 07/21/23 6578   magnesium hydroxide (MILK OF MAGNESIA) suspension 30 mL  30 mL Oral Daily PRN Mansy, Jan A, MD       memantine [facility]) tablet 5 mg  5 mg Oral BID Mansy, Jan A, MD   5 mg at 07/21/23 0830   ondansetron (ZOFRAN) tablet 4 mg  4 mg Oral Q6H PRN Mansy, Jan A, MD       Or   ondansetron [facility]) injection 4 mg  4 mg Intravenous Q6H PRN Mansy, Jan A, MD       oseltamivir (TAMIFLU) capsule 30 mg  30 mg Oral Daily Johnson, Clanford L, MD   30 mg at 07/21/23 4696   QUEtiapine (SEROQUEL) tablet 25 mg  25 mg Oral BID Mansy, Jan A, MD   25 mg at 07/21/23 0830   sertraline  (ZOLOFT) tablet 50 mg  50 mg Oral Daily Mansy, Jan A, MD   50 mg at 07/21/23 0830   traZODone (DESYREL) tablet 25 mg  25 mg Oral QHS PRN Mansy, Vernetta Honey, MD         Discharge Medications: Allergies as of 07/21/2023   No Known Allergies      Medication List     STOP taking these medications    cephALEXin 500 MG capsule Commonly known as: KEFLEX  TAKE these medications    acetaminophen 325 MG tablet Commonly known as: TYLENOL Take 2 tablets (650 mg total) by mouth every 6 (six) hours as needed for mild pain, fever or headache (or Fever >/= 101).   atorvastatin 40 MG tablet Commonly known as: LIPITOR Take 1 tablet (40 mg total) by mouth every morning.   calcitRIOL 0.25 MCG capsule Commonly known as: ROCALTROL Take 1 capsule (0.25 mcg total) by mouth daily with lunch.   cholecalciferol 25 MCG (1000 UNIT) tablet Commonly known as: VITAMIN D3 Take 1,000 Units by mouth daily.   donepezil 5 MG tablet Commonly known as: ARICEPT Take 5 mg by mouth daily.   ferrous sulfate 325 (65 FE) MG tablet Take 1 tablet (325 mg total) by mouth daily with breakfast. What changed: when to take this   folic acid 1 MG tablet Commonly known as: FOLVITE Take 1 mg by mouth 2 (two) times daily.   furosemide 40 MG tablet Commonly known as: LASIX Take 1 tablet (40 mg total) by mouth every other day. HOLD IF NOT EATING OR DRINKING WELL. Start taking on: July 24, 2023 What changed:  when to take this additional instructions These instructions start on July 24, 2023. If you are unsure what to do until then, ask your doctor or other care provider.   labetalol 300 MG tablet Commonly known as: NORMODYNE Take 300 mg by mouth 2 (two) times daily.   Lantus SoloStar 100 UNIT/ML Solostar Pen Generic drug: insulin glargine Inject 12 Units into the skin at bedtime.   levothyroxine 175 MCG tablet Commonly known as: SYNTHROID Take 175 mcg by mouth daily.   loperamide 2 MG tablet Commonly  known as: IMODIUM A-D Take 2 mg by mouth as needed for diarrhea or loose stools.   memantine 5 MG tablet Commonly known as: NAMENDA Take 5 mg by mouth 2 (two) times daily.   oseltamivir 30 MG capsule Commonly known as: TAMIFLU Take 1 capsule (30 mg total) by mouth daily for 3 days. Start taking on: July 22, 2023   polyethylene glycol powder 17 GM/SCOOP powder Commonly known as: GLYCOLAX/MIRALAX Take 1 Container by mouth daily. Mix 1 capful with 4-8 ounces of water and drink by mouth once daily for constipation   QUEtiapine 25 MG tablet Commonly known as: SEROQUEL Take 1 tablet (25 mg total) by mouth 2 (two) times daily.   sertraline 50 MG tablet Commonly known as: ZOLOFT Take 50 mg by mouth daily.   Tradjenta 5 MG Tabs tablet Generic drug: linagliptin Take 5 mg by mouth daily.   UNABLE TO FIND Diet - NAS, ConCHO   [place] 81 MG Caps Generic drug: Aspirin Take by mouth.         Relevant Imaging Results:  Relevant Lab Results:   Additional Information SSN: [id number] [place]

## 2023-07-21 NOTE — Progress Notes (Signed)
 [facility] Liaison Note  Received request hospice services at facility after discharge. Spoke with [name], daughter, to initiate education related to hospice philosophy, services, and team approach to care. Patient/family verbalized understanding of information given. Per discussion, the plan is for discharge to facility likely today.    DME needs discussed. Patient has no DME in the home. Patient/family requests the following equipment for delivery: wheelchair, and our nurse to assess for additional needs. The address has been verified and is correct in the chart. [name] is the family contact to arrange time of equipment delivery.  Please send signed and completed DNR home with patient/family. Please provide prescriptions at discharge as needed to ensure ongoing symptom management.   [facility] information and contact numbers given to [name]. Please call with any questions or concerns.   Thank you for the opportunity to participate in this patient's care.   Glenna Fellows BSN, Charity fundraiser, OCN [employer] [phone]

## 2023-07-21 NOTE — Discharge Instructions (Addendum)
 The recommended isolation period for a nursing home resident with influenza is typically 5 days after the onset of symptoms.      IMPORTANT INFORMATION: PAY CLOSE ATTENTION   PHYSICIAN DISCHARGE INSTRUCTIONS  Follow with Primary care provider  Emiliano Dyer, FNP  and other consultants as instructed by your Hospitalist Physician  SEEK MEDICAL CARE OR RETURN TO EMERGENCY ROOM IF SYMPTOMS COME BACK, WORSEN OR NEW PROBLEM DEVELOPS   Please note: You were cared for by a hospitalist during your hospital stay. Every effort will be made to forward records to your primary care provider.  You can request that your primary care provider send for your hospital records if they have not received them.  Once you are discharged, your primary care physician will handle any further medical issues. Please note that NO REFILLS for any discharge medications will be authorized once you are discharged, as it is imperative that you return to your primary care physician (or establish a relationship with a primary care physician if you do not have one) for your post hospital discharge needs so that they can reassess your need for medications and monitor your lab values.  Please get a complete blood count and chemistry panel checked by your Primary MD at your next visit, and again as instructed by your Primary MD.  Get Medicines reviewed and adjusted: Please take all your medications with you for your next visit with your Primary MD  Laboratory/radiological data: Please request your Primary MD to go over all hospital tests and procedure/radiological results at the follow up, please ask your primary care provider to get all Hospital records sent to his/her office.  In some cases, they will be blood work, cultures and biopsy results pending at the time of your discharge. Please request that your primary care provider follow up on these results.  If you are diabetic, please bring your blood sugar readings with you  to your follow up appointment with primary care.    Please call and make your follow up appointments as soon as possible.    Also Note the following: If you experience worsening of your admission symptoms, develop shortness of breath, life threatening emergency, suicidal or homicidal thoughts you must seek medical attention immediately by calling 911 or calling your MD immediately  if symptoms less severe.  You must read complete instructions/literature along with all the possible adverse reactions/side effects for all the Medicines you take and that have been prescribed to you. Take any new Medicines after you have completely understood and accpet all the possible adverse reactions/side effects.   Do not drive when taking Pain medications or sleeping medications (Benzodiazepines)  Do not take more than prescribed Pain, Sleep and Anxiety Medications. It is not advisable to combine anxiety,sleep and pain medications without talking with your primary care practitioner  Special Instructions: If you have smoked or chewed Tobacco  in the last 2 yrs please stop smoking, stop any regular Alcohol  and or any Recreational drug use.  Wear Seat belts while driving.  Do not drive if taking any narcotic, mind altering or controlled substances or recreational drugs or alcohol.

## 2023-07-21 NOTE — Care Management Important Message (Signed)
 Important Message  Patient Details  Name: Courtney [name] MRN: [id number] Date of Birth: 03-17-1946   Important Message Given:  N/A - LOS <3 / Initial given by admissions     Corey Harold 07/21/2023, 1:24 PM

## 2023-07-21 NOTE — Plan of Care (Signed)
  Problem: Acute Rehab PT Goals(only PT should resolve) Goal: Pt Will Go Supine/Side To Sit 07/21/2023 1157 by [name], Student-PT Outcome: Progressing Flowsheets (Taken 07/21/2023 1156) Pt will go Supine/Side to Sit:  with moderate assist  with HOB elevated   Problem: Acute Rehab PT Goals(only PT should resolve) Goal: Patient Will Transfer Sit To/From Stand 07/21/2023 1157 by [name], Student-PT Outcome: Progressing Flowsheets (Taken 07/21/2023 1156) Patient will transfer sit to/from stand: with moderate assist   Problem: Acute Rehab PT Goals(only PT should resolve) Goal: Pt Will Transfer Bed To Chair/Chair To Bed 07/21/2023 1157 by [name], Student-PT Outcome: Progressing Flowsheets (Taken 07/21/2023 1156) Pt will Transfer Bed to Chair/Chair to Bed: with mod assist   Problem: Acute Rehab PT Goals(only PT should resolve) Goal: Pt Will Ambulate 07/21/2023 1157 by [name], Student-PT Outcome: Progressing Flowsheets (Taken 07/21/2023 1156) Pt will Ambulate:  10 feet  with moderate assist  with maximum assist  with rolling walker    Courtney [name] Antoney Biven SPT

## 2023-07-21 NOTE — Evaluation (Signed)
 Physical Therapy Evaluation Patient Details Name: Courtney [name] MRN: [id number] DOB: 06/19/1945 Today's Date: 07/21/2023  History of Present Illness  Courtney [name] is a 78 y.o. Caucasian female with medical history significant for Alzheimer's disease with behavioral changes, type 2 diabetes mellitus, with stage IIIb chronic kidney disease, hypertension, depression and dyslipidemia, who presented to the emergency room with acute onset of altered mental status with decreased responsiveness and suspected shortness of breath.  No fever or chills.  No nausea or vomiting or abdominal pain were reported.  No reported chest pain or palpitations.  She has been having congested cough today without wheezing.  No reported fever or chills per her husband but she had fever here in the ER tonight.  She was seen in the ER with decreased responsiveness 4 days ago and had workup including labs and head CT scan that came back unremarkable.  She was given empiric therapy for UTI but a urine sample was not obtained as she refused.   Clinical Impression  Patient was agreeable to therapy. Patient was max for all tasks assessed. Extra cueing provided to initiate movements. Was able to use RW and stand at bed side. Stand pivot transfer performed as patient was unable to take steps and maintain balance while standing for extended period of time. Patient limited by generalized weakness and fatigue. Left in chair at conclusion of session. Patient will benefit from continued skilled physical therapy in hospital and recommended venue below to increase strength, balance, endurance for safe ADLs and gait.          If plan is discharge home, recommend the following: A lot of help with bathing/dressing/bathroom;A lot of help with walking and/or transfers;Assistance with cooking/housework;Help with stairs or ramp for entrance   Can travel by private vehicle        Equipment Recommendations None recommended by PT  Recommendations  for Other Services       Functional Status Assessment Patient has had a recent decline in their functional status and demonstrates the ability to make significant improvements in function in a reasonable and predictable amount of time.     Precautions / Restrictions Precautions Precautions: Fall Restrictions Weight Bearing Restrictions Per Provider Order: No      Mobility  Bed Mobility Overal bed mobility: Needs Assistance Bed Mobility: Supine to Sit     Supine to sit: Max assist, HOB elevated       Patient Response: Cooperative  Transfers Overall transfer level: Needs assistance Equipment used: Rolling walker (2 wheels) Transfers: Sit to/from Stand, Bed to chair/wheelchair/BSC Sit to Stand: Max assist Stand pivot transfers: Max assist              Ambulation/Gait               General Gait Details: not assessed for patient safety  Stairs            Wheelchair Mobility     Tilt Bed Tilt Bed Patient Response: Cooperative  Modified Rankin (Stroke Patients Only)       Balance Overall balance assessment: Needs assistance Sitting-balance support: Feet supported, Bilateral upper extremity supported Sitting balance-Leahy Scale: Fair Sitting balance - Comments: EOB   Standing balance support: Bilateral upper extremity supported, During functional activity, Reliant on assistive device for balance Standing balance-Leahy Scale: Poor Standing balance comment: poor/fair use RW  Pertinent Vitals/Pain Pain Assessment Pain Assessment: No/denies pain    Home Living Family/patient expects to be discharged to:: Skilled nursing facility                        Prior Function Prior Level of Function : Needs assist       Physical Assist : Mobility (physical);ADLs (physical) Mobility (physical): Bed mobility;Transfers;Gait ADLs (physical): Bathing;Dressing;Toileting Mobility Comments: facility helps  and patient uses wheelchair ADLs Comments: facility helps with it all, husband helps as well     Extremity/Trunk Assessment   Upper Extremity Assessment Upper Extremity Assessment: Defer to OT evaluation    Lower Extremity Assessment Lower Extremity Assessment: Generalized weakness    Cervical / Trunk Assessment Cervical / Trunk Assessment: Normal  Communication   Communication Communication: No apparent difficulties    Cognition Arousal: Alert Behavior During Therapy: WFL for tasks assessed/performed   PT - Cognitive impairments: History of cognitive impairments                       PT - Cognition Comments: alzheimer's Following commands: Intact       Cueing Cueing Techniques: Verbal cues, Tactile cues     General Comments      Exercises     Assessment/Plan    PT Assessment Patient needs continued PT services  PT Problem List Decreased strength;Decreased range of motion;Decreased activity tolerance;Decreased balance;Decreased mobility;Decreased coordination       PT Treatment Interventions DME instruction;Gait training;Patient/family education;Stair training;Functional mobility training;Therapeutic activities;Therapeutic exercise;Balance training    PT Goals (Current goals can be found in the Care Plan section)  Acute Rehab PT Goals Patient Stated Goal: to return home PT Goal Formulation: With patient Time For Goal Achievement: 08/04/23 Potential to Achieve Goals: Good    Frequency Min 3X/week     Co-evaluation               AM-PAC PT "6 Clicks" Mobility  Outcome Measure Help needed turning from your back to your side while in a flat bed without using bedrails?: A Lot Help needed moving from lying on your back to sitting on the side of a flat bed without using bedrails?: A Lot Help needed moving to and from a bed to a chair (including a wheelchair)?: A Lot Help needed standing up from a chair using your arms (e.g., wheelchair or  bedside chair)?: A Lot Help needed to walk in hospital room?: Total Help needed climbing 3-5 steps with a railing? : Total 6 Click Score: 10    End of Session Equipment Utilized During Treatment: Gait belt Activity Tolerance: Patient tolerated treatment well Patient left: in chair;with call bell/phone within reach;with chair alarm set;with family/visitor present Nurse Communication: Mobility status PT Visit Diagnosis: Unsteadiness on feet (R26.81);Other abnormalities of gait and mobility (R26.89);Muscle weakness (generalized) (M62.81)    Time: 1610-9604 PT Time Calculation (min) (ACUTE ONLY): 33 min   Charges:   PT Evaluation $PT Eval Moderate Complexity: 1 Mod PT Treatments $Therapeutic Activity: 23-37 mins PT General Charges $$ ACUTE PT VISIT: 1 Visit         Shadie Sweatman SPT

## 2023-07-24 DIAGNOSIS — E119 Type 2 diabetes mellitus without complications: Secondary | ICD-10-CM | POA: Diagnosis not present

## 2023-07-24 DIAGNOSIS — M6281 Muscle weakness (generalized): Secondary | ICD-10-CM | POA: Diagnosis not present

## 2023-07-24 DIAGNOSIS — E039 Hypothyroidism, unspecified: Secondary | ICD-10-CM | POA: Diagnosis not present

## 2023-07-24 DIAGNOSIS — I10 Essential (primary) hypertension: Secondary | ICD-10-CM | POA: Diagnosis not present

## 2023-07-24 DIAGNOSIS — R279 Unspecified lack of coordination: Secondary | ICD-10-CM | POA: Diagnosis not present

## 2023-07-24 DIAGNOSIS — F0390 Unspecified dementia without behavioral disturbance: Secondary | ICD-10-CM | POA: Diagnosis not present

## 2023-07-24 DIAGNOSIS — N189 Chronic kidney disease, unspecified: Secondary | ICD-10-CM | POA: Diagnosis not present

## 2023-07-24 DIAGNOSIS — E785 Hyperlipidemia, unspecified: Secondary | ICD-10-CM | POA: Diagnosis not present

## 2023-07-24 LAB — CULTURE, BLOOD (ROUTINE X 2)
Culture: NO GROWTH
Culture: NO GROWTH
Special Requests: ADEQUATE

## 2023-07-26 DIAGNOSIS — R279 Unspecified lack of coordination: Secondary | ICD-10-CM | POA: Diagnosis not present

## 2023-07-26 DIAGNOSIS — E782 Mixed hyperlipidemia: Secondary | ICD-10-CM | POA: Diagnosis not present

## 2023-07-26 DIAGNOSIS — E119 Type 2 diabetes mellitus without complications: Secondary | ICD-10-CM | POA: Diagnosis not present

## 2023-07-26 DIAGNOSIS — D519 Vitamin B12 deficiency anemia, unspecified: Secondary | ICD-10-CM | POA: Diagnosis not present

## 2023-07-26 DIAGNOSIS — N189 Chronic kidney disease, unspecified: Secondary | ICD-10-CM | POA: Diagnosis not present

## 2023-07-26 DIAGNOSIS — E559 Vitamin D deficiency, unspecified: Secondary | ICD-10-CM | POA: Diagnosis not present

## 2023-07-26 DIAGNOSIS — I10 Essential (primary) hypertension: Secondary | ICD-10-CM | POA: Diagnosis not present

## 2023-07-26 DIAGNOSIS — Z79899 Other long term (current) drug therapy: Secondary | ICD-10-CM | POA: Diagnosis not present

## 2023-07-26 DIAGNOSIS — F0390 Unspecified dementia without behavioral disturbance: Secondary | ICD-10-CM | POA: Diagnosis not present

## 2023-07-26 DIAGNOSIS — E039 Hypothyroidism, unspecified: Secondary | ICD-10-CM | POA: Diagnosis not present

## 2023-07-26 DIAGNOSIS — M6281 Muscle weakness (generalized): Secondary | ICD-10-CM | POA: Diagnosis not present

## 2023-07-26 DIAGNOSIS — E038 Other specified hypothyroidism: Secondary | ICD-10-CM | POA: Diagnosis not present

## 2023-07-26 DIAGNOSIS — E785 Hyperlipidemia, unspecified: Secondary | ICD-10-CM | POA: Diagnosis not present

## 2023-07-27 DIAGNOSIS — J111 Influenza due to unidentified influenza virus with other respiratory manifestations: Secondary | ICD-10-CM | POA: Diagnosis not present

## 2023-07-27 DIAGNOSIS — R739 Hyperglycemia, unspecified: Secondary | ICD-10-CM | POA: Diagnosis not present

## 2023-07-27 DIAGNOSIS — N390 Urinary tract infection, site not specified: Secondary | ICD-10-CM | POA: Diagnosis not present

## 2023-07-31 DIAGNOSIS — E119 Type 2 diabetes mellitus without complications: Secondary | ICD-10-CM | POA: Diagnosis not present

## 2023-07-31 DIAGNOSIS — F0390 Unspecified dementia without behavioral disturbance: Secondary | ICD-10-CM | POA: Diagnosis not present

## 2023-07-31 DIAGNOSIS — E039 Hypothyroidism, unspecified: Secondary | ICD-10-CM | POA: Diagnosis not present

## 2023-07-31 DIAGNOSIS — R279 Unspecified lack of coordination: Secondary | ICD-10-CM | POA: Diagnosis not present

## 2023-07-31 DIAGNOSIS — N189 Chronic kidney disease, unspecified: Secondary | ICD-10-CM | POA: Diagnosis not present

## 2023-07-31 DIAGNOSIS — M6281 Muscle weakness (generalized): Secondary | ICD-10-CM | POA: Diagnosis not present

## 2023-07-31 DIAGNOSIS — I10 Essential (primary) hypertension: Secondary | ICD-10-CM | POA: Diagnosis not present

## 2023-07-31 DIAGNOSIS — E785 Hyperlipidemia, unspecified: Secondary | ICD-10-CM | POA: Diagnosis not present

## 2023-08-02 DIAGNOSIS — E119 Type 2 diabetes mellitus without complications: Secondary | ICD-10-CM | POA: Diagnosis not present

## 2023-08-02 DIAGNOSIS — E039 Hypothyroidism, unspecified: Secondary | ICD-10-CM | POA: Diagnosis not present

## 2023-08-02 DIAGNOSIS — I10 Essential (primary) hypertension: Secondary | ICD-10-CM | POA: Diagnosis not present

## 2023-08-02 DIAGNOSIS — F0390 Unspecified dementia without behavioral disturbance: Secondary | ICD-10-CM | POA: Diagnosis not present

## 2023-08-02 DIAGNOSIS — R279 Unspecified lack of coordination: Secondary | ICD-10-CM | POA: Diagnosis not present

## 2023-08-02 DIAGNOSIS — E785 Hyperlipidemia, unspecified: Secondary | ICD-10-CM | POA: Diagnosis not present

## 2023-08-02 DIAGNOSIS — M6281 Muscle weakness (generalized): Secondary | ICD-10-CM | POA: Diagnosis not present

## 2023-08-02 DIAGNOSIS — N189 Chronic kidney disease, unspecified: Secondary | ICD-10-CM | POA: Diagnosis not present

## 2023-08-04 DIAGNOSIS — B1710 Acute hepatitis C without hepatic coma: Secondary | ICD-10-CM | POA: Diagnosis not present

## 2023-08-04 DIAGNOSIS — E119 Type 2 diabetes mellitus without complications: Secondary | ICD-10-CM | POA: Diagnosis not present

## 2023-08-04 DIAGNOSIS — D519 Vitamin B12 deficiency anemia, unspecified: Secondary | ICD-10-CM | POA: Diagnosis not present

## 2023-08-04 DIAGNOSIS — E038 Other specified hypothyroidism: Secondary | ICD-10-CM | POA: Diagnosis not present

## 2023-08-04 DIAGNOSIS — E782 Mixed hyperlipidemia: Secondary | ICD-10-CM | POA: Diagnosis not present

## 2023-08-04 DIAGNOSIS — I10 Essential (primary) hypertension: Secondary | ICD-10-CM | POA: Diagnosis not present

## 2023-08-04 DIAGNOSIS — E559 Vitamin D deficiency, unspecified: Secondary | ICD-10-CM | POA: Diagnosis not present

## 2023-08-04 DIAGNOSIS — I70223 Atherosclerosis of native arteries of extremities with rest pain, bilateral legs: Secondary | ICD-10-CM | POA: Diagnosis not present

## 2023-08-08 DIAGNOSIS — M6281 Muscle weakness (generalized): Secondary | ICD-10-CM | POA: Diagnosis not present

## 2023-08-08 DIAGNOSIS — I129 Hypertensive chronic kidney disease with stage 1 through stage 4 chronic kidney disease, or unspecified chronic kidney disease: Secondary | ICD-10-CM | POA: Diagnosis not present

## 2023-08-08 DIAGNOSIS — F01B4 Vascular dementia, moderate, with anxiety: Secondary | ICD-10-CM | POA: Diagnosis not present

## 2023-08-08 DIAGNOSIS — R278 Other lack of coordination: Secondary | ICD-10-CM | POA: Diagnosis not present

## 2023-08-08 DIAGNOSIS — I10 Essential (primary) hypertension: Secondary | ICD-10-CM | POA: Diagnosis not present

## 2023-08-10 DIAGNOSIS — M6281 Muscle weakness (generalized): Secondary | ICD-10-CM | POA: Diagnosis not present

## 2023-08-10 DIAGNOSIS — R278 Other lack of coordination: Secondary | ICD-10-CM | POA: Diagnosis not present

## 2023-08-10 DIAGNOSIS — F01B4 Vascular dementia, moderate, with anxiety: Secondary | ICD-10-CM | POA: Diagnosis not present

## 2023-08-10 DIAGNOSIS — I129 Hypertensive chronic kidney disease with stage 1 through stage 4 chronic kidney disease, or unspecified chronic kidney disease: Secondary | ICD-10-CM | POA: Diagnosis not present

## 2023-08-10 DIAGNOSIS — I10 Essential (primary) hypertension: Secondary | ICD-10-CM | POA: Diagnosis not present

## 2023-08-15 DIAGNOSIS — M6281 Muscle weakness (generalized): Secondary | ICD-10-CM | POA: Diagnosis not present

## 2023-08-15 DIAGNOSIS — R278 Other lack of coordination: Secondary | ICD-10-CM | POA: Diagnosis not present

## 2023-08-15 DIAGNOSIS — F01B4 Vascular dementia, moderate, with anxiety: Secondary | ICD-10-CM | POA: Diagnosis not present

## 2023-08-15 DIAGNOSIS — I129 Hypertensive chronic kidney disease with stage 1 through stage 4 chronic kidney disease, or unspecified chronic kidney disease: Secondary | ICD-10-CM | POA: Diagnosis not present

## 2023-08-15 DIAGNOSIS — I10 Essential (primary) hypertension: Secondary | ICD-10-CM | POA: Diagnosis not present

## 2023-08-17 DIAGNOSIS — I129 Hypertensive chronic kidney disease with stage 1 through stage 4 chronic kidney disease, or unspecified chronic kidney disease: Secondary | ICD-10-CM | POA: Diagnosis not present

## 2023-08-17 DIAGNOSIS — M6281 Muscle weakness (generalized): Secondary | ICD-10-CM | POA: Diagnosis not present

## 2023-08-17 DIAGNOSIS — F01B4 Vascular dementia, moderate, with anxiety: Secondary | ICD-10-CM | POA: Diagnosis not present

## 2023-08-17 DIAGNOSIS — R278 Other lack of coordination: Secondary | ICD-10-CM | POA: Diagnosis not present

## 2023-08-17 DIAGNOSIS — I10 Essential (primary) hypertension: Secondary | ICD-10-CM | POA: Diagnosis not present

## 2023-08-22 DIAGNOSIS — M6281 Muscle weakness (generalized): Secondary | ICD-10-CM | POA: Diagnosis not present

## 2023-08-22 DIAGNOSIS — I10 Essential (primary) hypertension: Secondary | ICD-10-CM | POA: Diagnosis not present

## 2023-08-22 DIAGNOSIS — I129 Hypertensive chronic kidney disease with stage 1 through stage 4 chronic kidney disease, or unspecified chronic kidney disease: Secondary | ICD-10-CM | POA: Diagnosis not present

## 2023-08-22 DIAGNOSIS — F01B4 Vascular dementia, moderate, with anxiety: Secondary | ICD-10-CM | POA: Diagnosis not present

## 2023-08-22 DIAGNOSIS — R278 Other lack of coordination: Secondary | ICD-10-CM | POA: Diagnosis not present

## 2023-08-24 DIAGNOSIS — F01B4 Vascular dementia, moderate, with anxiety: Secondary | ICD-10-CM | POA: Diagnosis not present

## 2023-08-24 DIAGNOSIS — N1832 Chronic kidney disease, stage 3b: Secondary | ICD-10-CM | POA: Diagnosis not present

## 2023-08-24 DIAGNOSIS — R278 Other lack of coordination: Secondary | ICD-10-CM | POA: Diagnosis not present

## 2023-08-24 DIAGNOSIS — R739 Hyperglycemia, unspecified: Secondary | ICD-10-CM | POA: Diagnosis not present

## 2023-08-24 DIAGNOSIS — D508 Other iron deficiency anemias: Secondary | ICD-10-CM | POA: Diagnosis not present

## 2023-08-24 DIAGNOSIS — M1990 Unspecified osteoarthritis, unspecified site: Secondary | ICD-10-CM | POA: Diagnosis not present

## 2023-08-24 DIAGNOSIS — M6281 Muscle weakness (generalized): Secondary | ICD-10-CM | POA: Diagnosis not present

## 2023-08-24 DIAGNOSIS — E559 Vitamin D deficiency, unspecified: Secondary | ICD-10-CM | POA: Diagnosis not present

## 2023-08-24 DIAGNOSIS — E119 Type 2 diabetes mellitus without complications: Secondary | ICD-10-CM | POA: Diagnosis not present

## 2023-08-24 DIAGNOSIS — E785 Hyperlipidemia, unspecified: Secondary | ICD-10-CM | POA: Diagnosis not present

## 2023-08-24 DIAGNOSIS — I10 Essential (primary) hypertension: Secondary | ICD-10-CM | POA: Diagnosis not present

## 2023-08-24 DIAGNOSIS — I129 Hypertensive chronic kidney disease with stage 1 through stage 4 chronic kidney disease, or unspecified chronic kidney disease: Secondary | ICD-10-CM | POA: Diagnosis not present

## 2023-08-28 DIAGNOSIS — M79672 Pain in left foot: Secondary | ICD-10-CM | POA: Diagnosis not present

## 2023-08-28 DIAGNOSIS — M79671 Pain in right foot: Secondary | ICD-10-CM | POA: Diagnosis not present

## 2023-08-28 DIAGNOSIS — I83893 Varicose veins of bilateral lower extremities with other complications: Secondary | ICD-10-CM | POA: Diagnosis not present

## 2023-08-28 DIAGNOSIS — L601 Onycholysis: Secondary | ICD-10-CM | POA: Diagnosis not present

## 2023-08-28 DIAGNOSIS — I872 Venous insufficiency (chronic) (peripheral): Secondary | ICD-10-CM | POA: Diagnosis not present

## 2023-08-28 DIAGNOSIS — E1140 Type 2 diabetes mellitus with diabetic neuropathy, unspecified: Secondary | ICD-10-CM | POA: Diagnosis not present

## 2023-08-28 DIAGNOSIS — R234 Changes in skin texture: Secondary | ICD-10-CM | POA: Diagnosis not present

## 2023-08-28 DIAGNOSIS — B351 Tinea unguium: Secondary | ICD-10-CM | POA: Diagnosis not present

## 2023-08-28 DIAGNOSIS — L600 Ingrowing nail: Secondary | ICD-10-CM | POA: Diagnosis not present

## 2023-08-28 DIAGNOSIS — I998 Other disorder of circulatory system: Secondary | ICD-10-CM | POA: Diagnosis not present

## 2023-08-29 DIAGNOSIS — R278 Other lack of coordination: Secondary | ICD-10-CM | POA: Diagnosis not present

## 2023-08-29 DIAGNOSIS — F01B4 Vascular dementia, moderate, with anxiety: Secondary | ICD-10-CM | POA: Diagnosis not present

## 2023-08-29 DIAGNOSIS — M6281 Muscle weakness (generalized): Secondary | ICD-10-CM | POA: Diagnosis not present

## 2023-08-29 DIAGNOSIS — I10 Essential (primary) hypertension: Secondary | ICD-10-CM | POA: Diagnosis not present

## 2023-08-29 DIAGNOSIS — I129 Hypertensive chronic kidney disease with stage 1 through stage 4 chronic kidney disease, or unspecified chronic kidney disease: Secondary | ICD-10-CM | POA: Diagnosis not present

## 2023-08-31 DIAGNOSIS — F01B4 Vascular dementia, moderate, with anxiety: Secondary | ICD-10-CM | POA: Diagnosis not present

## 2023-08-31 DIAGNOSIS — I129 Hypertensive chronic kidney disease with stage 1 through stage 4 chronic kidney disease, or unspecified chronic kidney disease: Secondary | ICD-10-CM | POA: Diagnosis not present

## 2023-08-31 DIAGNOSIS — M6281 Muscle weakness (generalized): Secondary | ICD-10-CM | POA: Diagnosis not present

## 2023-08-31 DIAGNOSIS — R278 Other lack of coordination: Secondary | ICD-10-CM | POA: Diagnosis not present

## 2023-08-31 DIAGNOSIS — I10 Essential (primary) hypertension: Secondary | ICD-10-CM | POA: Diagnosis not present

## 2023-10-26 DIAGNOSIS — F01B4 Vascular dementia, moderate, with anxiety: Secondary | ICD-10-CM | POA: Diagnosis not present

## 2023-10-26 DIAGNOSIS — R278 Other lack of coordination: Secondary | ICD-10-CM | POA: Diagnosis not present

## 2023-10-26 DIAGNOSIS — I10 Essential (primary) hypertension: Secondary | ICD-10-CM | POA: Diagnosis not present

## 2023-10-26 DIAGNOSIS — M6281 Muscle weakness (generalized): Secondary | ICD-10-CM | POA: Diagnosis not present

## 2023-10-26 DIAGNOSIS — F32A Depression, unspecified: Secondary | ICD-10-CM | POA: Diagnosis not present

## 2023-10-26 DIAGNOSIS — N1832 Chronic kidney disease, stage 3b: Secondary | ICD-10-CM | POA: Diagnosis not present

## 2023-10-26 DIAGNOSIS — D508 Other iron deficiency anemias: Secondary | ICD-10-CM | POA: Diagnosis not present

## 2023-10-26 DIAGNOSIS — M1990 Unspecified osteoarthritis, unspecified site: Secondary | ICD-10-CM | POA: Diagnosis not present

## 2023-10-26 DIAGNOSIS — E559 Vitamin D deficiency, unspecified: Secondary | ICD-10-CM | POA: Diagnosis not present

## 2023-10-26 DIAGNOSIS — R739 Hyperglycemia, unspecified: Secondary | ICD-10-CM | POA: Diagnosis not present

## 2023-10-26 DIAGNOSIS — E785 Hyperlipidemia, unspecified: Secondary | ICD-10-CM | POA: Diagnosis not present

## 2023-10-26 DIAGNOSIS — I129 Hypertensive chronic kidney disease with stage 1 through stage 4 chronic kidney disease, or unspecified chronic kidney disease: Secondary | ICD-10-CM | POA: Diagnosis not present

## 2023-10-28 DIAGNOSIS — E785 Hyperlipidemia, unspecified: Secondary | ICD-10-CM | POA: Diagnosis not present

## 2023-10-28 DIAGNOSIS — D508 Other iron deficiency anemias: Secondary | ICD-10-CM | POA: Diagnosis not present

## 2023-10-28 DIAGNOSIS — E559 Vitamin D deficiency, unspecified: Secondary | ICD-10-CM | POA: Diagnosis not present

## 2023-10-28 DIAGNOSIS — N1832 Chronic kidney disease, stage 3b: Secondary | ICD-10-CM | POA: Diagnosis not present

## 2023-10-28 DIAGNOSIS — M1990 Unspecified osteoarthritis, unspecified site: Secondary | ICD-10-CM | POA: Diagnosis not present

## 2023-10-28 DIAGNOSIS — E119 Type 2 diabetes mellitus without complications: Secondary | ICD-10-CM | POA: Diagnosis not present

## 2023-10-28 DIAGNOSIS — I10 Essential (primary) hypertension: Secondary | ICD-10-CM | POA: Diagnosis not present

## 2023-10-30 DIAGNOSIS — M6281 Muscle weakness (generalized): Secondary | ICD-10-CM | POA: Diagnosis not present

## 2023-10-30 DIAGNOSIS — I10 Essential (primary) hypertension: Secondary | ICD-10-CM | POA: Diagnosis not present

## 2023-10-30 DIAGNOSIS — F01B4 Vascular dementia, moderate, with anxiety: Secondary | ICD-10-CM | POA: Diagnosis not present

## 2023-10-30 DIAGNOSIS — R278 Other lack of coordination: Secondary | ICD-10-CM | POA: Diagnosis not present

## 2023-10-30 DIAGNOSIS — I129 Hypertensive chronic kidney disease with stage 1 through stage 4 chronic kidney disease, or unspecified chronic kidney disease: Secondary | ICD-10-CM | POA: Diagnosis not present

## 2023-11-02 DIAGNOSIS — I10 Essential (primary) hypertension: Secondary | ICD-10-CM | POA: Diagnosis not present

## 2023-11-02 DIAGNOSIS — I129 Hypertensive chronic kidney disease with stage 1 through stage 4 chronic kidney disease, or unspecified chronic kidney disease: Secondary | ICD-10-CM | POA: Diagnosis not present

## 2023-11-02 DIAGNOSIS — R278 Other lack of coordination: Secondary | ICD-10-CM | POA: Diagnosis not present

## 2023-11-02 DIAGNOSIS — F01B4 Vascular dementia, moderate, with anxiety: Secondary | ICD-10-CM | POA: Diagnosis not present

## 2023-11-02 DIAGNOSIS — M6281 Muscle weakness (generalized): Secondary | ICD-10-CM | POA: Diagnosis not present

## 2023-11-07 DIAGNOSIS — M6281 Muscle weakness (generalized): Secondary | ICD-10-CM | POA: Diagnosis not present

## 2023-11-07 DIAGNOSIS — F01B4 Vascular dementia, moderate, with anxiety: Secondary | ICD-10-CM | POA: Diagnosis not present

## 2023-11-07 DIAGNOSIS — R278 Other lack of coordination: Secondary | ICD-10-CM | POA: Diagnosis not present

## 2023-11-07 DIAGNOSIS — I129 Hypertensive chronic kidney disease with stage 1 through stage 4 chronic kidney disease, or unspecified chronic kidney disease: Secondary | ICD-10-CM | POA: Diagnosis not present

## 2023-11-07 DIAGNOSIS — I10 Essential (primary) hypertension: Secondary | ICD-10-CM | POA: Diagnosis not present

## 2023-11-09 DIAGNOSIS — R278 Other lack of coordination: Secondary | ICD-10-CM | POA: Diagnosis not present

## 2023-11-09 DIAGNOSIS — I129 Hypertensive chronic kidney disease with stage 1 through stage 4 chronic kidney disease, or unspecified chronic kidney disease: Secondary | ICD-10-CM | POA: Diagnosis not present

## 2023-11-09 DIAGNOSIS — F01B4 Vascular dementia, moderate, with anxiety: Secondary | ICD-10-CM | POA: Diagnosis not present

## 2023-11-09 DIAGNOSIS — I10 Essential (primary) hypertension: Secondary | ICD-10-CM | POA: Diagnosis not present

## 2023-11-09 DIAGNOSIS — M6281 Muscle weakness (generalized): Secondary | ICD-10-CM | POA: Diagnosis not present

## 2023-11-14 DIAGNOSIS — I129 Hypertensive chronic kidney disease with stage 1 through stage 4 chronic kidney disease, or unspecified chronic kidney disease: Secondary | ICD-10-CM | POA: Diagnosis not present

## 2023-11-14 DIAGNOSIS — I10 Essential (primary) hypertension: Secondary | ICD-10-CM | POA: Diagnosis not present

## 2023-11-14 DIAGNOSIS — R278 Other lack of coordination: Secondary | ICD-10-CM | POA: Diagnosis not present

## 2023-11-14 DIAGNOSIS — M6281 Muscle weakness (generalized): Secondary | ICD-10-CM | POA: Diagnosis not present

## 2023-11-14 DIAGNOSIS — F01B4 Vascular dementia, moderate, with anxiety: Secondary | ICD-10-CM | POA: Diagnosis not present

## 2023-11-16 DIAGNOSIS — I129 Hypertensive chronic kidney disease with stage 1 through stage 4 chronic kidney disease, or unspecified chronic kidney disease: Secondary | ICD-10-CM | POA: Diagnosis not present

## 2023-11-16 DIAGNOSIS — R278 Other lack of coordination: Secondary | ICD-10-CM | POA: Diagnosis not present

## 2023-11-16 DIAGNOSIS — F01B4 Vascular dementia, moderate, with anxiety: Secondary | ICD-10-CM | POA: Diagnosis not present

## 2023-11-16 DIAGNOSIS — M6281 Muscle weakness (generalized): Secondary | ICD-10-CM | POA: Diagnosis not present

## 2023-11-16 DIAGNOSIS — I10 Essential (primary) hypertension: Secondary | ICD-10-CM | POA: Diagnosis not present

## 2023-11-17 DIAGNOSIS — E1165 Type 2 diabetes mellitus with hyperglycemia: Secondary | ICD-10-CM | POA: Diagnosis not present

## 2023-11-23 DIAGNOSIS — N1832 Chronic kidney disease, stage 3b: Secondary | ICD-10-CM | POA: Diagnosis not present

## 2023-11-23 DIAGNOSIS — E1165 Type 2 diabetes mellitus with hyperglycemia: Secondary | ICD-10-CM | POA: Diagnosis not present

## 2024-02-08 DIAGNOSIS — E559 Vitamin D deficiency, unspecified: Secondary | ICD-10-CM | POA: Diagnosis not present

## 2024-02-08 DIAGNOSIS — F32A Depression, unspecified: Secondary | ICD-10-CM | POA: Diagnosis not present

## 2024-02-08 DIAGNOSIS — N1832 Chronic kidney disease, stage 3b: Secondary | ICD-10-CM | POA: Diagnosis not present

## 2024-02-08 DIAGNOSIS — E1165 Type 2 diabetes mellitus with hyperglycemia: Secondary | ICD-10-CM | POA: Diagnosis not present

## 2024-02-08 DIAGNOSIS — E785 Hyperlipidemia, unspecified: Secondary | ICD-10-CM | POA: Diagnosis not present

## 2024-02-08 DIAGNOSIS — D508 Other iron deficiency anemias: Secondary | ICD-10-CM | POA: Diagnosis not present

## 2024-02-08 DIAGNOSIS — I10 Essential (primary) hypertension: Secondary | ICD-10-CM | POA: Diagnosis not present

## 2024-03-23 DEATH — deceased
